# Patient Record
Sex: Female | Born: 1985 | Race: White | Hispanic: No | Marital: Married | Smoking: Never smoker
Health system: Southern US, Community
[De-identification: ages and names within clinical notes are randomized; demographics above are authoritative.]

## PROBLEM LIST (undated history)

## (undated) DIAGNOSIS — Z803 Family history of malignant neoplasm of breast: Secondary | ICD-10-CM

## (undated) DIAGNOSIS — D649 Anemia, unspecified: Secondary | ICD-10-CM

## (undated) DIAGNOSIS — F329 Major depressive disorder, single episode, unspecified: Secondary | ICD-10-CM

## (undated) DIAGNOSIS — J189 Pneumonia, unspecified organism: Secondary | ICD-10-CM

## (undated) DIAGNOSIS — C801 Malignant (primary) neoplasm, unspecified: Secondary | ICD-10-CM

## (undated) DIAGNOSIS — R569 Unspecified convulsions: Secondary | ICD-10-CM

## (undated) DIAGNOSIS — F32A Depression, unspecified: Secondary | ICD-10-CM

## (undated) DIAGNOSIS — R519 Headache, unspecified: Secondary | ICD-10-CM

## (undated) DIAGNOSIS — R091 Pleurisy: Secondary | ICD-10-CM

## (undated) DIAGNOSIS — F419 Anxiety disorder, unspecified: Secondary | ICD-10-CM

## (undated) HISTORY — PX: NO PAST SURGERIES: SHX2092

## (undated) HISTORY — DX: Pleurisy: R09.1

## (undated) HISTORY — PX: BREAST SURGERY: SHX581

## (undated) HISTORY — DX: Family history of malignant neoplasm of breast: Z80.3

---
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
## 2004-12-19 ENCOUNTER — Emergency Department (HOSPITAL_COMMUNITY): Admission: EM | Admit: 2004-12-19 | Discharge: 2004-12-19 | Payer: Self-pay | Admitting: Emergency Medicine

## 2004-12-26 ENCOUNTER — Emergency Department (HOSPITAL_COMMUNITY): Admission: EM | Admit: 2004-12-26 | Discharge: 2004-12-26 | Payer: Self-pay | Admitting: Emergency Medicine

## 2005-03-12 ENCOUNTER — Emergency Department: Payer: Self-pay | Admitting: Emergency Medicine

## 2005-06-03 ENCOUNTER — Emergency Department: Payer: Self-pay | Admitting: Emergency Medicine

## 2005-07-10 ENCOUNTER — Emergency Department: Payer: Self-pay | Admitting: Emergency Medicine

## 2005-07-26 ENCOUNTER — Emergency Department: Payer: Self-pay | Admitting: Emergency Medicine

## 2005-07-26 ENCOUNTER — Other Ambulatory Visit: Payer: Self-pay

## 2005-08-13 ENCOUNTER — Emergency Department: Payer: Self-pay | Admitting: Emergency Medicine

## 2005-08-19 ENCOUNTER — Emergency Department: Payer: Self-pay | Admitting: Emergency Medicine

## 2005-10-09 ENCOUNTER — Emergency Department (HOSPITAL_COMMUNITY): Admission: EM | Admit: 2005-10-09 | Discharge: 2005-10-09 | Payer: Self-pay | Admitting: Emergency Medicine

## 2005-12-02 ENCOUNTER — Other Ambulatory Visit: Payer: Self-pay

## 2005-12-02 ENCOUNTER — Emergency Department: Payer: Self-pay | Admitting: Emergency Medicine

## 2005-12-05 ENCOUNTER — Ambulatory Visit: Payer: Self-pay | Admitting: Emergency Medicine

## 2006-02-05 ENCOUNTER — Emergency Department: Payer: Self-pay | Admitting: Emergency Medicine

## 2006-02-10 ENCOUNTER — Emergency Department: Payer: Self-pay | Admitting: General Practice

## 2006-02-10 ENCOUNTER — Other Ambulatory Visit: Payer: Self-pay

## 2006-04-14 ENCOUNTER — Other Ambulatory Visit: Payer: Self-pay

## 2006-04-14 ENCOUNTER — Emergency Department: Payer: Self-pay | Admitting: Emergency Medicine

## 2006-05-05 ENCOUNTER — Emergency Department: Payer: Self-pay | Admitting: Emergency Medicine

## 2006-05-05 ENCOUNTER — Other Ambulatory Visit: Payer: Self-pay

## 2006-07-10 ENCOUNTER — Other Ambulatory Visit: Payer: Self-pay

## 2006-07-10 ENCOUNTER — Emergency Department: Payer: Self-pay | Admitting: Emergency Medicine

## 2006-08-01 ENCOUNTER — Emergency Department (HOSPITAL_COMMUNITY): Admission: EM | Admit: 2006-08-01 | Discharge: 2006-08-01 | Payer: Self-pay | Admitting: Emergency Medicine

## 2006-08-15 ENCOUNTER — Emergency Department (HOSPITAL_COMMUNITY): Admission: EM | Admit: 2006-08-15 | Discharge: 2006-08-15 | Payer: Self-pay | Admitting: Emergency Medicine

## 2006-08-15 ENCOUNTER — Ambulatory Visit: Payer: Self-pay | Admitting: *Deleted

## 2006-08-15 ENCOUNTER — Ambulatory Visit: Payer: Self-pay | Admitting: Family Medicine

## 2006-08-30 ENCOUNTER — Emergency Department: Payer: Self-pay | Admitting: Emergency Medicine

## 2006-09-01 ENCOUNTER — Ambulatory Visit: Payer: Self-pay | Admitting: Family Medicine

## 2006-09-12 ENCOUNTER — Ambulatory Visit: Payer: Self-pay | Admitting: Family Medicine

## 2006-09-15 ENCOUNTER — Ambulatory Visit (HOSPITAL_COMMUNITY): Admission: RE | Admit: 2006-09-15 | Discharge: 2006-09-15 | Payer: Self-pay | Admitting: Family Medicine

## 2006-09-26 ENCOUNTER — Ambulatory Visit: Payer: Self-pay | Admitting: Family Medicine

## 2006-11-16 ENCOUNTER — Ambulatory Visit: Payer: Self-pay | Admitting: Family Medicine

## 2006-12-11 ENCOUNTER — Ambulatory Visit: Payer: Self-pay | Admitting: Family Medicine

## 2006-12-20 ENCOUNTER — Emergency Department: Payer: Self-pay | Admitting: Emergency Medicine

## 2007-01-01 ENCOUNTER — Ambulatory Visit: Payer: Self-pay | Admitting: Family Medicine

## 2007-01-02 ENCOUNTER — Ambulatory Visit (HOSPITAL_COMMUNITY): Admission: RE | Admit: 2007-01-02 | Discharge: 2007-01-02 | Payer: Self-pay | Admitting: Family Medicine

## 2007-01-16 ENCOUNTER — Ambulatory Visit: Payer: Self-pay | Admitting: Family Medicine

## 2007-02-02 ENCOUNTER — Emergency Department: Payer: Self-pay

## 2007-02-12 ENCOUNTER — Ambulatory Visit: Payer: Self-pay | Admitting: Family Medicine

## 2007-03-13 ENCOUNTER — Ambulatory Visit: Payer: Self-pay | Admitting: Family Medicine

## 2007-06-04 ENCOUNTER — Ambulatory Visit: Payer: Self-pay | Admitting: Internal Medicine

## 2007-07-14 ENCOUNTER — Emergency Department: Payer: Self-pay | Admitting: Emergency Medicine

## 2007-07-17 ENCOUNTER — Ambulatory Visit: Payer: Self-pay | Admitting: Family Medicine

## 2007-07-19 ENCOUNTER — Ambulatory Visit (HOSPITAL_COMMUNITY): Admission: RE | Admit: 2007-07-19 | Discharge: 2007-07-19 | Payer: Self-pay | Admitting: Family Medicine

## 2007-08-15 ENCOUNTER — Encounter (INDEPENDENT_AMBULATORY_CARE_PROVIDER_SITE_OTHER): Payer: Self-pay | Admitting: *Deleted

## 2007-08-27 ENCOUNTER — Emergency Department: Payer: Self-pay | Admitting: Emergency Medicine

## 2007-08-27 ENCOUNTER — Other Ambulatory Visit: Payer: Self-pay

## 2007-12-21 ENCOUNTER — Emergency Department: Payer: Self-pay | Admitting: Internal Medicine

## 2008-01-14 ENCOUNTER — Ambulatory Visit: Payer: Self-pay | Admitting: Internal Medicine

## 2008-02-15 ENCOUNTER — Emergency Department: Payer: Self-pay | Admitting: Emergency Medicine

## 2008-03-04 ENCOUNTER — Emergency Department: Payer: Self-pay | Admitting: Emergency Medicine

## 2008-03-04 ENCOUNTER — Other Ambulatory Visit: Payer: Self-pay

## 2008-03-18 ENCOUNTER — Ambulatory Visit: Payer: Self-pay | Admitting: Family Medicine

## 2008-04-08 ENCOUNTER — Ambulatory Visit: Payer: Self-pay | Admitting: Internal Medicine

## 2008-04-08 ENCOUNTER — Encounter (INDEPENDENT_AMBULATORY_CARE_PROVIDER_SITE_OTHER): Payer: Self-pay | Admitting: Family Medicine

## 2008-04-08 LAB — CONVERTED CEMR LAB
Chlamydia, DNA Probe: NEGATIVE
HCT: 39.2 % (ref 36.0–46.0)
Hemoglobin: 12.9 g/dL (ref 12.0–15.0)
Lymphocytes Relative: 49 % — ABNORMAL HIGH (ref 12–46)
Lymphs Abs: 1.4 10*3/uL (ref 0.7–4.0)
MCV: 90.3 fL (ref 78.0–100.0)
Monocytes Relative: 8 % (ref 3–12)
Neutro Abs: 1.1 10*3/uL — ABNORMAL LOW (ref 1.7–7.7)
Platelets: 205 10*3/uL (ref 150–400)
RBC: 4.34 M/uL (ref 3.87–5.11)
Vit D, 1,25-Dihydroxy: 34 (ref 30–89)

## 2008-04-24 ENCOUNTER — Ambulatory Visit: Payer: Self-pay | Admitting: Internal Medicine

## 2008-08-25 ENCOUNTER — Emergency Department: Payer: Self-pay | Admitting: Emergency Medicine

## 2008-10-26 ENCOUNTER — Emergency Department: Payer: Self-pay

## 2008-11-23 ENCOUNTER — Emergency Department: Payer: Self-pay | Admitting: Emergency Medicine

## 2009-01-01 ENCOUNTER — Emergency Department: Payer: Self-pay | Admitting: Emergency Medicine

## 2009-01-15 ENCOUNTER — Emergency Department: Payer: Self-pay | Admitting: Emergency Medicine

## 2009-06-21 ENCOUNTER — Emergency Department: Payer: Self-pay | Admitting: Emergency Medicine

## 2009-06-22 ENCOUNTER — Encounter: Payer: Self-pay | Admitting: Obstetrics and Gynecology

## 2009-07-31 ENCOUNTER — Emergency Department: Payer: Self-pay | Admitting: Emergency Medicine

## 2009-08-28 ENCOUNTER — Emergency Department: Payer: Self-pay | Admitting: Emergency Medicine

## 2009-09-24 ENCOUNTER — Observation Stay: Payer: Self-pay | Admitting: Obstetrics and Gynecology

## 2009-10-05 ENCOUNTER — Observation Stay: Payer: Self-pay

## 2009-10-20 ENCOUNTER — Encounter: Payer: Self-pay | Admitting: Pediatric Cardiology

## 2009-11-02 ENCOUNTER — Emergency Department: Payer: Self-pay | Admitting: Emergency Medicine

## 2009-11-23 ENCOUNTER — Emergency Department: Payer: Self-pay | Admitting: Emergency Medicine

## 2009-12-26 ENCOUNTER — Observation Stay: Payer: Self-pay

## 2010-01-20 ENCOUNTER — Observation Stay: Payer: Self-pay

## 2010-01-25 ENCOUNTER — Observation Stay: Payer: Self-pay

## 2010-01-30 ENCOUNTER — Inpatient Hospital Stay: Payer: Self-pay | Admitting: Obstetrics & Gynecology

## 2010-06-08 ENCOUNTER — Emergency Department: Payer: Self-pay | Admitting: Emergency Medicine

## 2010-10-13 ENCOUNTER — Emergency Department: Payer: Self-pay | Admitting: Emergency Medicine

## 2011-04-15 NOTE — Procedures (Signed)
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  Seizures were occurring more frequently.  Study is being  done to look for the presence of a seizure disorder.  780.39.   PROCEDURE:  The tracing is carried out of 32 channel digital Cadwell  recorder reformatted into 16 channel montages with one devoted to EKG.  The  patient was sleep deprived for the record, remained awake and drowsy during  the recording.  Medications include Dilantin.   DESCRIPTION/FINDINGS:  Dominant frequency is a 10 Hz 35-40 microvolt  activity that is well regulated and attenuates partially with eye opening.   BACKGROUND ACTIVITY:  Is predominately alpha and theta range activity with  frontally predominant beta range components.   The patient becomes to drowsy with mixed frequency rhythmic theta that at  times is generalized.   Activating procedures with photic stimulation induced driving response  between 11 and 13 Hz.  EKG showed regular sinus rhythm with ventricular  response of 78 beats per minute.   IMPRESSION:  Normal record with the patient awake and drowsy.      Latoya Luna. Sharene Skeans, M.D.  Electronically Signed     ZDG:LOVF  D:  09/15/2006 20:52:19  T:  09/17/2006 10:53:31  Job #:  [id number]   cc:   Maurice March, M.D.  Fax: [phone]
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
## 2011-08-25 ENCOUNTER — Emergency Department: Payer: Self-pay | Admitting: Internal Medicine

## 2011-09-25 ENCOUNTER — Emergency Department: Payer: Self-pay | Admitting: Emergency Medicine

## 2012-02-01 ENCOUNTER — Emergency Department: Payer: Self-pay | Admitting: Emergency Medicine

## 2012-02-13 ENCOUNTER — Emergency Department: Payer: Self-pay | Admitting: Emergency Medicine

## 2012-05-07 ENCOUNTER — Emergency Department: Payer: Self-pay | Admitting: Emergency Medicine

## 2012-05-07 LAB — COMPREHENSIVE METABOLIC PANEL
Albumin: 3.7 g/dL (ref 3.4–5.0)
BUN: 10 mg/dL (ref 7–18)
Bilirubin,Total: 0.2 mg/dL (ref 0.2–1.0)
Calcium, Total: 8.7 mg/dL (ref 8.5–10.1)
Chloride: 104 mmol/L (ref 98–107)
Osmolality: 277 (ref 275–301)
Potassium: 3.3 mmol/L — ABNORMAL LOW (ref 3.5–5.1)
SGOT(AST): 18 U/L (ref 15–37)
Sodium: 140 mmol/L (ref 136–145)
Total Protein: 7 g/dL (ref 6.4–8.2)

## 2012-05-07 LAB — URINALYSIS, COMPLETE
Bacteria: NONE SEEN
Bilirubin,UR: NEGATIVE
Glucose,UR: NEGATIVE mg/dL (ref 0–75)
Leukocyte Esterase: NEGATIVE
Protein: NEGATIVE
RBC,UR: <1 /HPF (ref 0–5)
Squamous Epithelial: 1

## 2012-05-07 LAB — DRUG SCREEN, URINE
Barbiturates, Ur Screen: NEGATIVE (ref ?–200)
Cannabinoid 50 Ng, Ur ~~LOC~~: NEGATIVE (ref ?–50)
Cocaine Metabolite,Ur ~~LOC~~: NEGATIVE (ref ?–300)
MDMA (Ecstasy)Ur Screen: NEGATIVE (ref ?–500)
Methadone, Ur Screen: NEGATIVE (ref ?–300)
Opiate, Ur Screen: NEGATIVE (ref ?–300)

## 2012-05-07 LAB — CBC
HGB: 12.9 g/dL (ref 12.0–16.0)
MCH: 31.2 pg (ref 26.0–34.0)
MCHC: 33.7 g/dL (ref 32.0–36.0)
MCV: 93 fL (ref 80–100)
Platelet: 199 10*3/uL (ref 150–440)
RBC: 4.14 10*6/uL (ref 3.80–5.20)

## 2012-05-07 LAB — WET PREP, GENITAL

## 2012-07-12 ENCOUNTER — Emergency Department: Payer: Self-pay | Admitting: Emergency Medicine

## 2012-07-12 LAB — COMPREHENSIVE METABOLIC PANEL
Albumin: 2.9 g/dL — ABNORMAL LOW (ref 3.4–5.0)
Anion Gap: 8 (ref 7–16)
Calcium, Total: 8.1 mg/dL — ABNORMAL LOW (ref 8.5–10.1)
EGFR (African American): >60
EGFR (Non-African Amer.): >60
Glucose: 73 mg/dL (ref 65–99)
Potassium: 3.6 mmol/L (ref 3.5–5.1)
SGOT(AST): 22 U/L (ref 15–37)
SGPT (ALT): 18 U/L (ref 12–78)

## 2012-07-12 LAB — URINALYSIS, COMPLETE
Ketone: NEGATIVE
Ph: 8 (ref 4.5–8.0)
Protein: NEGATIVE
Specific Gravity: 1.003 (ref 1.003–1.030)

## 2012-07-12 LAB — PROTIME-INR
INR: 1
Prothrombin Time: 13.2 secs (ref 11.5–14.7)

## 2012-07-12 LAB — CBC
HCT: 33.8 % — ABNORMAL LOW (ref 35.0–47.0)
HGB: 11.7 g/dL — ABNORMAL LOW (ref 12.0–16.0)
MCH: 31.9 pg (ref 26.0–34.0)
MCHC: 34.7 g/dL (ref 32.0–36.0)
RBC: 3.67 10*6/uL — ABNORMAL LOW (ref 3.80–5.20)
WBC: 6.3 10*3/uL (ref 3.6–11.0)

## 2012-08-27 ENCOUNTER — Observation Stay: Payer: Self-pay

## 2012-08-27 LAB — URINALYSIS, COMPLETE
Bilirubin,UR: NEGATIVE
Blood: NEGATIVE
Ketone: NEGATIVE
Ph: 8 (ref 4.5–8.0)
Specific Gravity: 1.011 (ref 1.003–1.030)
Squamous Epithelial: 2

## 2012-09-14 ENCOUNTER — Observation Stay: Payer: Self-pay | Admitting: Emergency Medicine

## 2012-09-14 LAB — CBC
HGB: 12.1 g/dL (ref 12.0–16.0)
MCH: 32.9 pg (ref 26.0–34.0)
MCV: 93 fL (ref 80–100)
Platelet: 187 10*3/uL (ref 150–440)
RBC: 3.69 10*6/uL — ABNORMAL LOW (ref 3.80–5.20)
WBC: 6.8 10*3/uL (ref 3.6–11.0)

## 2012-09-14 LAB — URINALYSIS, COMPLETE
Ketone: NEGATIVE
Ph: 8 (ref 4.5–8.0)
Protein: NEGATIVE
RBC,UR: <1 /HPF (ref 0–5)

## 2012-09-14 LAB — COMPREHENSIVE METABOLIC PANEL
Albumin: 2.8 g/dL — ABNORMAL LOW (ref 3.4–5.0)
Alkaline Phosphatase: 59 U/L (ref 50–136)
Bilirubin,Total: 0.2 mg/dL (ref 0.2–1.0)
Calcium, Total: 8.2 mg/dL — ABNORMAL LOW (ref 8.5–10.1)
Co2: 28 mmol/L (ref 21–32)
EGFR (African American): >60
EGFR (Non-African Amer.): >60
Glucose: 73 mg/dL (ref 65–99)
SGOT(AST): 12 U/L — ABNORMAL LOW (ref 15–37)
SGPT (ALT): 19 U/L (ref 12–78)
Sodium: 141 mmol/L (ref 136–145)

## 2012-10-16 ENCOUNTER — Observation Stay: Payer: Self-pay

## 2012-11-22 ENCOUNTER — Observation Stay: Payer: Self-pay | Admitting: Emergency Medicine

## 2012-11-26 ENCOUNTER — Observation Stay: Payer: Self-pay | Admitting: Obstetrics and Gynecology

## 2012-11-29 ENCOUNTER — Observation Stay: Payer: Self-pay | Admitting: Advanced Practice Midwife

## 2012-12-04 ENCOUNTER — Inpatient Hospital Stay: Payer: Self-pay | Admitting: Obstetrics and Gynecology

## 2012-12-04 LAB — CBC WITH DIFFERENTIAL/PLATELET
Eosinophil #: 0.1 10*3/uL (ref 0.0–0.7)
Eosinophil %: 0.9 %
Lymphocyte %: 17.4 %
MCV: 90 fL (ref 80–100)
Monocyte %: 7.7 %
Neutrophil %: 73.6 %
Platelet: 175 10*3/uL (ref 150–440)

## 2012-12-05 LAB — HEMATOCRIT: HCT: 30.8 % — ABNORMAL LOW (ref 35.0–47.0)

## 2013-09-26 ENCOUNTER — Emergency Department: Payer: Self-pay | Admitting: Emergency Medicine

## 2013-09-26 LAB — CBC
HCT: 38.5 % (ref 35.0–47.0)
HGB: 13.3 g/dL (ref 12.0–16.0)
Platelet: 198 10*3/uL (ref 150–440)
RBC: 4.27 10*6/uL (ref 3.80–5.20)

## 2013-09-26 LAB — BASIC METABOLIC PANEL
BUN: 14 mg/dL (ref 7–18)
Osmolality: 277 (ref 275–301)

## 2013-10-09 ENCOUNTER — Emergency Department: Payer: Self-pay

## 2013-10-09 LAB — BASIC METABOLIC PANEL
BUN: 16 mg/dL (ref 7–18)
Calcium, Total: 8.8 mg/dL (ref 8.5–10.1)
Chloride: 104 mmol/L (ref 98–107)
Creatinine: 0.59 mg/dL — ABNORMAL LOW (ref 0.60–1.30)
Glucose: 120 mg/dL — ABNORMAL HIGH (ref 65–99)
Sodium: 136 mmol/L (ref 136–145)

## 2013-10-09 LAB — CBC WITH DIFFERENTIAL/PLATELET
Eosinophil %: 1.6 %
HCT: 38.3 % (ref 35.0–47.0)
HGB: 13 g/dL (ref 12.0–16.0)
Lymphocyte %: 9.1 %
MCV: 91 fL (ref 80–100)
Monocyte #: 0.7 x10 3/mm (ref 0.2–0.9)
Monocyte %: 6 %
Neutrophil #: 9.9 10*3/uL — ABNORMAL HIGH (ref 1.4–6.5)
Neutrophil %: 83 %
WBC: 11.9 10*3/uL — ABNORMAL HIGH (ref 3.6–11.0)

## 2013-11-02 ENCOUNTER — Emergency Department: Payer: Self-pay | Admitting: Emergency Medicine

## 2013-11-02 LAB — COMPREHENSIVE METABOLIC PANEL
Albumin: 3.9 g/dL (ref 3.4–5.0)
Anion Gap: 6 — ABNORMAL LOW (ref 7–16)
BUN: 14 mg/dL (ref 7–18)
Bilirubin,Total: 0.3 mg/dL (ref 0.2–1.0)
Calcium, Total: 8.9 mg/dL (ref 8.5–10.1)
Glucose: 85 mg/dL (ref 65–99)
Potassium: 3.8 mmol/L (ref 3.5–5.1)
SGOT(AST): 12 U/L — ABNORMAL LOW (ref 15–37)
SGPT (ALT): 23 U/L (ref 12–78)

## 2013-11-02 LAB — URINALYSIS, COMPLETE
Bilirubin,UR: NEGATIVE
Glucose,UR: NEGATIVE mg/dL (ref 0–75)
Ketone: NEGATIVE
Nitrite: NEGATIVE
Ph: 7 (ref 4.5–8.0)
Specific Gravity: 1.018 (ref 1.003–1.030)
WBC UR: NONE SEEN /HPF (ref 0–5)

## 2013-11-02 LAB — CBC
HGB: 12.9 g/dL (ref 12.0–16.0)
MCH: 30.7 pg (ref 26.0–34.0)
MCV: 90 fL (ref 80–100)
RBC: 4.22 10*6/uL (ref 3.80–5.20)
RDW: 12.6 % (ref 11.5–14.5)
WBC: 5.3 10*3/uL (ref 3.6–11.0)

## 2014-02-27 ENCOUNTER — Emergency Department: Payer: Self-pay | Admitting: Emergency Medicine

## 2014-02-27 LAB — URINALYSIS, COMPLETE
BACTERIA: NONE SEEN
BILIRUBIN, UR: NEGATIVE
BLOOD: NEGATIVE
Glucose,UR: NEGATIVE mg/dL (ref 0–75)
Ketone: NEGATIVE
Leukocyte Esterase: NEGATIVE
Nitrite: NEGATIVE
Ph: 8 (ref 4.5–8.0)
Protein: NEGATIVE
Specific Gravity: 1.01 (ref 1.003–1.030)
WBC UR: 1 /HPF (ref 0–5)

## 2014-02-27 LAB — BASIC METABOLIC PANEL
Anion Gap: 7 (ref 7–16)
BUN: 9 mg/dL (ref 7–18)
CALCIUM: 8.7 mg/dL (ref 8.5–10.1)
CREATININE: 0.55 mg/dL — AB (ref 0.60–1.30)
Chloride: 105 mmol/L (ref 98–107)
Co2: 28 mmol/L (ref 21–32)
GLUCOSE: 100 mg/dL — AB (ref 65–99)
OSMOLALITY: 278 (ref 275–301)
POTASSIUM: 3.8 mmol/L (ref 3.5–5.1)
SODIUM: 140 mmol/L (ref 136–145)

## 2014-02-27 LAB — CBC WITH DIFFERENTIAL/PLATELET
Basophil #: 0 10*3/uL (ref 0.0–0.1)
Basophil %: 0.6 %
EOS ABS: 0.1 10*3/uL (ref 0.0–0.7)
Eosinophil %: 0.9 %
HCT: 39.7 % (ref 35.0–47.0)
HGB: 13.2 g/dL (ref 12.0–16.0)
Lymphocyte #: 1.3 10*3/uL (ref 1.0–3.6)
Lymphocyte %: 23.1 %
MCH: 30.5 pg (ref 26.0–34.0)
MCHC: 33.3 g/dL (ref 32.0–36.0)
MCV: 92 fL (ref 80–100)
MONO ABS: 0.4 x10 3/mm (ref 0.2–0.9)
MONOS PCT: 6.3 %
Neutrophil #: 3.9 10*3/uL (ref 1.4–6.5)
Neutrophil %: 69.1 %
Platelet: 184 10*3/uL (ref 150–440)
RBC: 4.34 10*6/uL (ref 3.80–5.20)
RDW: 13.3 % (ref 11.5–14.5)
WBC: 5.7 10*3/uL (ref 3.6–11.0)

## 2014-03-29 LAB — OB RESULTS CONSOLE GBS: GBS: POSITIVE

## 2014-08-20 ENCOUNTER — Emergency Department: Payer: Self-pay | Admitting: Emergency Medicine

## 2014-08-20 LAB — COMPREHENSIVE METABOLIC PANEL
ALT: 26 U/L
ANION GAP: 7 (ref 7–16)
Albumin: 3.9 g/dL (ref 3.4–5.0)
Alkaline Phosphatase: 41 U/L — ABNORMAL LOW
BILIRUBIN TOTAL: 0.4 mg/dL (ref 0.2–1.0)
BUN: 13 mg/dL (ref 7–18)
Calcium, Total: 8.4 mg/dL — ABNORMAL LOW (ref 8.5–10.1)
Chloride: 104 mmol/L (ref 98–107)
Co2: 27 mmol/L (ref 21–32)
Creatinine: 0.58 mg/dL — ABNORMAL LOW (ref 0.60–1.30)
EGFR (African American): >60
EGFR (Non-African Amer.): >60
GLUCOSE: 77 mg/dL (ref 65–99)
OSMOLALITY: 275 (ref 275–301)
POTASSIUM: 3.6 mmol/L (ref 3.5–5.1)
SGOT(AST): 20 U/L (ref 15–37)
Sodium: 138 mmol/L (ref 136–145)
Total Protein: 7.2 g/dL (ref 6.4–8.2)

## 2014-08-20 LAB — URINALYSIS, COMPLETE
BACTERIA: NONE SEEN
BILIRUBIN, UR: NEGATIVE
Blood: NEGATIVE
GLUCOSE, UR: NEGATIVE mg/dL (ref 0–75)
KETONE: NEGATIVE
LEUKOCYTE ESTERASE: NEGATIVE
Nitrite: NEGATIVE
PROTEIN: NEGATIVE
Ph: 7 (ref 4.5–8.0)
Specific Gravity: 1.004 (ref 1.003–1.030)
Squamous Epithelial: <1
WBC UR: <1 /HPF (ref 0–5)

## 2014-08-20 LAB — CBC
HCT: 38.8 % (ref 35.0–47.0)
HGB: 13 g/dL (ref 12.0–16.0)
MCH: 31 pg (ref 26.0–34.0)
MCHC: 33.6 g/dL (ref 32.0–36.0)
MCV: 92 fL (ref 80–100)
PLATELETS: 200 10*3/uL (ref 150–440)
RBC: 4.21 10*6/uL (ref 3.80–5.20)
RDW: 12.7 % (ref 11.5–14.5)
WBC: 6.8 10*3/uL (ref 3.6–11.0)

## 2014-08-20 LAB — WET PREP, GENITAL

## 2014-08-20 LAB — LIPASE, BLOOD: Lipase: 115 U/L (ref 73–393)

## 2014-08-20 LAB — HCG, QUANTITATIVE, PREGNANCY: Beta Hcg, Quant.: 3330 m[IU]/mL — ABNORMAL HIGH

## 2014-08-20 LAB — PREGNANCY, URINE: Pregnancy Test, Urine: POSITIVE m[IU]/mL

## 2014-08-29 LAB — OB RESULTS CONSOLE GBS: GBS: NEGATIVE

## 2014-09-04 DIAGNOSIS — Z87898 Personal history of other specified conditions: Secondary | ICD-10-CM | POA: Insufficient documentation

## 2014-09-04 DIAGNOSIS — Z8669 Personal history of other diseases of the nervous system and sense organs: Secondary | ICD-10-CM | POA: Insufficient documentation

## 2014-09-26 LAB — OB RESULTS CONSOLE RPR: RPR: NONREACTIVE

## 2014-09-26 LAB — OB RESULTS CONSOLE ABO/RH: RH Type: POSITIVE

## 2014-09-26 LAB — OB RESULTS CONSOLE HIV ANTIBODY (ROUTINE TESTING): HIV: NONREACTIVE

## 2014-09-26 LAB — OB RESULTS CONSOLE RUBELLA ANTIBODY, IGM: Rubella: IMMUNE

## 2014-09-26 LAB — OB RESULTS CONSOLE GC/CHLAMYDIA
Chlamydia: NEGATIVE
Gonorrhea: NEGATIVE

## 2014-09-26 LAB — OB RESULTS CONSOLE ANTIBODY SCREEN: Antibody Screen: NEGATIVE

## 2014-09-26 LAB — OB RESULTS CONSOLE VARICELLA ZOSTER ANTIBODY, IGG: VARICELLA IGG: IMMUNE

## 2014-09-26 LAB — OB RESULTS CONSOLE HEPATITIS B SURFACE ANTIGEN: Hepatitis B Surface Ag: NEGATIVE

## 2014-09-27 ENCOUNTER — Observation Stay: Payer: Self-pay | Admitting: Obstetrics and Gynecology

## 2014-09-27 LAB — BASIC METABOLIC PANEL
Anion Gap: 7 (ref 7–16)
BUN: 10 mg/dL (ref 7–18)
CALCIUM: 8.1 mg/dL — AB (ref 8.5–10.1)
Chloride: 108 mmol/L — ABNORMAL HIGH (ref 98–107)
Co2: 26 mmol/L (ref 21–32)
Creatinine: 0.4 mg/dL — ABNORMAL LOW (ref 0.60–1.30)
EGFR (African American): >60
EGFR (Non-African Amer.): >60
GLUCOSE: 75 mg/dL (ref 65–99)
Osmolality: 279 (ref 275–301)
Potassium: 3.8 mmol/L (ref 3.5–5.1)
Sodium: 141 mmol/L (ref 136–145)

## 2014-09-27 LAB — CBC WITH DIFFERENTIAL/PLATELET
BASOS PCT: 0.2 %
Basophil #: 0 10*3/uL (ref 0.0–0.1)
EOS ABS: 0 10*3/uL (ref 0.0–0.7)
Eosinophil %: 0.5 %
HCT: 36.9 % (ref 35.0–47.0)
HGB: 12.4 g/dL (ref 12.0–16.0)
Lymphocyte #: 1 10*3/uL (ref 1.0–3.6)
Lymphocyte %: 14.6 %
MCH: 31.8 pg (ref 26.0–34.0)
MCHC: 33.7 g/dL (ref 32.0–36.0)
MCV: 94 fL (ref 80–100)
MONO ABS: 0.5 x10 3/mm (ref 0.2–0.9)
Monocyte %: 6.6 %
Neutrophil #: 5.4 10*3/uL (ref 1.4–6.5)
Neutrophil %: 78.1 %
Platelet: 208 10*3/uL (ref 150–440)
RBC: 3.91 10*6/uL (ref 3.80–5.20)
RDW: 13.5 % (ref 11.5–14.5)
WBC: 6.9 10*3/uL (ref 3.6–11.0)

## 2014-09-27 LAB — URINALYSIS, COMPLETE
BILIRUBIN, UR: NEGATIVE
Bacteria: NONE SEEN
Blood: NEGATIVE
GLUCOSE, UR: NEGATIVE mg/dL (ref 0–75)
KETONE: NEGATIVE
Leukocyte Esterase: NEGATIVE
Nitrite: NEGATIVE
Ph: 7 (ref 4.5–8.0)
Protein: NEGATIVE
RBC,UR: NONE SEEN /HPF (ref 0–5)
Specific Gravity: 1.005 (ref 1.003–1.030)
WBC UR: NONE SEEN /HPF (ref 0–5)

## 2014-09-27 LAB — TROPONIN I: Troponin-I: <0.02 ng/mL

## 2014-09-27 LAB — HCG, QUANTITATIVE, PREGNANCY: Beta Hcg, Quant.: 186313 m[IU]/mL — ABNORMAL HIGH

## 2014-10-12 ENCOUNTER — Emergency Department: Payer: Self-pay | Admitting: Emergency Medicine

## 2014-10-12 LAB — CBC
HCT: 35.6 % (ref 35.0–47.0)
HGB: 12.2 g/dL (ref 12.0–16.0)
MCH: 32.1 pg (ref 26.0–34.0)
MCHC: 34.4 g/dL (ref 32.0–36.0)
MCV: 93 fL (ref 80–100)
Platelet: 218 10*3/uL (ref 150–440)
RBC: 3.81 10*6/uL (ref 3.80–5.20)
RDW: 13.3 % (ref 11.5–14.5)
WBC: 7.4 10*3/uL (ref 3.6–11.0)

## 2014-10-12 LAB — HCG, QUANTITATIVE, PREGNANCY: Beta Hcg, Quant.: 140597 m[IU]/mL — ABNORMAL HIGH

## 2014-10-13 ENCOUNTER — Encounter: Payer: Self-pay | Admitting: Obstetrics and Gynecology

## 2014-10-18 ENCOUNTER — Emergency Department: Payer: Self-pay | Admitting: Emergency Medicine

## 2014-10-18 LAB — URINALYSIS, COMPLETE
BILIRUBIN, UR: NEGATIVE
Bacteria: NONE SEEN
Blood: NEGATIVE
GLUCOSE, UR: NEGATIVE mg/dL (ref 0–75)
Ketone: NEGATIVE
Leukocyte Esterase: NEGATIVE
NITRITE: NEGATIVE
PROTEIN: NEGATIVE
Ph: 7 (ref 4.5–8.0)
RBC,UR: NONE SEEN /HPF (ref 0–5)
Specific Gravity: 1.009 (ref 1.003–1.030)
Squamous Epithelial: 6
WBC UR: 1 /HPF (ref 0–5)

## 2014-10-18 LAB — COMPREHENSIVE METABOLIC PANEL
AST: 17 U/L (ref 15–37)
Albumin: 3 g/dL — ABNORMAL LOW (ref 3.4–5.0)
Alkaline Phosphatase: 38 U/L — ABNORMAL LOW
Anion Gap: 8 (ref 7–16)
BUN: 11 mg/dL (ref 7–18)
Bilirubin,Total: 0.2 mg/dL (ref 0.2–1.0)
CHLORIDE: 107 mmol/L (ref 98–107)
CREATININE: 0.55 mg/dL — AB (ref 0.60–1.30)
Calcium, Total: 8.2 mg/dL — ABNORMAL LOW (ref 8.5–10.1)
Co2: 26 mmol/L (ref 21–32)
EGFR (African American): >60
EGFR (Non-African Amer.): >60
GLUCOSE: 85 mg/dL (ref 65–99)
OSMOLALITY: 280 (ref 275–301)
POTASSIUM: 3.6 mmol/L (ref 3.5–5.1)
SGPT (ALT): 19 U/L
Sodium: 141 mmol/L (ref 136–145)
Total Protein: 6.5 g/dL (ref 6.4–8.2)

## 2014-10-18 LAB — CBC WITH DIFFERENTIAL/PLATELET
BASOS ABS: 0 10*3/uL (ref 0.0–0.1)
BASOS PCT: 0.4 %
Eosinophil #: 0.1 10*3/uL (ref 0.0–0.7)
Eosinophil %: 0.9 %
HCT: 33.8 % — AB (ref 35.0–47.0)
HGB: 11.5 g/dL — ABNORMAL LOW (ref 12.0–16.0)
LYMPHS PCT: 22.7 %
Lymphocyte #: 1.4 10*3/uL (ref 1.0–3.6)
MCH: 31.9 pg (ref 26.0–34.0)
MCHC: 34.1 g/dL (ref 32.0–36.0)
MCV: 94 fL (ref 80–100)
MONO ABS: 0.5 x10 3/mm (ref 0.2–0.9)
Monocyte %: 7.9 %
NEUTROS ABS: 4.3 10*3/uL (ref 1.4–6.5)
Neutrophil %: 68.1 %
PLATELETS: 208 10*3/uL (ref 150–440)
RBC: 3.61 10*6/uL — ABNORMAL LOW (ref 3.80–5.20)
RDW: 13.4 % (ref 11.5–14.5)
WBC: 6.2 10*3/uL (ref 3.6–11.0)

## 2014-10-18 LAB — PROTIME-INR
INR: 1
PROTHROMBIN TIME: 12.7 s (ref 11.5–14.7)

## 2014-10-18 LAB — HCG, QUANTITATIVE, PREGNANCY: Beta Hcg, Quant.: 113109 m[IU]/mL — ABNORMAL HIGH

## 2014-10-18 LAB — CK TOTAL AND CKMB (NOT AT ARMC)
CK, Total: 71 U/L (ref 26–192)
CK-MB: 1.1 ng/mL (ref 0.5–3.6)

## 2014-10-18 LAB — TROPONIN I

## 2014-10-24 ENCOUNTER — Emergency Department: Payer: Self-pay | Admitting: Emergency Medicine

## 2014-10-24 LAB — HCG, QUANTITATIVE, PREGNANCY: Beta Hcg, Quant.: 93361 m[IU]/mL — ABNORMAL HIGH

## 2014-10-24 LAB — BASIC METABOLIC PANEL
Anion Gap: 7 (ref 7–16)
BUN: 4 mg/dL — AB (ref 7–18)
CHLORIDE: 106 mmol/L (ref 98–107)
Calcium, Total: 8 mg/dL — ABNORMAL LOW (ref 8.5–10.1)
Co2: 24 mmol/L (ref 21–32)
Creatinine: 0.36 mg/dL — ABNORMAL LOW (ref 0.60–1.30)
EGFR (African American): >60
Glucose: 82 mg/dL (ref 65–99)
Osmolality: 270 (ref 275–301)
POTASSIUM: 3.4 mmol/L — AB (ref 3.5–5.1)
SODIUM: 137 mmol/L (ref 136–145)

## 2014-10-24 LAB — URINALYSIS, COMPLETE
BACTERIA: NONE SEEN
BILIRUBIN, UR: NEGATIVE
Blood: NEGATIVE
Glucose,UR: NEGATIVE mg/dL (ref 0–75)
Ketone: NEGATIVE
NITRITE: NEGATIVE
PROTEIN: NEGATIVE
Ph: 7 (ref 4.5–8.0)
RBC,UR: 3 /HPF (ref 0–5)
Specific Gravity: 1.004 (ref 1.003–1.030)
WBC UR: 2 /HPF (ref 0–5)

## 2014-10-24 LAB — CBC WITH DIFFERENTIAL/PLATELET
Basophil #: 0 10*3/uL (ref 0.0–0.1)
Basophil %: 0.5 %
EOS ABS: 0 10*3/uL (ref 0.0–0.7)
Eosinophil %: 0.4 %
HCT: 35.9 % (ref 35.0–47.0)
HGB: 12.1 g/dL (ref 12.0–16.0)
Lymphocyte #: 1.2 10*3/uL (ref 1.0–3.6)
Lymphocyte %: 17.4 %
MCH: 31.9 pg (ref 26.0–34.0)
MCHC: 33.8 g/dL (ref 32.0–36.0)
MCV: 95 fL (ref 80–100)
MONO ABS: 0.4 x10 3/mm (ref 0.2–0.9)
MONOS PCT: 6.3 %
NEUTROS ABS: 5 10*3/uL (ref 1.4–6.5)
NEUTROS PCT: 75.4 %
PLATELETS: 210 10*3/uL (ref 150–440)
RBC: 3.8 10*6/uL (ref 3.80–5.20)
RDW: 13.2 % (ref 11.5–14.5)
WBC: 6.7 10*3/uL (ref 3.6–11.0)

## 2014-10-24 LAB — TROPONIN I: Troponin-I: <0.02 ng/mL

## 2014-11-03 ENCOUNTER — Emergency Department: Payer: Self-pay | Admitting: Emergency Medicine

## 2014-11-03 LAB — URINALYSIS, COMPLETE
Bacteria: NONE SEEN
Bilirubin,UR: NEGATIVE
Blood: NEGATIVE
Glucose,UR: NEGATIVE mg/dL (ref 0–75)
KETONE: NEGATIVE
Nitrite: NEGATIVE
Ph: 9 (ref 4.5–8.0)
Protein: NEGATIVE
RBC,UR: 1 /HPF (ref 0–5)
Specific Gravity: 1.004 (ref 1.003–1.030)

## 2014-11-03 LAB — CBC
HCT: 35 % (ref 35.0–47.0)
HGB: 12.2 g/dL (ref 12.0–16.0)
MCH: 32.5 pg (ref 26.0–34.0)
MCHC: 34.8 g/dL (ref 32.0–36.0)
MCV: 93 fL (ref 80–100)
PLATELETS: 210 10*3/uL (ref 150–440)
RBC: 3.75 10*6/uL — ABNORMAL LOW (ref 3.80–5.20)
RDW: 13.5 % (ref 11.5–14.5)
WBC: 5.2 10*3/uL (ref 3.6–11.0)

## 2014-11-03 LAB — COMPREHENSIVE METABOLIC PANEL
Albumin: 2.9 g/dL — ABNORMAL LOW (ref 3.4–5.0)
Alkaline Phosphatase: 37 U/L — ABNORMAL LOW
Anion Gap: 10 (ref 7–16)
BUN: 5 mg/dL — ABNORMAL LOW (ref 7–18)
Bilirubin,Total: 0.4 mg/dL (ref 0.2–1.0)
CHLORIDE: 106 mmol/L (ref 98–107)
Calcium, Total: 7.7 mg/dL — ABNORMAL LOW (ref 8.5–10.1)
Co2: 22 mmol/L (ref 21–32)
Creatinine: 0.38 mg/dL — ABNORMAL LOW (ref 0.60–1.30)
EGFR (African American): >60
EGFR (Non-African Amer.): >60
Glucose: 75 mg/dL (ref 65–99)
Osmolality: 272 (ref 275–301)
Potassium: 3.6 mmol/L (ref 3.5–5.1)
SGOT(AST): 20 U/L (ref 15–37)
SGPT (ALT): 18 U/L
SODIUM: 138 mmol/L (ref 136–145)
Total Protein: 6.4 g/dL (ref 6.4–8.2)

## 2014-11-04 DIAGNOSIS — R55 Syncope and collapse: Secondary | ICD-10-CM | POA: Insufficient documentation

## 2014-11-05 LAB — OB RESULTS CONSOLE GBS: GBS: POSITIVE

## 2014-11-13 ENCOUNTER — Emergency Department: Payer: Self-pay | Admitting: Student

## 2014-11-13 LAB — COMPREHENSIVE METABOLIC PANEL
ALBUMIN: 2.8 g/dL — AB (ref 3.4–5.0)
ALT: 17 U/L
Alkaline Phosphatase: 39 U/L — ABNORMAL LOW
Anion Gap: 9 (ref 7–16)
BUN: 8 mg/dL (ref 7–18)
Bilirubin,Total: 0.3 mg/dL (ref 0.2–1.0)
Calcium, Total: 7.9 mg/dL — ABNORMAL LOW (ref 8.5–10.1)
Chloride: 105 mmol/L (ref 98–107)
Co2: 23 mmol/L (ref 21–32)
Creatinine: 0.34 mg/dL — ABNORMAL LOW (ref 0.60–1.30)
EGFR (African American): >60
Glucose: 75 mg/dL (ref 65–99)
OSMOLALITY: 271 (ref 275–301)
Potassium: 3.5 mmol/L (ref 3.5–5.1)
SGOT(AST): 21 U/L (ref 15–37)
Sodium: 137 mmol/L (ref 136–145)
Total Protein: 6.4 g/dL (ref 6.4–8.2)

## 2014-11-13 LAB — CBC
HCT: 35.1 % (ref 35.0–47.0)
HGB: 11.9 g/dL — ABNORMAL LOW (ref 12.0–16.0)
MCH: 31.9 pg (ref 26.0–34.0)
MCHC: 34 g/dL (ref 32.0–36.0)
MCV: 94 fL (ref 80–100)
Platelet: 206 10*3/uL (ref 150–440)
RBC: 3.73 10*6/uL — AB (ref 3.80–5.20)
RDW: 13.5 % (ref 11.5–14.5)
WBC: 7.9 10*3/uL (ref 3.6–11.0)

## 2014-11-13 LAB — URINALYSIS, COMPLETE
BLOOD: NEGATIVE
Bacteria: NONE SEEN
Bilirubin,UR: NEGATIVE
GLUCOSE, UR: NEGATIVE mg/dL (ref 0–75)
Leukocyte Esterase: NEGATIVE
Nitrite: NEGATIVE
PH: 8 (ref 4.5–8.0)
Protein: NEGATIVE
Specific Gravity: 1.004 (ref 1.003–1.030)
Squamous Epithelial: 7

## 2014-11-13 LAB — TROPONIN I: Troponin-I: <0.02 ng/mL

## 2014-11-14 ENCOUNTER — Emergency Department: Payer: Self-pay | Admitting: Emergency Medicine

## 2014-11-26 DIAGNOSIS — B009 Herpesviral infection, unspecified: Secondary | ICD-10-CM | POA: Insufficient documentation

## 2014-11-28 NOTE — L&D Delivery Note (Signed)
Delivery Note At 8:05 PM a viable female was delivered via Vaginal, Spontaneous Delivery (Presentation: Middle Occiput Anterior).  APGAR: 7, 9; weight 7 lb 8.5 oz (3416 g).  Baby to mom's abd for skin to skin. After a few minutes, baby was crying but, purple in color. To the warmer for neonatal NP assessment. Back to mon within 5 mins. Bonding with mom.  Placenta status: Intact, Spontaneous.  Cord: 3 vessels with the following complications: None.   Anesthesia: Local 1% Episiotomy: None Lacerations: 1st degree Suture Repair: 3.0 chromic Est. Blood Loss (mL): 200  Mom to recovering here for the first hour.Randel Books to Couplet care / Skin to Skin.  [name] 04/09/2015, 8:47 PM

## 2014-12-01 ENCOUNTER — Encounter
Admit: 2014-12-01 | Disposition: A | Discharge status: Home / Self Care | Payer: Self-pay | Admitting: Obstetrics and Gynecology

## 2014-12-08 ENCOUNTER — Encounter
Admit: 2014-12-08 | Disposition: A | Discharge status: Home / Self Care | Payer: Self-pay | Admitting: Maternal & Fetal Medicine

## 2014-12-09 ENCOUNTER — Ambulatory Visit: Payer: Self-pay | Admitting: Cardiology

## 2014-12-17 ENCOUNTER — Observation Stay: Payer: Self-pay | Admitting: Student

## 2014-12-17 LAB — CBC WITH DIFFERENTIAL/PLATELET
BASOS PCT: 0.4 %
Basophil #: 0 10*3/uL (ref 0.0–0.1)
Eosinophil #: 0 10*3/uL (ref 0.0–0.7)
Eosinophil %: 0.6 %
HCT: 34.8 % — ABNORMAL LOW (ref 35.0–47.0)
HGB: 11.9 g/dL — ABNORMAL LOW (ref 12.0–16.0)
LYMPHS ABS: 1.2 10*3/uL (ref 1.0–3.6)
Lymphocyte %: 17.2 %
MCH: 32.6 pg (ref 26.0–34.0)
MCHC: 34.2 g/dL (ref 32.0–36.0)
MCV: 95 fL (ref 80–100)
MONO ABS: 0.4 x10 3/mm (ref 0.2–0.9)
Monocyte %: 6.4 %
Neutrophil #: 5.2 10*3/uL (ref 1.4–6.5)
Neutrophil %: 75.4 %
PLATELETS: 211 10*3/uL (ref 150–440)
RBC: 3.65 10*6/uL — ABNORMAL LOW (ref 3.80–5.20)
RDW: 13 % (ref 11.5–14.5)
WBC: 7 10*3/uL (ref 3.6–11.0)

## 2014-12-17 LAB — URINALYSIS, COMPLETE
BLOOD: NEGATIVE
Bilirubin,UR: NEGATIVE
Glucose,UR: NEGATIVE mg/dL (ref 0–75)
Ketone: NEGATIVE
NITRITE: NEGATIVE
PROTEIN: NEGATIVE
Ph: 8 (ref 4.5–8.0)
RBC,UR: NONE SEEN /HPF (ref 0–5)
SPECIFIC GRAVITY: 1.006 (ref 1.003–1.030)
Squamous Epithelial: 20
WBC UR: 4 /HPF (ref 0–5)

## 2014-12-17 LAB — BASIC METABOLIC PANEL
Anion Gap: 6 — ABNORMAL LOW (ref 7–16)
BUN: 7 mg/dL (ref 7–18)
CREATININE: 0.33 mg/dL — AB (ref 0.60–1.30)
Calcium, Total: 7.8 mg/dL — ABNORMAL LOW (ref 8.5–10.1)
Chloride: 108 mmol/L — ABNORMAL HIGH (ref 98–107)
Co2: 25 mmol/L (ref 21–32)
GLUCOSE: 68 mg/dL (ref 65–99)
Osmolality: 274 (ref 275–301)
Potassium: 3.8 mmol/L (ref 3.5–5.1)
Sodium: 139 mmol/L (ref 136–145)

## 2014-12-19 LAB — URINE CULTURE

## 2015-01-01 ENCOUNTER — Emergency Department: Payer: Self-pay | Admitting: Emergency Medicine

## 2015-01-01 LAB — COMPREHENSIVE METABOLIC PANEL
ALBUMIN: 2.5 g/dL — AB (ref 3.4–5.0)
AST: 23 U/L (ref 15–37)
Alkaline Phosphatase: 53 U/L (ref 46–116)
Anion Gap: 7 (ref 7–16)
BUN: 8 mg/dL (ref 7–18)
Bilirubin,Total: 0.2 mg/dL (ref 0.2–1.0)
CHLORIDE: 107 mmol/L (ref 98–107)
CO2: 25 mmol/L (ref 21–32)
Calcium, Total: 7.8 mg/dL — ABNORMAL LOW (ref 8.5–10.1)
Creatinine: 0.43 mg/dL — ABNORMAL LOW (ref 0.60–1.30)
GLUCOSE: 68 mg/dL (ref 65–99)
Osmolality: 274 (ref 275–301)
Potassium: 3.6 mmol/L (ref 3.5–5.1)
SGPT (ALT): 20 U/L (ref 14–63)
SODIUM: 139 mmol/L (ref 136–145)
Total Protein: 6.4 g/dL (ref 6.4–8.2)

## 2015-01-01 LAB — CBC
HCT: 34 % — ABNORMAL LOW (ref 35.0–47.0)
HGB: 11.4 g/dL — AB (ref 12.0–16.0)
MCH: 31.6 pg (ref 26.0–34.0)
MCHC: 33.5 g/dL (ref 32.0–36.0)
MCV: 94 fL (ref 80–100)
Platelet: 224 10*3/uL (ref 150–440)
RBC: 3.6 10*6/uL — ABNORMAL LOW (ref 3.80–5.20)
RDW: 12.9 % (ref 11.5–14.5)
WBC: 6.3 10*3/uL (ref 3.6–11.0)

## 2015-01-01 LAB — HCG, QUANTITATIVE, PREGNANCY: Beta Hcg, Quant.: 22742 m[IU]/mL — ABNORMAL HIGH

## 2015-01-01 LAB — URINALYSIS, COMPLETE
BLOOD: NEGATIVE
Bacteria: NONE SEEN
Bilirubin,UR: NEGATIVE
GLUCOSE, UR: NEGATIVE mg/dL (ref 0–75)
Ketone: NEGATIVE
NITRITE: NEGATIVE
PH: 8 (ref 4.5–8.0)
Protein: NEGATIVE
RBC,UR: 1 /HPF (ref 0–5)
SPECIFIC GRAVITY: 1.009 (ref 1.003–1.030)
Squamous Epithelial: 3
WBC UR: 2 /HPF (ref 0–5)

## 2015-01-01 LAB — TROPONIN I

## 2015-01-01 LAB — CK TOTAL AND CKMB (NOT AT ARMC)
CK, TOTAL: 75 U/L (ref 26–192)
CK-MB: 1.4 ng/mL (ref 0.5–3.6)

## 2015-01-05 ENCOUNTER — Encounter: Payer: Self-pay | Admitting: Obstetrics & Gynecology

## 2015-01-15 ENCOUNTER — Observation Stay: Payer: Self-pay | Admitting: Emergency Medicine

## 2015-02-03 ENCOUNTER — Observation Stay: Payer: Self-pay | Admitting: Obstetrics and Gynecology

## 2015-02-08 ENCOUNTER — Observation Stay: Payer: Self-pay | Admitting: Obstetrics and Gynecology

## 2015-02-08 IMAGING — US US OB < 14 WEEKS
1 series · 14 of 28 positions shown · non-contrast
Comparison: August 20, 2014

CLINICAL DATA: Repeated syncope; unable to hear fetal heart tones

EXAM:
OBSTETRIC <14 WK ULTRASOUND
TECHNIQUE: Transabdominal ultrasound was performed for evaluation of the
gestation as well as the maternal uterus and adnexal regions.

[Series 1: us ob < 14 weeks · 0.18mm/px · 14 of 53 slices shown]
[im 2/53]
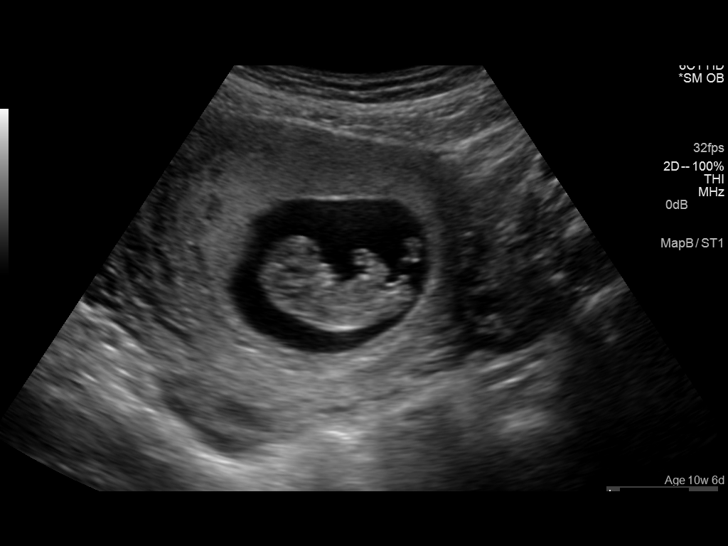
[im 6/53]
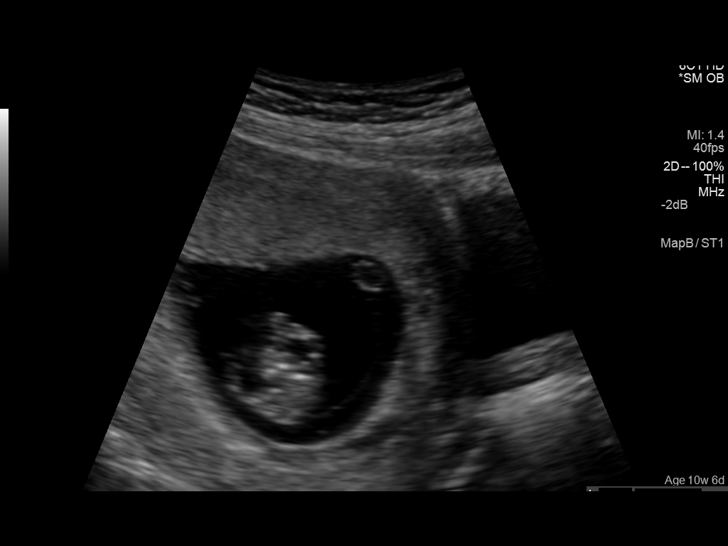
[im 10/53]
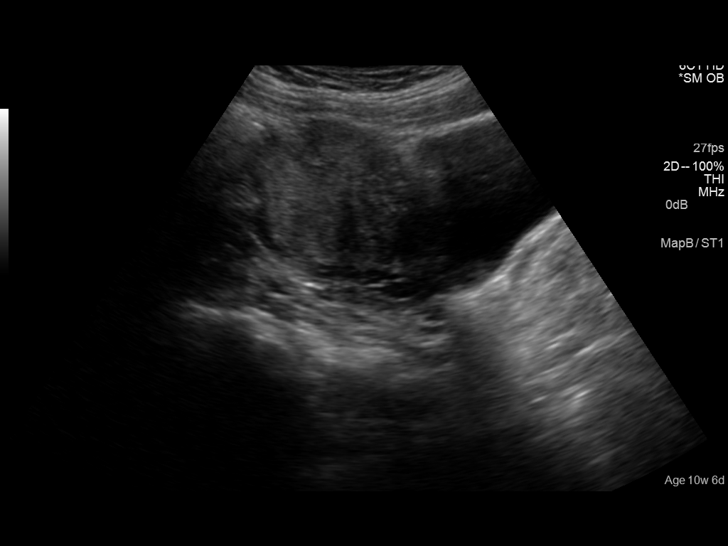
[im 14/53]
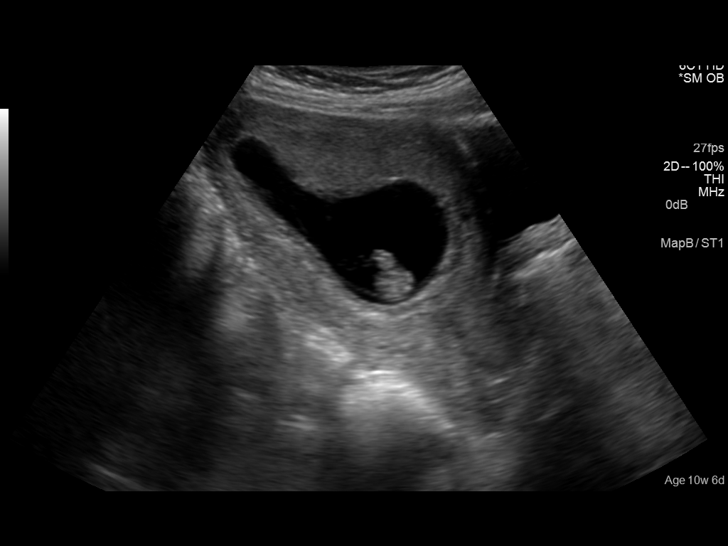
[im 18/53]
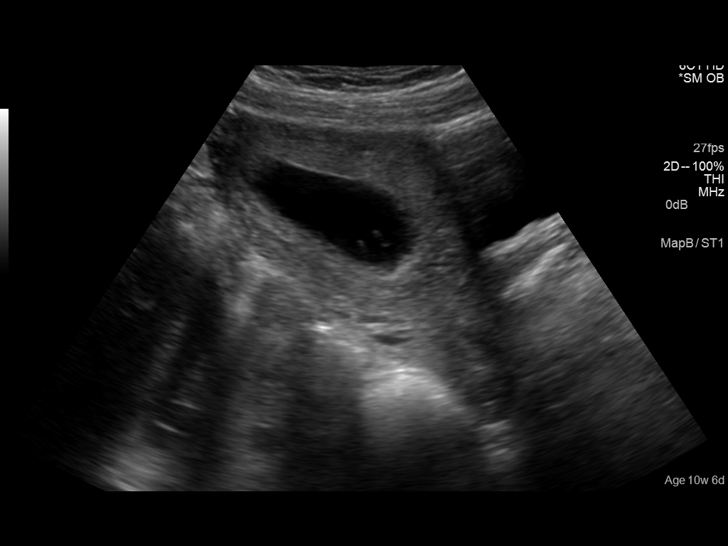
[im 22/53]
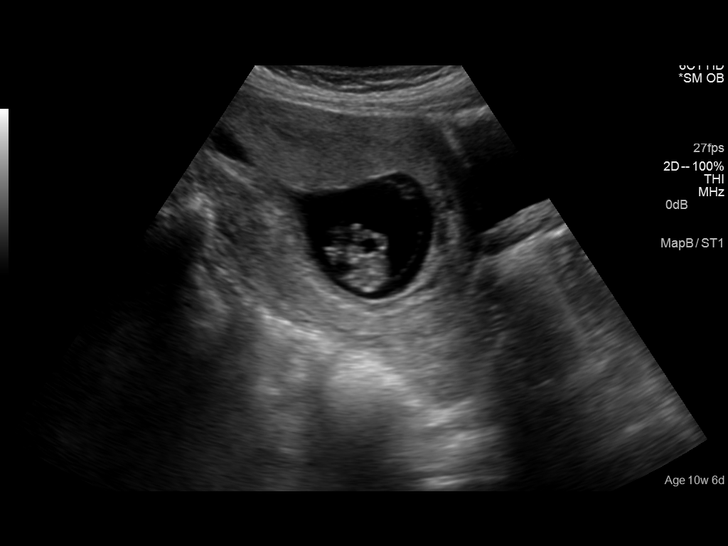
[im 26/53]
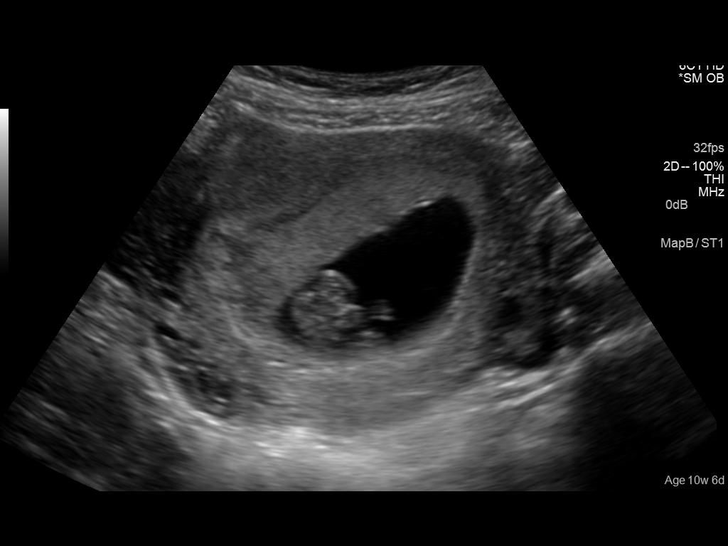
[im 29/53]
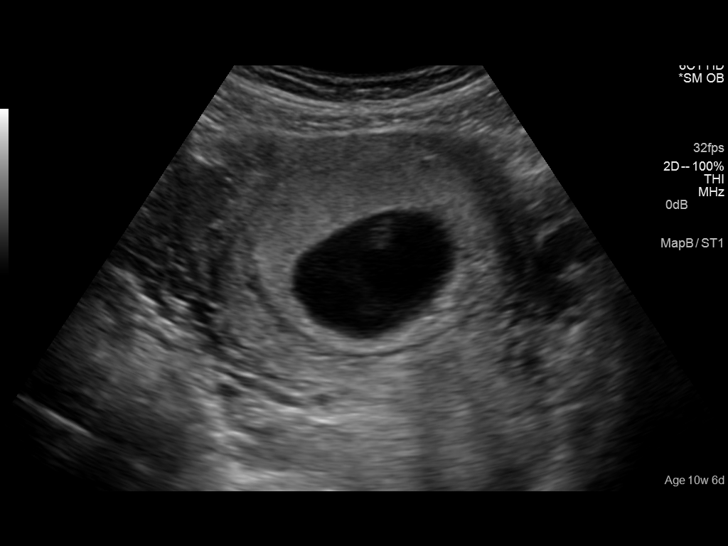
[im 33/53]
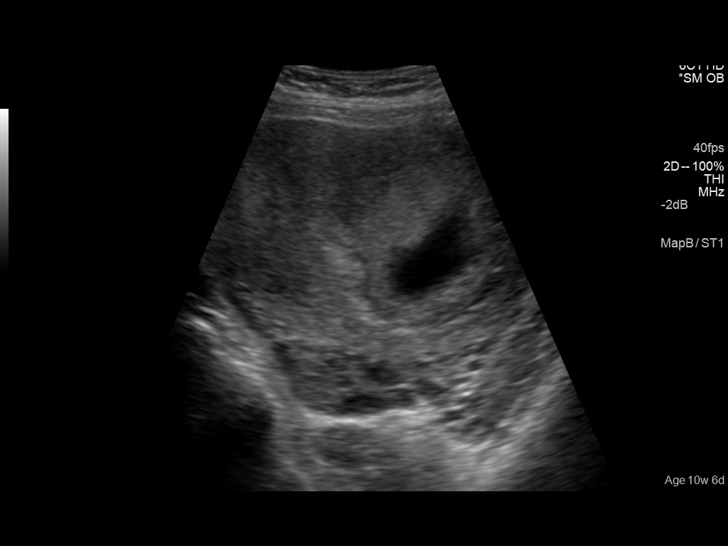
[im 37/53]
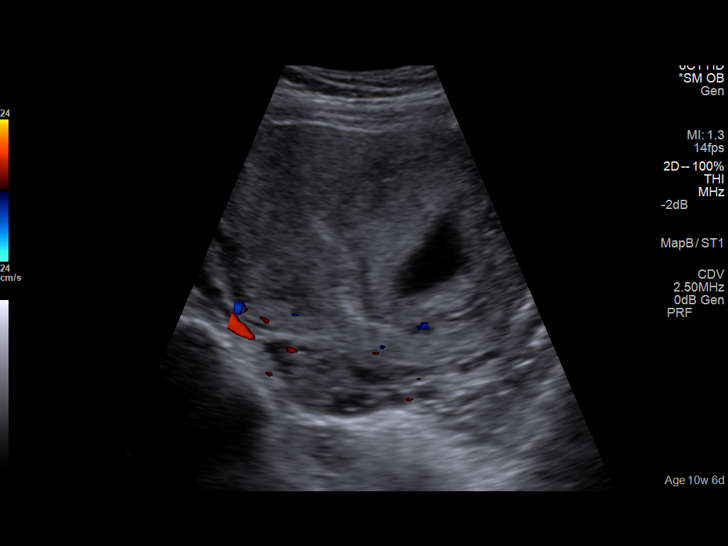
[im 41/53]
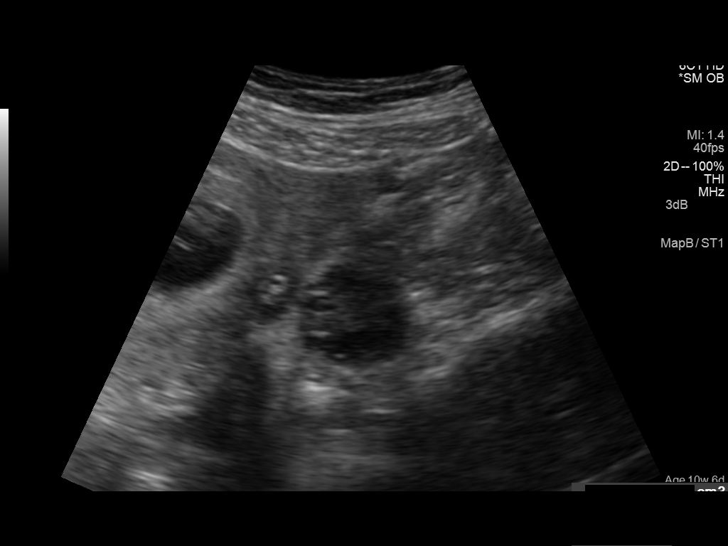
[im 45/53]
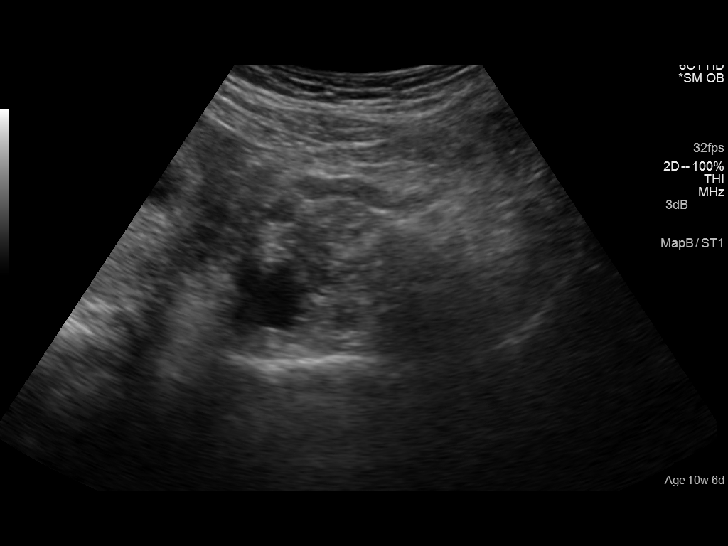
[im 49/53]
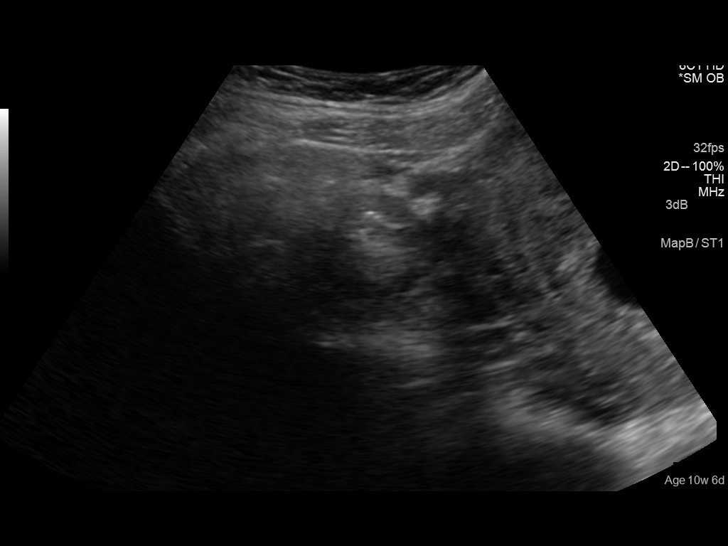
[im 53/53]
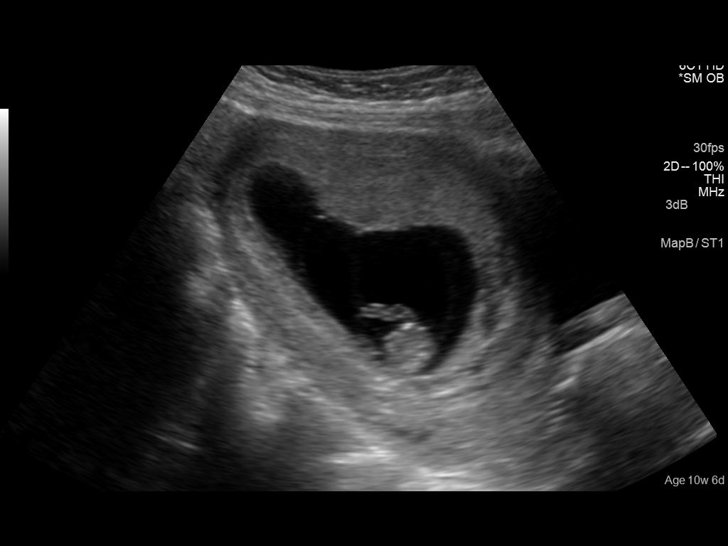

[14 of 28 positions shown; findings below may reference images not displayed]

FINDINGS: Intrauterine gestational sac: Visualized/normal in shape.

Yolk sac:  Visualized

Embryo:  Visualized

Cardiac Activity: Visualized

Heart Rate: 168 bpm

CRL:   38 mm   10 w 5 d                  US EDC: April 20, 2015

Maternal uterus/adnexae: There is no demonstrable subchorionic
hemorrhage. Cervical os is closed. There is no maternal extrauterine
pelvic or adnexal mass. No free pelvic fluid.
IMPRESSION: Single live intrauterine gestation with estimated gestational age of
11- weeks.

## 2015-02-25 ENCOUNTER — Ambulatory Visit
Admit: 2015-02-25 | Disposition: A | Discharge status: Home / Self Care | Payer: Self-pay | Admitting: Obstetrics and Gynecology

## 2015-02-26 ENCOUNTER — Ambulatory Visit
Admit: 2015-02-26 | Disposition: A | Discharge status: Home / Self Care | Payer: Self-pay | Attending: Obstetrics and Gynecology | Admitting: Obstetrics and Gynecology
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
## 2015-03-30 LAB — OB RESULTS CONSOLE GC/CHLAMYDIA
Chlamydia: NEGATIVE
GC PROBE AMP, GENITAL: NEGATIVE

## 2015-04-02 LAB — OB RESULTS CONSOLE GBS: GBS: NEGATIVE

## 2015-04-04 ENCOUNTER — Inpatient Hospital Stay
Admission: EM | Admit: 2015-04-04 | Discharge: 2015-04-04 | Disposition: A | Discharge status: Home / Self Care | Payer: Medicaid Other | Attending: Obstetrics and Gynecology | Admitting: Obstetrics and Gynecology

## 2015-04-04 ENCOUNTER — Encounter: Payer: Self-pay | Admitting: Advanced Practice Midwife

## 2015-04-04 DIAGNOSIS — R55 Syncope and collapse: Secondary | ICD-10-CM | POA: Insufficient documentation

## 2015-04-04 DIAGNOSIS — Z3A39 39 weeks gestation of pregnancy: Secondary | ICD-10-CM | POA: Diagnosis not present

## 2015-04-04 DIAGNOSIS — O26893 Other specified pregnancy related conditions, third trimester: Secondary | ICD-10-CM | POA: Insufficient documentation

## 2015-04-04 NOTE — OB Triage Note (Signed)
Per Leafy Ro MD order pt discharged ambulatory to home.

## 2015-04-04 NOTE — OB Triage Note (Signed)
Pt arrived via wheelchair from ED

## 2015-04-07 NOTE — H&P (Signed)
L&D Evaluation:  History:   HPI 29 year old G3P1 presents to L&D with c/o SROM and ctx's. EDD 12/23/12 which makes her 37 weeks 2 days today. PNC at [facility] notable for early entry to care, and LGSIL pap. Labs: A Pos, RI, VI, GBS Neg    Presents with contractions, leaking fluid    Patient's Medical History hx convulsions- no meds    Patient's Surgical History none    Medications Pre Natal Vitamins    Allergies NKDA    Family History Non-Contributory    Exam:   Vital Signs stable     Urine Protein not completed    General no apparent distress, breathing through ctx's    Mental Status clear     Chest nl effort     Abdomen gravid, tender with contractions    Estimated Fetal Weight Average for gestational age    Edema no edema     Pelvic 6 on arrival per RN exam    Mebranes Ruptured    Description clear    FHT normal rate with no decels    Ucx regular   Impression:   Impression active labor   Plan:   Plan EFM/NST, monitor contractions and for cervical change, anticipate SVD   Electronic Signatures: Shann Medal (CNM)  (Signed 07-Jan-14 03:47)  Authored: L&D Evaluation   Last Updated: 07-Jan-14 03:47 by Shann Medal (CNM)

## 2015-04-07 NOTE — H&P (Signed)
L&D Evaluation:  History:  HPI 29yo [id number] with LMP of 05/13/14 Edd EDD pf 04/23/15 and [place] at 7 weeks with EDD of 04/23/15 with PNC at [facility] OB significant for syncope, multiple visits with ER visits, Cardiology wu neg for any pathology, HSV Type I hx, HA disorder, and GBS bacturia. Neurology eval pt an dno etiology except syncope with pregnancy identified. Pt came here today for "Vag spotting, birght red on underwear this am approx 1 stip of blood noted on underwear. No further VB or UC's noted. No ROM or decreased FM. No current outbreak of HSV. Cx 0.5 cm/no effacement/no sign of labor.   Presents with vaginal bleeding   Patient's Medical History Syncope, GBS bacturia, Seizures age 64, lst one was 5 yrs ago.   Patient's Surgical History none   Medications Pre [place] Vitamins   Allergies NKDA   Social History none   Family History SUO:RVIFBPP ancer, PGM: [place] cancer, Pat great GM: Breast Ca   ROS:  ROS All systems were reviewed.  HEENT, CNS, GI, GU, Respiratory, CV, Renal and Musculoskeletal systems were found to be normal.   Exam:  Vital Signs stable  Afebrile, BP 94/58   Urine Protein not completed   General no apparent distress, other   Mental Status clear   Chest clear   Heart normal sinus rhythm, no murmur/gallop/rubs   Abdomen gravid, non-tender   Estimated Fetal Weight Average for gestational age   Fetal Position vtx   Back no CVAT   Edema no edema  1+   Reflexes 1+   Clonus negative   Pelvic 0.5 cm/0%/vtx high   Mebranes Intact   FHT normal rate with no decels, NST reactive with 2 accels 15 x 15 BPM   Ucx absent   Skin dry   Lymph no lymphadenopathy   Impression:  Impression IUP at 30 weeks with vag spotting   Plan:  Plan discharge   Comments After monitoring, no labor or ROM noted. No VB seen. Scant amt on glove after exam. No further spotting. US done to evaluate placenta. Plac is anterior and no abruption noted on [place]. +FM, +3VC, BPD 31  weeks sized. Amniotic fluid plentiful around the baby. No funneling in front of the cx.   Follow Up Appointment Wed at [place]: [name] (CNM)  (Signed 13-Mar-16 15:55)  Authored: L&D Evaluation   Last Updated: 13-Mar-16 15:55 by [name] (CNM)
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
## 2015-04-09 ENCOUNTER — Inpatient Hospital Stay
Admission: RE | Admit: 2015-04-09 | Discharge: 2015-04-11 | DRG: 774 | Disposition: A | Discharge status: Home / Self Care | Payer: Medicaid Other | Source: Ambulatory Visit | Attending: Obstetrics and Gynecology | Admitting: Obstetrics and Gynecology

## 2015-04-09 DIAGNOSIS — F329 Major depressive disorder, single episode, unspecified: Secondary | ICD-10-CM | POA: Diagnosis present

## 2015-04-09 DIAGNOSIS — O99834 Other infection carrier state complicating childbirth: Secondary | ICD-10-CM | POA: Diagnosis present

## 2015-04-09 DIAGNOSIS — Z3A38 38 weeks gestation of pregnancy: Secondary | ICD-10-CM | POA: Diagnosis present

## 2015-04-09 DIAGNOSIS — O99344 Other mental disorders complicating childbirth: Secondary | ICD-10-CM | POA: Diagnosis present

## 2015-04-09 HISTORY — DX: Anxiety disorder, unspecified: F41.9

## 2015-04-09 HISTORY — DX: Unspecified convulsions: R56.9

## 2015-04-09 HISTORY — DX: Major depressive disorder, single episode, unspecified: F32.9

## 2015-04-09 HISTORY — DX: Depression, unspecified: F32.A

## 2015-04-09 MED ORDER — LIDOCAINE HCL (PF) 1 % IJ SOLN
INTRAMUSCULAR | Status: AC
  Administered 2015-04-09: 30 mL via SUBCUTANEOUS
  Filled 2015-04-09: qty 30

## 2015-04-09 MED ORDER — FERROUS SULFATE 325 (65 FE) MG PO TABS
325.0000 mg | ORAL_TABLET | Freq: Two times a day (BID) | ORAL | Status: DC
  Administered 2015-04-10 – 2015-04-11 (×3): 325 mg via ORAL
  Filled 2015-04-09 (×3): qty 1

## 2015-04-09 MED ORDER — FLEET ENEMA 7-19 GM/118ML RE ENEM: 1.0000 | ENEMA | Freq: Every day | RECTAL | Status: DC | PRN

## 2015-04-09 MED ORDER — PRENATAL MULTIVITAMIN CH
1.0000 | ORAL_TABLET | Freq: Every day | ORAL | Status: DC
  Filled 2015-04-09: qty 1

## 2015-04-09 MED ORDER — ONDANSETRON HCL 4 MG PO TABS
4.0000 mg | ORAL_TABLET | ORAL | Status: DC | PRN
  Filled 2015-04-09: qty 1

## 2015-04-09 MED ORDER — OXYTOCIN 40 UNITS IN LACTATED RINGERS INFUSION - SIMPLE MED: 62.5000 mL/h | INTRAVENOUS | Status: DC | PRN

## 2015-04-09 MED ORDER — DIPHENHYDRAMINE HCL 25 MG PO CAPS: 25.0000 mg | ORAL_CAPSULE | Freq: Four times a day (QID) | ORAL | Status: DC | PRN

## 2015-04-09 MED ORDER — OXYCODONE-ACETAMINOPHEN 5-325 MG PO TABS
1.0000 | ORAL_TABLET | ORAL | Status: DC | PRN
  Administered 2015-04-10: 1 via ORAL
  Filled 2015-04-09: qty 1

## 2015-04-09 MED ORDER — SENNOSIDES-DOCUSATE SODIUM 8.6-50 MG PO TABS
2.0000 | ORAL_TABLET | ORAL | Status: DC
  Filled 2015-04-09 (×2): qty 2

## 2015-04-09 MED ORDER — LANOLIN HYDROUS EX OINT: TOPICAL_OINTMENT | CUTANEOUS | Status: DC | PRN

## 2015-04-09 MED ORDER — AMMONIA AROMATIC IN INHA
RESPIRATORY_TRACT | Status: AC
  Filled 2015-04-09: qty 10

## 2015-04-09 MED ORDER — PRENATAL MULTIVITAMIN CH
1.0000 | ORAL_TABLET | Freq: Every day | ORAL | Status: DC
  Administered 2015-04-10 – 2015-04-11 (×2): 1 via ORAL
  Filled 2015-04-09 (×3): qty 1

## 2015-04-09 MED ORDER — WITCH HAZEL-GLYCERIN EX PADS: 1.0000 "application " | MEDICATED_PAD | CUTANEOUS | Status: DC | PRN

## 2015-04-09 MED ORDER — ONDANSETRON HCL 4 MG/2ML IJ SOLN: 4.0000 mg | Freq: Four times a day (QID) | INTRAMUSCULAR | Status: DC | PRN

## 2015-04-09 MED ORDER — SODIUM CHLORIDE 0.9 % IJ SOLN: 3.0000 mL | INTRAMUSCULAR | Status: DC | PRN

## 2015-04-09 MED ORDER — LACTATED RINGERS IV SOLN
INTRAVENOUS | Status: DC
  Administered 2015-04-09: 18:00:00 via INTRAVENOUS

## 2015-04-09 MED ORDER — OXYTOCIN 40 UNITS IN LACTATED RINGERS INFUSION - SIMPLE MED
INTRAVENOUS | Status: AC
  Filled 2015-04-09: qty 1000

## 2015-04-09 MED ORDER — SODIUM CHLORIDE 0.9 % IV SOLN: 250.0000 mL | INTRAVENOUS | Status: DC | PRN

## 2015-04-09 MED ORDER — BUTORPHANOL TARTRATE 1 MG/ML IJ SOLN
INTRAMUSCULAR | Status: AC
  Administered 2015-04-09: 2 mg via INTRAVENOUS
  Filled 2015-04-09: qty 2

## 2015-04-09 MED ORDER — ONDANSETRON HCL 4 MG/2ML IJ SOLN: 4.0000 mg | INTRAMUSCULAR | Status: DC | PRN

## 2015-04-09 MED ORDER — CITRIC ACID-SODIUM CITRATE 334-500 MG/5ML PO SOLN: 30.0000 mL | ORAL | Status: DC | PRN

## 2015-04-09 MED ORDER — DIBUCAINE 1 % RE OINT: 1.0000 "application " | TOPICAL_OINTMENT | RECTAL | Status: DC | PRN

## 2015-04-09 MED ORDER — ACETAMINOPHEN 325 MG PO TABS: 650.0000 mg | ORAL_TABLET | ORAL | Status: DC | PRN

## 2015-04-09 MED ORDER — LIDOCAINE HCL (PF) 1 % IJ SOLN
30.0000 mL | INTRAMUSCULAR | Status: DC | PRN
  Administered 2015-04-09: 30 mL via SUBCUTANEOUS
  Filled 2015-04-09: qty 30

## 2015-04-09 MED ORDER — OXYTOCIN 10 UNIT/ML IJ SOLN
INTRAMUSCULAR | Status: AC
  Filled 2015-04-09: qty 2

## 2015-04-09 MED ORDER — BUTORPHANOL TARTRATE 1 MG/ML IJ SOLN
1.0000 mg | INTRAMUSCULAR | Status: DC | PRN
  Administered 2015-04-09: 2 mg via INTRAVENOUS

## 2015-04-09 MED ORDER — OXYTOCIN BOLUS FROM INFUSION
500.0000 mL | INTRAVENOUS | Status: DC
  Administered 2015-04-09: 500 mL via INTRAVENOUS

## 2015-04-09 MED ORDER — SODIUM CHLORIDE 0.9 % IJ SOLN: 3.0000 mL | Freq: Two times a day (BID) | INTRAMUSCULAR | Status: DC

## 2015-04-09 MED ORDER — MISOPROSTOL 200 MCG PO TABS
ORAL_TABLET | ORAL | Status: AC
  Filled 2015-04-09: qty 4

## 2015-04-09 MED ORDER — IBUPROFEN 600 MG PO TABS
600.0000 mg | ORAL_TABLET | Freq: Four times a day (QID) | ORAL | Status: DC
  Administered 2015-04-09 – 2015-04-11 (×6): 600 mg via ORAL
  Filled 2015-04-09 (×6): qty 1

## 2015-04-09 MED ORDER — OXYCODONE-ACETAMINOPHEN 5-325 MG PO TABS: 2.0000 | ORAL_TABLET | ORAL | Status: DC | PRN

## 2015-04-09 MED ORDER — SIMETHICONE 80 MG PO CHEW
80.0000 mg | CHEWABLE_TABLET | ORAL | Status: DC | PRN
  Filled 2015-04-09: qty 1

## 2015-04-09 MED ORDER — SODIUM CHLORIDE 0.9 % IJ SOLN
INTRAMUSCULAR | Status: AC
  Filled 2015-04-09: qty 6

## 2015-04-09 MED ORDER — OXYCODONE-ACETAMINOPHEN 5-325 MG PO TABS: 1.0000 | ORAL_TABLET | ORAL | Status: DC | PRN

## 2015-04-09 MED ORDER — LACTATED RINGERS IV SOLN: 500.0000 mL | INTRAVENOUS | Status: DC | PRN

## 2015-04-09 MED ORDER — BENZOCAINE-MENTHOL 20-0.5 % EX AERO
1.0000 "application " | INHALATION_SPRAY | CUTANEOUS | Status: DC | PRN
  Administered 2015-04-09: 1 via TOPICAL
  Filled 2015-04-09: qty 56

## 2015-04-09 MED ORDER — BISACODYL 10 MG RE SUPP: 10.0000 mg | Freq: Every day | RECTAL | Status: DC | PRN
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
## 2015-04-09 NOTE — H&P (Signed)
Latoya Luna is a 29 y.o. female presenting for labor S/S. PNC at [facility] significant for syncopal episodes during pregnancy. History OB History    Gravida Para Term Preterm AB TAB SAB Ectopic Multiple Living   3 2 2  0 0 0 0 0 0 0     Past Medical History  Diagnosis Date  . Seizures     last seizure 5 years ago  . Anxiety   . Depression    Past Surgical History  Procedure Laterality Date  . No past surgeries     Family History: family history is not on file. Social History:  reports that she has never smoked. She does not have any smokeless tobacco history on file. She reports that she does not drink alcohol. Her drug history is not on file.   Prenatal Transfer Tool  Maternal Diabetes: No Genetic Screening: Normal Maternal Ultrasounds/Referrals: Normal Fetal Ultrasounds or other Referrals:  None Maternal Substance Abuse:  No Significant Maternal Medications:  None Significant Maternal Lab Results:  GBS neg Other Comments:  None  ROS  Dilation: 8 Effacement (%): 80 Station: -1 Exam by:: CRonnald Ramp, CNM Temperature 98.5 F (36.9 C), temperature source Oral, resp. rate 14, height 5\' 7"  (1.702 m), weight 82.555 kg (182 lb), last menstrual period 07/17/2014. Exam Physical Exam  Prenatal labs: ABO, Rh: A/Positive/-- (10/30 0000) Antibody: Negative (10/30 0000) Rubella: Immune (10/30 0000) RPR: Nonreactive (10/30 0000)  HBsAg: Negative (10/30 0000)  HIV: Non-reactive (10/30 0000)  GBS: Positive (12/09 0000)   Assessment/Plan: 1. Term pregnancy in labor P: Antic SVD  Latoya [place] 04/09/2015, 7:19 PM

## 2015-04-10 LAB — CBC
HEMATOCRIT: 31.1 % — AB (ref 35.0–47.0)
Hemoglobin: 10.8 g/dL — ABNORMAL LOW (ref 12.0–16.0)
MCH: 31.1 pg (ref 26.0–34.0)
MCHC: 34.6 g/dL (ref 32.0–36.0)
MCV: 89.9 fL (ref 80.0–100.0)
Platelets: 181 10*3/uL (ref 150–440)
RBC: 3.46 MIL/uL — ABNORMAL LOW (ref 3.80–5.20)
RDW: 14.9 % — ABNORMAL HIGH (ref 11.5–14.5)
WBC: 8.4 10*3/uL (ref 3.6–11.0)

## 2015-04-10 MED ORDER — DOCUSATE SODIUM 100 MG PO CAPS
100.0000 mg | ORAL_CAPSULE | Freq: Two times a day (BID) | ORAL | Status: DC
  Administered 2015-04-10 – 2015-04-11 (×3): 100 mg via ORAL
  Filled 2015-04-10 (×3): qty 1

## 2015-04-10 NOTE — Progress Notes (Signed)
Post Partum Day 1 Subjective: no complaints, up ad lib, voiding, tolerating PO and breastfeeding  Objective: Blood pressure 101/66, pulse 73, temperature 97.6 F (36.4 C), temperature source Oral, resp. rate 18, height 5\' 7"  (1.702 m), weight 82.555 kg (182 lb), last menstrual period 07/17/2014, SpO2 100 %.  Physical Exam:  General: alert, cooperative and no distress Lochia: appropriate Uterine Fundus: firm, non-tender Incision: n/a DVT Evaluation: No evidence of DVT seen on physical exam.   Recent Labs  04/10/15 0634  HGB 10.8*  HCT 31.1*    Assessment/Plan: Plan for discharge tomorrow, Breastfeeding, Lactation consult and Contraception Nexplanon   LOS: 1 day   [place], [place] 04/10/2015, 11:04 AM

## 2015-04-11 MED ORDER — IBUPROFEN 600 MG PO TABS: 600.0000 mg | ORAL_TABLET | Freq: Four times a day (QID) | ORAL | Status: DC

## 2015-04-11 MED ORDER — SERTRALINE HCL 25 MG PO TABS: 25.0000 mg | ORAL_TABLET | Freq: Every day | ORAL | Status: DC

## 2015-04-11 MED ORDER — OXYCODONE-ACETAMINOPHEN 5-325 MG PO TABS: 1.0000 | ORAL_TABLET | ORAL | Status: DC | PRN

## 2015-04-11 NOTE — Progress Notes (Signed)
Post Partum Day 2 Subjective: no complaints, up ad lib, voiding, tolerating PO and breastfeeding  Objective: Blood pressure 100/59, pulse 74, temperature 98.7 F (37.1 C), temperature source Oral, resp. rate 20, height 5\' 7"  (1.702 m), weight 82.555 kg (182 lb), last menstrual period 07/17/2014, SpO2 100 %.  Physical Exam:  General: alert, cooperative and no distress Lochia: appropriate Uterine Fundus: firm, non-tender Incision: n/a DVT Evaluation: No evidence of DVT seen on physical exam.   Recent Labs  04/10/15 0634  HGB 10.8*  HCT 31.1*    Assessment/Plan: Plan for discharge today, Breastfeeding, Lactation consult and Contraception Nexplanon. History of depression, will restart sertraline.    LOS: 2 days   [place], [place] 04/11/2015, 7:56 AM

## 2015-04-11 NOTE — Progress Notes (Signed)
Discharge order in by Dr. Archie Balboa. Instructions reviewed with patient. Patient v/u of instructions. Prescription given to pt. ID bands of mom and infant matched. Escorted by nursing via w/c in stable condition, infant in arms.

## 2015-04-11 NOTE — Discharge Instructions (Addendum)
No intercourse for 6 weeks, no tampons, douching or enemas. No heavy lifting, nothing heavier than the baby. No driving for 2 weeks. Call your doctor for fever >100.4, increase in pain, heavy bleeding (changing your pad every hour or passing clots the size of your palm); for chest pain, blurred vision, seeing spots, bad headache that won't go away come to the ER.    Follow up in 6 weeks with your provider.  Vaginal Delivery, Care After Refer to this sheet in the next few weeks. These discharge instructions provide you with information on caring for yourself after delivery. Your caregiver may also give you specific instructions. Your treatment has been planned according to the most current medical practices available, but problems sometimes occur. Call your caregiver if you have any problems or questions after you go home. HOME CARE INSTRUCTIONS  Take over-the-counter or prescription medicines only as directed by your caregiver or pharmacist.  Do not drink alcohol, especially if you are breastfeeding or taking medicine to relieve pain.  Do not chew or smoke tobacco.  Do not use illegal drugs.  Continue to use good perineal care. Good perineal care includes:  Wiping your perineum from front to back.  Keeping your perineum clean.  Do not use tampons or douche until your caregiver says it is okay.  Shower, wash your hair, and take tub baths as directed by your caregiver.  Wear a well-fitting bra that provides breast support.  Eat healthy foods.  Drink enough fluids to keep your urine clear or pale yellow.  Eat high-fiber foods such as whole grain cereals and breads, brown rice, beans, and fresh fruits and vegetables every day. These foods may help prevent or relieve constipation.  Follow your caregiver's recommendations regarding resumption of activities such as climbing stairs, driving, lifting, exercising, or traveling.  Talk to your caregiver about resuming sexual activities.  Resumption of sexual activities is dependent upon your risk of infection, your rate of healing, and your comfort and desire to resume sexual activity.  Try to have someone help you with your household activities and your newborn for at least a few days after you leave the hospital.  Rest as much as possible. Try to rest or take a nap when your newborn is sleeping.  Increase your activities gradually.  Keep all of your scheduled postpartum appointments. It is very important to keep your scheduled follow-up appointments. At these appointments, your caregiver will be checking to make sure that you are healing physically and emotionally. SEEK MEDICAL CARE IF:   You are passing large clots from your vagina. Save any clots to show your caregiver.  You have a foul smelling discharge from your vagina.  You have trouble urinating.  You are urinating frequently.  You have pain when you urinate.  You have a change in your bowel movements.  You have increasing redness, pain, or swelling near your vaginal incision (episiotomy) or vaginal tear.  You have pus draining from your episiotomy or vaginal tear.  Your episiotomy or vaginal tear is separating.  You have painful, hard, or reddened breasts.  You have a severe headache.  You have blurred vision or see spots.  You feel sad or depressed.  You have thoughts of hurting yourself or your newborn.  You have questions about your care, the care of your newborn, or medicines.  You are dizzy or light-headed.  You have a rash.  You have nausea or vomiting.  You were breastfeeding and have not had a menstrual  period within 12 weeks after you stopped breastfeeding.  You are not breastfeeding and have not had a menstrual period by the 12th week after delivery.  You have a fever. SEEK IMMEDIATE MEDICAL CARE IF:   You have persistent pain.  You have chest pain.  You have shortness of breath.  You faint.  You have leg pain.  You  have stomach pain.  Your vaginal bleeding saturates two or more sanitary pads in 1 hour. MAKE SURE YOU:   Understand these instructions.  Will watch your condition.  Will get help right away if you are not doing well or get worse. Document Released: 11/11/2000 Document Revised: 03/31/2014 Document Reviewed: 07/11/2012 [facility] Patient Information 2015 [place], [place]. This information is not intended to replace advice given to you by your health care provider. Make sure you discuss any questions you have with your health care provider.

## 2015-04-11 NOTE — Discharge Summary (Signed)
Obstetric Discharge Summary Reason for Admission: onset of labor Prenatal Procedures: none Intrapartum Procedures: spontaneous vaginal delivery Postpartum Procedures: none Complications-Operative and Postpartum: none HEMOGLOBIN  Date Value Ref Range Status  04/10/2015 10.8* 12.0 - 16.0 g/dL Final   HGB  Date Value Ref Range Status  01/01/2015 11.4* 12.0-16.0 g/dL Final   HCT  Date Value Ref Range Status  04/10/2015 31.1* 35.0 - 47.0 % Final  01/01/2015 34.0* 35.0-47.0 % Final    Physical Exam:  General: alert, cooperative and no distress Lochia: appropriate Uterine Fundus: firm, non-tender Incision: n/a DVT Evaluation: No evidence of DVT seen on physical exam.  Discharge Diagnoses: Term Pregnancy-delivered  Discharge Information: Date: 04/11/2015 Activity: unrestricted and pelvic rest Diet: routine Medications: Percocet, Motrin, Zoloft Condition: stable Instructions: refer to practice specific booklet Discharge to: home   Newborn Data: Live born female  Birth Weight: 7 lb 8.5 oz (3416 g) APGAR: 7, 9  Home with mother.  Latoya Luna, Latoya Luna 04/11/2015, 8:01 AM

## 2015-04-13 LAB — SURGICAL PATHOLOGY

## 2015-06-03 ENCOUNTER — Emergency Department
Admission: EM | Admit: 2015-06-03 | Discharge: 2015-06-03 | Discharge status: Against Medical Advice | Payer: Medicaid Other | Attending: Emergency Medicine | Admitting: Emergency Medicine

## 2015-06-03 ENCOUNTER — Other Ambulatory Visit: Payer: Self-pay

## 2015-06-03 DIAGNOSIS — R55 Syncope and collapse: Secondary | ICD-10-CM | POA: Insufficient documentation

## 2015-06-03 DIAGNOSIS — W228XXA Striking against or struck by other objects, initial encounter: Secondary | ICD-10-CM | POA: Diagnosis not present

## 2015-06-03 DIAGNOSIS — Y9289 Other specified places as the place of occurrence of the external cause: Secondary | ICD-10-CM | POA: Insufficient documentation

## 2015-06-03 DIAGNOSIS — Y998 Other external cause status: Secondary | ICD-10-CM | POA: Diagnosis not present

## 2015-06-03 DIAGNOSIS — Y9389 Activity, other specified: Secondary | ICD-10-CM | POA: Insufficient documentation

## 2015-06-03 DIAGNOSIS — R112 Nausea with vomiting, unspecified: Secondary | ICD-10-CM | POA: Insufficient documentation

## 2015-06-03 DIAGNOSIS — S0990XA Unspecified injury of head, initial encounter: Secondary | ICD-10-CM | POA: Diagnosis not present

## 2015-06-03 DIAGNOSIS — R569 Unspecified convulsions: Secondary | ICD-10-CM | POA: Insufficient documentation

## 2015-06-03 LAB — BASIC METABOLIC PANEL
ANION GAP: 7 (ref 5–15)
BUN: 14 mg/dL (ref 6–20)
CALCIUM: 8.5 mg/dL — AB (ref 8.9–10.3)
CO2: 25 mmol/L (ref 22–32)
Chloride: 105 mmol/L (ref 101–111)
Creatinine, Ser: 0.46 mg/dL (ref 0.44–1.00)
GFR calc Af Amer: >60 mL/min (ref >60)
GFR calc non Af Amer: >60 mL/min (ref >60)
Glucose, Bld: 85 mg/dL (ref 65–99)
Potassium: 3.9 mmol/L (ref 3.5–5.1)
SODIUM: 137 mmol/L (ref 135–145)

## 2015-06-03 LAB — CBC
HEMATOCRIT: 40.1 % (ref 35.0–47.0)
Hemoglobin: 13.3 g/dL (ref 12.0–16.0)
MCH: 29.9 pg (ref 26.0–34.0)
MCHC: 33.1 g/dL (ref 32.0–36.0)
MCV: 90.4 fL (ref 80.0–100.0)
PLATELETS: 195 10*3/uL (ref 150–440)
RBC: 4.44 MIL/uL (ref 3.80–5.20)
RDW: 14 % (ref 11.5–14.5)
WBC: 4.8 10*3/uL (ref 3.6–11.0)

## 2015-06-03 NOTE — ED Notes (Signed)
Pt comes into the ED via EMS from pt daughter pediatrician..states she took her daughter to the doctor for N/v and while there the pt had a syncople episode from standing..states she has a hx of syncope and seizure..states she did not eat much this morning and is nursing the baby..states she has a slight HA from hitting her head on the floor

## 2015-06-29 ENCOUNTER — Other Ambulatory Visit: Payer: Self-pay

## 2015-06-29 ENCOUNTER — Emergency Department
Admission: EM | Admit: 2015-06-29 | Discharge: 2015-06-29 | Disposition: A | Discharge status: Home / Self Care | Payer: Medicaid Other | Attending: Emergency Medicine | Admitting: Emergency Medicine

## 2015-06-29 DIAGNOSIS — R42 Dizziness and giddiness: Secondary | ICD-10-CM | POA: Insufficient documentation

## 2015-06-29 DIAGNOSIS — S0083XA Contusion of other part of head, initial encounter: Secondary | ICD-10-CM | POA: Diagnosis not present

## 2015-06-29 DIAGNOSIS — R55 Syncope and collapse: Secondary | ICD-10-CM | POA: Insufficient documentation

## 2015-06-29 DIAGNOSIS — Y998 Other external cause status: Secondary | ICD-10-CM | POA: Diagnosis not present

## 2015-06-29 DIAGNOSIS — Y9389 Activity, other specified: Secondary | ICD-10-CM | POA: Insufficient documentation

## 2015-06-29 DIAGNOSIS — Y9289 Other specified places as the place of occurrence of the external cause: Secondary | ICD-10-CM | POA: Diagnosis not present

## 2015-06-29 DIAGNOSIS — W01198A Fall on same level from slipping, tripping and stumbling with subsequent striking against other object, initial encounter: Secondary | ICD-10-CM | POA: Diagnosis not present

## 2015-06-29 LAB — CBC WITH DIFFERENTIAL/PLATELET
Basophils Absolute: 0 10*3/uL (ref 0–0.1)
Basophils Relative: 1 %
EOS ABS: 0.1 10*3/uL (ref 0–0.7)
Eosinophils Relative: 2 %
HCT: 36.5 % (ref 35.0–47.0)
Hemoglobin: 12.5 g/dL (ref 12.0–16.0)
LYMPHS PCT: 38 %
Lymphs Abs: 1.6 10*3/uL (ref 1.0–3.6)
MCH: 30.6 pg (ref 26.0–34.0)
MCHC: 34.1 g/dL (ref 32.0–36.0)
MCV: 89.8 fL (ref 80.0–100.0)
MONOS PCT: 8 %
Monocytes Absolute: 0.4 10*3/uL (ref 0.2–0.9)
NEUTROS ABS: 2.2 10*3/uL (ref 1.4–6.5)
Neutrophils Relative %: 51 %
PLATELETS: 216 10*3/uL (ref 150–440)
RBC: 4.07 MIL/uL (ref 3.80–5.20)
RDW: 14.2 % (ref 11.5–14.5)
WBC: 4.3 10*3/uL (ref 3.6–11.0)

## 2015-06-29 LAB — BASIC METABOLIC PANEL
ANION GAP: 7 (ref 5–15)
BUN: 19 mg/dL (ref 6–20)
CALCIUM: 9.1 mg/dL (ref 8.9–10.3)
CO2: 27 mmol/L (ref 22–32)
Chloride: 105 mmol/L (ref 101–111)
Creatinine, Ser: 0.46 mg/dL (ref 0.44–1.00)
GFR calc Af Amer: >60 mL/min (ref >60)
Glucose, Bld: 90 mg/dL (ref 65–99)
POTASSIUM: 3.6 mmol/L (ref 3.5–5.1)
Sodium: 139 mmol/L (ref 135–145)

## 2015-06-29 LAB — URINALYSIS COMPLETE WITH MICROSCOPIC (ARMC ONLY)
BACTERIA UA: NONE SEEN
Bilirubin Urine: NEGATIVE
Glucose, UA: NEGATIVE mg/dL
HGB URINE DIPSTICK: NEGATIVE
KETONES UR: NEGATIVE mg/dL
NITRITE: NEGATIVE
PH: 8 (ref 5.0–8.0)
Protein, ur: NEGATIVE mg/dL
SPECIFIC GRAVITY, URINE: 1.018 (ref 1.005–1.030)

## 2015-06-29 NOTE — ED Notes (Addendum)
Pt to ED via EMS after syncopal episode with loss of loc and hit head. Pt 11 weeks post partum, having recent vaginal bleeding. Pt states HA at this time along with dizziness, pain 5/10. Denies chest pain, SOB, n/v/d. Pt AAOx3 on arrival, vitals wnl.

## 2015-06-29 NOTE — Discharge Instructions (Signed)
Your exam and evaluation are reassuring. Make sure you drink plenty fluids, especially breast-feeding.. You feel lightheaded or dizzy make sure you sit down.  Return to the emergency department for any worsening condition including chest pain, trouble breathing, fever, abdominal pain, or any new or worsening vaginal bleeding or cramping.   Syncope Syncope means a person passes out (faints). The person usually wakes up in less than 5 minutes. It is important to seek medical care for syncope. HOME CARE  Have someone stay with you until you feel normal.  Do not drive, use machines, or play sports until your doctor says it is okay.  Keep all doctor visits as told.  Lie down when you feel like you might pass out. Take deep breaths. Wait until you feel normal before standing up.  Drink enough fluids to keep your pee (urine) clear or pale yellow.  If you take blood pressure or heart medicine, get up slowly. Take several minutes to sit and then stand. GET HELP RIGHT AWAY IF:   You have a severe headache.  You have pain in the chest, belly (abdomen), or back.  You are bleeding from the mouth or butt (rectum).  You have black or tarry poop (stool).  You have an irregular or very fast heartbeat.  You have pain with breathing.  You keep passing out, or you have shaking (seizures) when you pass out.  You pass out when sitting or lying down.  You feel confused.  You have trouble walking.  You have severe weakness.  You have vision problems. If you fainted, call your local emergency services (911 in [place] Do not drive yourself to the hospital. MAKE SURE YOU:   Understand these instructions.  Will watch your condition.  Will get help right away if you are not doing well or get worse. Document Released: 05/02/2008 Document Revised: 05/15/2012 Document Reviewed: 01/13/2012 [facility] Patient Information 2015 [place], [place]. This information is not intended to replace advice given  to you by your health care provider. Make sure you discuss any questions you have with your health care provider.

## 2015-06-29 NOTE — ED Provider Notes (Signed)
[facility] Emergency Department Provider Note   ____________________________________________  Time seen: 9:55 PM I have reviewed the triage vital signs and the triage nursing note.  HISTORY  Chief Complaint Loss of Consciousness   Historian Patient  HPI Latoya [place] is a 29 y.o. female who is 11 weeks postpartum and exclusively breast-feeding, with a history of seizures, and a history of prior syncope, who felt lightheaded and then passed out for short duration striking the front of her head on the dresser. No altered mental status now. She is not a she had a seizure. No biting of the tongue or urinary incontinence. No recent illnesses. A few days ago she did have 3 days of vaginal bleeding, but this is now stopped. No fevers. No abdominal pain.   Past Medical History  Diagnosis Date  . Seizures     last seizure 5 years ago  . Anxiety   . Depression     Patient Active Problem List   Diagnosis Date Noted  . Labor and delivery, indication for care 04/09/2015  . Indication for care or intervention related to labor and delivery 04/09/2015  . Vaginal delivery 04/09/2015  . 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 Episode of syncope 11/04/2014  . History of brain disorder 09/04/2014    Past Surgical History  Procedure Laterality Date  . No past surgeries      Current Outpatient Rx  Name  Route  Sig  Dispense  Refill  . ibuprofen (ADVIL,MOTRIN) 600 MG tablet   Oral   Take 1 tablet (600 mg total) by mouth every 6 (six) hours.   30 tablet   0   . LORazepam (ATIVAN) 0.5 MG tablet   Oral   Take 0.5 mg by mouth every 8 (eight) hours as needed for anxiety.         Marland Kitchen oxyCODONE-acetaminophen (PERCOCET/ROXICET) 5-325 MG per tablet   Oral   Take 1 tablet by mouth every 4 (four) hours as needed (for pain scale 4-7).   15 tablet   0   . Prenatal Vit-Fe Fumarate-FA (PRENATAL MULTIVITAMIN) TABS tablet   Oral   Take 1 tablet by mouth daily at 12 noon.        . sertraline (ZOLOFT) 25 MG tablet   Oral   Take 1 tablet (25 mg total) by mouth daily.   90 tablet   3     Allergies Review of patient's allergies indicates no known allergies.  No family history on file.  Social History History  Substance Use Topics  . Smoking status: Never Smoker   . Smokeless tobacco: Not on file  . Alcohol Use: No    Review of Systems  Constitutional: Negative for fever. Eyes: Negative for visual changes. ENT: Negative for sore throat. Cardiovascular: Negative for chest pain. Respiratory: Negative for shortness of breath. Gastrointestinal: Negative for abdominal pain, vomiting and diarrhea. Genitourinary: Negative for dysuria. Musculoskeletal: Negative for back pain. Skin: Negative for rash. Neurological: Negative for focal weakness or numbness. 10 point Review of Systems otherwise negative ____________________________________________   PHYSICAL EXAM:  VITAL SIGNS: ED Triage Vitals  Enc Vitals Group     BP 06/29/15 2131 107/70 mmHg     Pulse Rate 06/29/15 2131 75     Resp 06/29/15 2131 18     Temp 06/29/15 2131 98.2 F (36.8 C)     Temp Source 06/29/15 2131 Oral     SpO2 06/29/15 2131 100 %     Weight 06/29/15 2131 152  lb (68.947 kg)     Height 06/29/15 2131 5\' 7"  (1.702 m)     Head Cir --      Peak Flow --      Pain Score 06/29/15 2131 5     Pain Loc --      Pain Edu? --      Excl. in [place]? --      Constitutional: Alert and oriented. Well appearing and in no distress. Eyes: Conjunctivae are normal. PERRL. Normal extraocular movements. ENT   Head: Normocephalic. Small early hematoma to the left forehead.   Nose: No congestion/rhinnorhea.   Mouth/Throat: Mucous membranes are moist.   Neck: No stridor. Cardiovascular/Chest: Normal rate, regular rhythm.  No murmurs, rubs, or gallops. Respiratory: Normal respiratory effort without tachypnea nor retractions. Breath sounds are clear and equal bilaterally. No  wheezes/rales/rhonchi. Gastrointestinal: Soft. No distention, no guarding, no rebound. Nontender   Genitourinary/rectal:Deferred Musculoskeletal: Nontender with normal range of motion in all extremities. No joint effusions.  No lower extremity tenderness nor edema. Neurologic:  Normal speech and language. No gross or focal neurologic deficits are appreciated. Skin:  Skin is warm, dry and intact. No rash noted. Psychiatric: Mood and affect are normal. Speech and behavior are normal. Patient exhibits appropriate insight and judgment.  ____________________________________________   EKG I, Lisa Roca, MD, the attending physician have personally viewed and interpreted all ECGs.  75 bpm Normal sinus rhythm. Narrow QRS. Normal axis. Normal ST and T-wave. QTc 428. No evidence of Brugada or Wolff-Parkinson-White ____________________________________________  LABS (pertinent positives/negatives)  Metabolic panel within normal limits CBC within normal limits Urinalysis 6-30 white blood cells, no bacteria  ____________________________________________  RADIOLOGY All Xrays were viewed by me. Imaging interpreted by Radiologist.  None __________________________________________  PROCEDURES  Procedure(s) performed: None Critical Care performed: None  ____________________________________________   ED COURSE / ASSESSMENT AND PLAN  CONSULTATIONS: None  Pertinent labs & imaging results that were available during my care of the patient were reviewed by me and considered in my medical decision making (see chart for details).   Patient has had a history of syncope in the past, I do think this was a syncope rather than seizure. I suspect this was vasovagal or orthostatic given the fact that she is reporting not eating and drinking that much and she is exclusively breast-feeding. In terms of the head injury, there is a brief loss of consciousness, and now she is neurologically intact and I do  not suspect intracranial traumatic injury. I discussed return precautions with her with regard to this.  Orthostatics negative. Blood work reassuring. Urinalysis reassuring.  Patient / Family / Caregiver informed of clinical course, medical decision-making process, and agree with plan.   I discussed return precautions, follow-up instructions, and discharged instructions with patient and/or family.  ___________________________________________   FINAL CLINICAL IMPRESSION(S) / ED DIAGNOSES   Final diagnoses:  Syncope, unspecified syncope type    FOLLOW UP  Referred to: Primary care physician, one week   Lisa Roca, MD 06/29/15 2253

## 2015-09-04 ENCOUNTER — Emergency Department
Admission: EM | Admit: 2015-09-04 | Discharge: 2015-09-04 | Disposition: A | Discharge status: Home / Self Care | Payer: Medicaid Other | Attending: Emergency Medicine | Admitting: Emergency Medicine

## 2015-09-04 DIAGNOSIS — Z79899 Other long term (current) drug therapy: Secondary | ICD-10-CM | POA: Diagnosis not present

## 2015-09-04 DIAGNOSIS — Z3202 Encounter for pregnancy test, result negative: Secondary | ICD-10-CM | POA: Diagnosis not present

## 2015-09-04 DIAGNOSIS — R55 Syncope and collapse: Secondary | ICD-10-CM | POA: Insufficient documentation

## 2015-09-04 HISTORY — DX: Malignant (primary) neoplasm, unspecified: C80.1

## 2015-09-04 LAB — CBC WITH DIFFERENTIAL/PLATELET
Basophils Absolute: 0 10*3/uL (ref 0–0.1)
Basophils Relative: 1 %
EOS PCT: 1 %
Eosinophils Absolute: 0 10*3/uL (ref 0–0.7)
HCT: 38.8 % (ref 35.0–47.0)
Hemoglobin: 13.5 g/dL (ref 12.0–16.0)
LYMPHS ABS: 1.5 10*3/uL (ref 1.0–3.6)
LYMPHS PCT: 31 %
MCH: 31.6 pg (ref 26.0–34.0)
MCHC: 34.8 g/dL (ref 32.0–36.0)
MCV: 90.6 fL (ref 80.0–100.0)
MONO ABS: 0.4 10*3/uL (ref 0.2–0.9)
MONOS PCT: 8 %
Neutro Abs: 3 10*3/uL (ref 1.4–6.5)
Neutrophils Relative %: 59 %
PLATELETS: 195 10*3/uL (ref 150–440)
RBC: 4.29 MIL/uL (ref 3.80–5.20)
RDW: 12.8 % (ref 11.5–14.5)
WBC: 5 10*3/uL (ref 3.6–11.0)

## 2015-09-04 LAB — URINALYSIS COMPLETE WITH MICROSCOPIC (ARMC ONLY)
BACTERIA UA: NONE SEEN
Bilirubin Urine: NEGATIVE
GLUCOSE, UA: NEGATIVE mg/dL
HGB URINE DIPSTICK: NEGATIVE
Ketones, ur: NEGATIVE mg/dL
Leukocytes, UA: NEGATIVE
Nitrite: NEGATIVE
PROTEIN: NEGATIVE mg/dL
Specific Gravity, Urine: 1.017 (ref 1.005–1.030)
pH: 7 (ref 5.0–8.0)

## 2015-09-04 LAB — BASIC METABOLIC PANEL
Anion gap: 9 (ref 5–15)
BUN: 14 mg/dL (ref 6–20)
CALCIUM: 9.1 mg/dL (ref 8.9–10.3)
CO2: 24 mmol/L (ref 22–32)
Chloride: 107 mmol/L (ref 101–111)
Creatinine, Ser: 0.49 mg/dL (ref 0.44–1.00)
GFR calc Af Amer: >60 mL/min (ref >60)
GLUCOSE: 87 mg/dL (ref 65–99)
Potassium: 3.8 mmol/L (ref 3.5–5.1)
Sodium: 140 mmol/L (ref 135–145)

## 2015-09-04 LAB — GLUCOSE, CAPILLARY: Glucose-Capillary: 77 mg/dL (ref 65–99)

## 2015-09-04 LAB — POCT PREGNANCY, URINE: PREG TEST UR: NEGATIVE

## 2015-09-04 MED ORDER — SODIUM CHLORIDE 0.9 % IV BOLUS (SEPSIS): 1000.0000 mL | Freq: Once | INTRAVENOUS | Status: DC

## 2015-09-04 MED ORDER — SERTRALINE HCL 25 MG PO TABS: 50.0000 mg | ORAL_TABLET | Freq: Every day | ORAL | Status: DC
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
## 2015-09-04 NOTE — Discharge Instructions (Signed)

## 2015-09-04 NOTE — ED Provider Notes (Addendum)
[facility] Emergency Department Provider Note  ____________________________________________   I have reviewed the triage vital signs and the nursing notes.   HISTORY  Chief Complaint Loss of Consciousness    HPI Latoya Luna is a 29 y.o. female who states she passed out. Patient has passed out "more times and I can count". Patient has no history of seizures. She has seen cardiology for this, no family history of sudden cardiac death, she has followed with neurology for this, she has had extensive workup including Holter monitor, and she is told that this is possibly due to stress. Patient does admit to being under stress. She has multiple family stressors. She does not endorse SI or HI states that she will not harm herself however she does state that she has postpartum depression and one off of her medications. She states in the past she has had thoughts of herself but does not have any now. She has never tried to kill her self she states. She denies a chest pain shortness of breath or injury to her head. She has no other complaints she denies pregnancy. She is hungry has had nothing to eat or drink today.  Past Medical History  Diagnosis Date  . Seizures ([place])     last seizure 5 years ago  . Anxiety   . Depression   . Cancer [facility])     Patient Active Problem List   Diagnosis Date Noted  . Labor and delivery, indication for care 04/09/2015  . Indication for care or intervention related to labor and delivery 04/09/2015  . Vaginal delivery 04/09/2015  . 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 Episode of syncope 11/04/2014  . History of brain disorder 09/04/2014    Past Surgical History  Procedure Laterality Date  . No past surgeries      Current Outpatient Rx  Name  Route  Sig  Dispense  Refill  . ibuprofen (ADVIL,MOTRIN) 600 MG tablet   Oral   Take 1 tablet (600 mg total) by mouth every 6 (six) hours.   30 tablet   0   . LORazepam (ATIVAN) 0.5 MG  tablet   Oral   Take 0.5 mg by mouth every 8 (eight) hours as needed for anxiety.         Marland Kitchen oxyCODONE-acetaminophen (PERCOCET/ROXICET) 5-325 MG per tablet   Oral   Take 1 tablet by mouth every 4 (four) hours as needed (for pain scale 4-7).   15 tablet   0   . Prenatal Vit-Fe Fumarate-FA (PRENATAL MULTIVITAMIN) TABS tablet   Oral   Take 1 tablet by mouth daily at 12 noon.         . sertraline (ZOLOFT) 25 MG tablet   Oral   Take 1 tablet (25 mg total) by mouth daily.   90 tablet   3     Allergies Review of patient's allergies indicates no known allergies.  History reviewed. No pertinent family history.  Social History Social History  Substance Use Topics  . Smoking status: Never Smoker   . Smokeless tobacco: None  . Alcohol Use: No    Review of Systems Constitutional: No fever/chills Eyes: No visual changes. ENT: No sore throat. No stiff neck no neck pain Cardiovascular: Denies chest pain. Respiratory: Denies shortness of breath. Gastrointestinal:   no vomiting.  No diarrhea.  No constipation. Genitourinary: Negative for dysuria. Musculoskeletal: Negative lower extremity swelling Skin: Negative for rash. Neurological: Negative for headaches, focal weakness or numbness. 10-point ROS otherwise negative.  ____________________________________________   PHYSICAL EXAM:  VITAL SIGNS: ED Triage Vitals  Enc Vitals Group     BP 09/04/15 1609 106/67 mmHg     Pulse Rate 09/04/15 1609 80     Resp 09/04/15 1609 18     Temp 09/04/15 1609 97.4 F (36.3 C)     Temp Source 09/04/15 1609 Oral     SpO2 09/04/15 1609 100 %     Weight 09/04/15 1609 140 lb (63.504 kg)     Height 09/04/15 1609 5\' 7"  (1.702 m)     Head Cir --      Peak Flow --      Pain Score --      Pain Loc --      Pain Edu? --      Excl. in [place]? --     Constitutional: Alert and oriented. Well appearing and in no acute distress. Eyes: Conjunctivae are normal. PERRL. EOMI. Head: Atraumatic. Nose:  No congestion/rhinnorhea. Mouth/Throat: Mucous membranes are moist.  Oropharynx non-erythematous. Neck: No stridor.   Nontender with no meningismus Cardiovascular: Normal rate, regular rhythm. Grossly normal heart sounds.  Good peripheral circulation. Respiratory: Normal respiratory effort.  No retractions. Lungs CTAB. Gastrointestinal: Soft and nontender. No distention. No guarding no rebound Back:  There is no focal tenderness or step off there is no midline tenderness there are no lesions noted. there is no CVA tenderness Musculoskeletal: No lower extremity tenderness. No joint effusions, no DVT signs strong distal pulses no edema Neurologic:  Normal speech and language. No gross focal neurologic deficits are appreciated.  Skin:  Skin is warm, dry and intact. No rash noted. Psychiatric: Mood and affect are somewhat anxious. Speech and behavior are normal.  ____________________________________________   LABS (all labs ordered are listed, but only abnormal results are displayed)  Labs Reviewed  CBC WITH DIFFERENTIAL/PLATELET  BASIC METABOLIC PANEL  GLUCOSE, CAPILLARY  URINALYSIS COMPLETEWITH MICROSCOPIC ([place])  POCT CBG (FASTING - GLUCOSE)-MANUAL ENTRY  POC URINE PREG, ED   ____________________________________________  EKG  Normal sinus rhythm rate 81 bpm no acute ST elevation or acute ST depression a specific T wave changes have personally reviewed this ____________________________________________  RADIOLOGY  N/A ____________________________________________   PROCEDURES  Procedure(s) performed: None  Critical Care performed: None  ____________________________________________   INITIAL IMPRESSION / ASSESSMENT AND PLAN / ED COURSE  Pertinent labs & imaging results that were available during my care of the patient were reviewed by me and considered in my medical decision making (see chart for details).  Patient with multiple histories of syncope going back  many years with extensive outpatient workup who had another syncopal event. I've instructed her not to drive and she knows she must not drive. Patient is in no acute distress eating food at this time. Vital signs are reassuring. She states her blood pressure does run low. We are giving her IV fluid as she has not had much to eat or drink today. I do not think I consult the sink to be prominent today patient will follow up again with the experts at her already taking care of this. Patient does have multiple family stressors and stop taking her antidepressants. She has no frank SI or HI, and she adamantly denies being physically abused and also any thoughts of hurting her children, I am always concerned about noncompliance in people who seemed as if they're suffering from depression and I have discussed with psychiatry and they will come and evaluate the patient prior to discharge. I  do not think at this time she requires IVC. ____________________________________________ ----------------------------------------- 6:35 PM on 09/04/2015 -----------------------------------------  Very considerately evaluated by psychiatry for postpartum depression. They do not feel that there is any indication for inpatient treatment and I agree. Patient will follow up as an outpatient and she will return for new or worrisome symptoms. She is a symptomatically and again this is a chronic issue. We will ensure that she can avidly with no difficulty and I can have counseled her not to drive.  FINAL CLINICAL IMPRESSION(S) / ED DIAGNOSES  Final diagnoses:  None     Schuyler Amor, MD 09/04/15 [place], MD 09/04/15 [phone]

## 2015-09-08 MED ORDER — MORPHINE SULFATE (PF) 4 MG/ML IV SOLN
INTRAVENOUS | Status: AC
  Filled 2015-09-08: qty 1

## 2015-09-08 MED ORDER — PROMETHAZINE HCL 25 MG/ML IJ SOLN
INTRAMUSCULAR | Status: AC
Start: 2015-09-08 — End: 2015-09-09
  Filled 2015-09-08: qty 1

## 2015-09-20 ENCOUNTER — Emergency Department
Admission: EM | Admit: 2015-09-20 | Discharge: 2015-09-21 | Disposition: A | Discharge status: Home / Self Care | Payer: Medicaid Other | Attending: Emergency Medicine | Admitting: Emergency Medicine

## 2015-09-20 ENCOUNTER — Encounter: Payer: Self-pay | Admitting: *Deleted

## 2015-09-20 ENCOUNTER — Emergency Department: Payer: Medicaid Other

## 2015-09-20 DIAGNOSIS — Y998 Other external cause status: Secondary | ICD-10-CM | POA: Insufficient documentation

## 2015-09-20 DIAGNOSIS — Z79899 Other long term (current) drug therapy: Secondary | ICD-10-CM | POA: Insufficient documentation

## 2015-09-20 DIAGNOSIS — W01198A Fall on same level from slipping, tripping and stumbling with subsequent striking against other object, initial encounter: Secondary | ICD-10-CM | POA: Diagnosis not present

## 2015-09-20 DIAGNOSIS — R55 Syncope and collapse: Secondary | ICD-10-CM | POA: Diagnosis not present

## 2015-09-20 DIAGNOSIS — S4991XA Unspecified injury of right shoulder and upper arm, initial encounter: Secondary | ICD-10-CM | POA: Insufficient documentation

## 2015-09-20 DIAGNOSIS — Y9289 Other specified places as the place of occurrence of the external cause: Secondary | ICD-10-CM | POA: Diagnosis not present

## 2015-09-20 DIAGNOSIS — S199XXA Unspecified injury of neck, initial encounter: Secondary | ICD-10-CM | POA: Diagnosis not present

## 2015-09-20 DIAGNOSIS — Y9389 Activity, other specified: Secondary | ICD-10-CM | POA: Insufficient documentation

## 2015-09-20 DIAGNOSIS — S0990XA Unspecified injury of head, initial encounter: Secondary | ICD-10-CM

## 2015-09-20 DIAGNOSIS — Z3202 Encounter for pregnancy test, result negative: Secondary | ICD-10-CM | POA: Diagnosis not present

## 2015-09-20 DIAGNOSIS — M7918 Myalgia, other site: Secondary | ICD-10-CM

## 2015-09-20 LAB — CBC
HCT: 41.5 % (ref 35.0–47.0)
HEMOGLOBIN: 14.4 g/dL (ref 12.0–16.0)
MCH: 31.5 pg (ref 26.0–34.0)
MCHC: 34.8 g/dL (ref 32.0–36.0)
MCV: 90.5 fL (ref 80.0–100.0)
PLATELETS: 237 10*3/uL (ref 150–440)
RBC: 4.59 MIL/uL (ref 3.80–5.20)
RDW: 12.5 % (ref 11.5–14.5)
WBC: 6 10*3/uL (ref 3.6–11.0)

## 2015-09-20 LAB — BASIC METABOLIC PANEL
ANION GAP: 9 (ref 5–15)
BUN: 16 mg/dL (ref 6–20)
CO2: 26 mmol/L (ref 22–32)
CREATININE: 0.56 mg/dL (ref 0.44–1.00)
Calcium: 9 mg/dL (ref 8.9–10.3)
Chloride: 104 mmol/L (ref 101–111)
GLUCOSE: 105 mg/dL — AB (ref 65–99)
Potassium: 3.5 mmol/L (ref 3.5–5.1)
Sodium: 139 mmol/L (ref 135–145)

## 2015-09-20 LAB — TROPONIN I

## 2015-09-20 MED ORDER — MAGNESIUM SULFATE 2 GM/50ML IV SOLN
2.0000 g | Freq: Once | INTRAVENOUS | Status: AC
  Administered 2015-09-21: 2 g via INTRAVENOUS
  Filled 2015-09-20: qty 50

## 2015-09-20 MED ORDER — IBUPROFEN 800 MG PO TABS
800.0000 mg | ORAL_TABLET | Freq: Once | ORAL | Status: AC
  Administered 2015-09-20: 800 mg via ORAL
  Filled 2015-09-20: qty 1

## 2015-09-20 NOTE — ED Notes (Signed)
Pt arrives via [facility]. Pt has been having near syncopal episodes "almost weekly" since December, has been evaluated for the same by her PCP without clear diagnosis. Pt was at a restaurant , felt weak, dizzy, and felt like she was going to pass out, she leaned forward and hit the right side of her head and neck on a table. She was placed in a ccollar prior to arrival.

## 2015-09-20 NOTE — ED Provider Notes (Signed)
[facility] Emergency Department Provider Note  ____________________________________________  Time seen: Approximately 11:19 PM  I have reviewed the triage vital signs and the nursing notes.   HISTORY  Chief Complaint Near Syncope    HPI Latoya Luna is a 29 y.o. female who comes in today with head and neck pain after passing out. The patient reports that she was feeling dizzy and started feeling weird before she fell and hit her head and neck. The patient reports that this occurred around 6 PM. The patient reports that she came here for evaluation. She is unsure how long she was unconscious for. The patient has had multiple syncopal events over the past year and reports that she has been worked up for them in the past. The patient last saw her primary care physician 1-2 weeks ago and reports she has been seen by specialist regarding her syncope. The patient reports that the pain in her head is 8 out of 10 in intensity. She has some right shoulder pain and soreness that she reports is less painful. She reports that she fell flat on the pavement on her right side. The patient denies any chest pain, shortness of breath or abdominal pain but reports that right now she feels nauseous. The patient also reports that from hitting her head she does have a headache. She also reports she always has blurred vision with her syncope as well as some dizziness. The patient came in because she was hurting in her neck and her head.   Past Medical History  Diagnosis Date  . Seizures ([place])     last seizure 5 years ago  . Anxiety   . Depression   . Cancer [facility])     Patient Active Problem List   Diagnosis Date Noted  . Labor and delivery, indication for care 04/09/2015  . Indication for care or intervention related to labor and delivery 04/09/2015  . Vaginal delivery 04/09/2015  . 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 Episode of syncope 11/04/2014  . History of brain disorder  09/04/2014    Past Surgical History  Procedure Laterality Date  . No past surgeries      Current Outpatient Rx  Name  Route  Sig  Dispense  Refill  . LORazepam (ATIVAN) 0.5 MG tablet   Oral   Take 0.5 mg by mouth every 8 (eight) hours as needed for anxiety.         . Prenatal Vit-Fe Fumarate-FA (PRENATAL MULTIVITAMIN) TABS tablet   Oral   Take 1 tablet by mouth daily at 12 noon.         . sertraline (ZOLOFT) 25 MG tablet   Oral   Take 2 tablets (50 mg total) by mouth daily.   30 tablet   0   . ibuprofen (ADVIL,MOTRIN) 600 MG tablet   Oral   Take 1 tablet (600 mg total) by mouth every 6 (six) hours.   30 tablet   0     Allergies Review of patient's allergies indicates no known allergies.  No family history on file.  Social History Social History  Substance Use Topics  . Smoking status: Never Smoker   . Smokeless tobacco: None  . Alcohol Use: No    Review of Systems Constitutional: No fever/chills Eyes: No visual changes. ENT: No sore throat. Cardiovascular: Denies chest pain. Respiratory: Denies shortness of breath. Gastrointestinal: Nausea with No abdominal pain. no vomiting.  No diarrhea.  No constipation. Genitourinary: Negative for dysuria. Musculoskeletal: Neck pain  and right shoulder pain Skin: Negative for rash. Neurological: Headache, syncope, dizziness  10-point ROS otherwise negative.  ____________________________________________   PHYSICAL EXAM:  VITAL SIGNS: ED Triage Vitals  Enc Vitals Group     BP 09/20/15 2012 105/69 mmHg     Pulse Rate 09/20/15 2012 99     Resp 09/20/15 2012 20     Temp 09/20/15 2012 98.7 F (37.1 C)     Temp Source 09/20/15 2012 Oral     SpO2 09/20/15 2012 98 %     Weight 09/20/15 2012 148 lb (67.132 kg)     Height 09/20/15 2012 5\' 7"  (1.702 m)     Head Cir --      Peak Flow --      Pain Score 09/20/15 2012 8     Pain Loc --      Pain Edu? --      Excl. in [place]? --     Constitutional: Alert and  oriented. Well appearing and in no acute distress. Eyes: Conjunctivae are normal. PERRL. EOMI. Head: Atraumatic. Nose: No congestion/rhinnorhea. Neck: Midline mid to lower C-spine tenderness to palpation. Mouth/Throat: Mucous membranes are moist.  Oropharynx non-erythematous. Cardiovascular: Normal rate, regular rhythm. Grossly normal heart sounds.  Good peripheral circulation. Respiratory: Normal respiratory effort.  No retractions. Lungs CTAB. Gastrointestinal: Soft and nontender. No distention. Positive bowel sounds Musculoskeletal: Mild tenderness to palpation along anterior shoulder from mid back to shoulder. No significant bruising noted. Neurologic:  Normal speech and language. No gross focal neurologic deficits are appreciated.  Skin:  Skin is warm, dry and intact. Psychiatric: Mood and affect are normal.   ____________________________________________   LABS (all labs ordered are listed, but only abnormal results are displayed)  Labs Reviewed  BASIC METABOLIC PANEL - Abnormal; Notable for the following:    Glucose, Bld 105 (*)    All other components within normal limits  URINALYSIS COMPLETEWITH MICROSCOPIC ([facility] ONLY) - Abnormal; Notable for the following:    Color, Urine YELLOW (*)    APPearance CLOUDY (*)    Leukocytes, UA 1+ (*)    Bacteria, UA RARE (*)    Squamous Epithelial / LPF 0-5 (*)    All other components within normal limits  CULTURE, GROUP A STREP ([facility] ONLY)  CBC  TROPONIN I  MAGNESIUM  POC URINE PREG, ED  POCT PREGNANCY, URINE  POCT RAPID STREP A   ____________________________________________  EKG  ED ECG REPORT I, Loney Hering, the attending physician, personally viewed and interpreted this ECG.   Date: 09/20/2015  EKG Time: 2017  Rate: 94  Rhythm: normal sinus rhythm  Axis: normal  Intervals:prolonged qtc  ST&T Change: none  ED ECG REPORT I, Loney Hering, the attending physician, personally viewed and interpreted this  ECG.   Date: 09/21/2015  EKG Time: 202  Rate: 77  Rhythm: normal sinus rhythm  Axis: normal  Intervals:none  ST&T Change: none   ____________________________________________  RADIOLOGY  CT head and cervical spine: Unremarkable head CT, no acute osseous abnormality identified in the cervical spine Right shoulder x-ray: No acute radiographic abnormality of the right shoulder ____________________________________________   PROCEDURES  Procedure(s) performed: None  Critical Care performed: No  ____________________________________________   INITIAL IMPRESSION / ASSESSMENT AND PLAN / ED COURSE  Pertinent labs & imaging results that were available during my care of the patient were reviewed by me and considered in my medical decision making (see chart for details).  This is a 29 year old female with  a history of multiple syncopal episodes he comes in tonight with a subsequent syncopal episode and head, neck and shoulder pain. The patient did have some scans of her head and neck as well as an x-ray of her shoulder which were all negative. The patient's EKG showed a prolonged QTC I will check the patient's mag level and give her a dose of mag 2 g as well as repeat her EKG. I will also give the patient some ibuprofen for her headache and reassess the patient.  The patient's repeat EKG does not have prolonged QTC. I will discharge the patient to home and have her follow back up with her primary care physician.  The patient's repeat EKG was unremarkable. The patient no longer had prolonged QTC. The patient will be discharged home to follow-up with her primary care physician. ____________________________________________   FINAL CLINICAL IMPRESSION(S) / ED DIAGNOSES  Final diagnoses:  Syncope, unspecified syncope type  Head injury, initial encounter  Musculoskeletal pain      Loney Hering, MD 09/21/15 [phone]

## 2015-09-20 NOTE — ED Notes (Signed)
"  I passed out."  Patient reports landed on cement patio when she fell.  Pt in c-collar by EMS.  Patient complains of head, neck and right shoulder pain.

## 2015-09-20 NOTE — ED Notes (Signed)
Pt uprite on stretcher in exam room with no distress noted; orthostatic vs performed as ordered; pt tolerated well with some dizziness reported upon standing; pt c/o generalized HA 8/10; med admin as ordered; pt up to room commode for cc urine; c-collar remains in place per MD

## 2015-09-21 ENCOUNTER — Other Ambulatory Visit: Payer: Self-pay

## 2015-09-21 LAB — URINALYSIS COMPLETE WITH MICROSCOPIC (ARMC ONLY)
Bilirubin Urine: NEGATIVE
Glucose, UA: NEGATIVE mg/dL
HGB URINE DIPSTICK: NEGATIVE
KETONES UR: NEGATIVE mg/dL
NITRITE: NEGATIVE
PH: 8 (ref 5.0–8.0)
PROTEIN: NEGATIVE mg/dL
SPECIFIC GRAVITY, URINE: 1.017 (ref 1.005–1.030)

## 2015-09-21 LAB — POCT RAPID STREP A: Streptococcus, Group A Screen (Direct): NEGATIVE

## 2015-09-21 LAB — MAGNESIUM: MAGNESIUM: 2.2 mg/dL (ref 1.7–2.4)

## 2015-09-21 LAB — POCT PREGNANCY, URINE: PREG TEST UR: NEGATIVE

## 2015-09-21 NOTE — ED Notes (Signed)
Pt reports recent exposure to strep and now with throat irritation; MD notified and strep test ordered; pt given sandwich tray and sprite at request and per MD; ice bags given to pt to store breast milk pumped while in room; c-collar removed per MD

## 2015-09-21 NOTE — Discharge Instructions (Signed)
Head Injury, Adult You have a head injury. Headaches and throwing up (vomiting) are common after a head injury. It should be easy to wake up from sleeping. Sometimes you must stay in the hospital. Most problems happen within the first 24 hours. Side effects may occur up to 7-10 days after the injury.  WHAT ARE THE TYPES OF HEAD INJURIES? Head injuries can be as minor as a bump. Some head injuries can be more severe. More severe head injuries include:  A jarring injury to the brain (concussion).  A bruise of the brain (contusion). This mean there is bleeding in the brain that can cause swelling.  A cracked skull (skull fracture).  Bleeding in the brain that collects, clots, and forms a bump (hematoma). WHEN SHOULD I GET HELP RIGHT AWAY?   You are confused or sleepy.  You cannot be woken up.  You feel sick to your stomach (nauseous) or keep throwing up (vomiting).  Your dizziness or unsteadiness is getting worse.  You have very bad, lasting headaches that are not helped by medicine. Take medicines only as told by your doctor.  You cannot use your arms or legs like normal.  You cannot walk.  You notice changes in the black spots in the center of the colored part of your eye (pupil).  You have clear or bloody fluid coming from your nose or ears.  You have trouble seeing. During the next 24 hours after the injury, you must stay with someone who can watch you. This person should get help right away (call 911 in the [place] if you start to shake and are not able to control it (have seizures), you pass out, or you are unable to wake up. HOW CAN I PREVENT A HEAD INJURY IN THE FUTURE?  Wear seat belts.  Wear a helmet while bike riding and playing sports like football.  Stay away from dangerous activities around the house. WHEN CAN I RETURN TO NORMAL ACTIVITIES AND ATHLETICS? See your doctor before doing these activities. You should not do normal activities or play contact sports until 1  week after the following symptoms have stopped:  Headache that does not go away.  Dizziness.  Poor attention.  Confusion.  Memory problems.  Sickness to your stomach or throwing up.  Tiredness.  Fussiness.  Bothered by bright lights or loud noises.  Anxiousness or depression.  Restless sleep. MAKE SURE YOU:   Understand these instructions.  Will watch your condition.  Will get help right away if you are not doing well or get worse.   This information is not intended to replace advice given to you by your health care provider. Make sure you discuss any questions you have with your health care provider.   Document Released: 10/27/2008 Document Revised: 12/05/2014 Document Reviewed: 07/22/2013 Elsevier Interactive Patient Education 2016 [employer]  Musculoskeletal Pain Musculoskeletal pain is muscle and boney aches and pains. These pains can occur in any part of the body. Your caregiver may treat you without knowing the cause of the pain. They may treat you if blood or urine tests, X-rays, and other tests were normal.  CAUSES There is often not a definite cause or reason for these pains. These pains may be caused by a type of germ (virus). The discomfort may also come from overuse. Overuse includes working out too hard when your body is not fit. Boney aches also come from weather changes. Bone is sensitive to atmospheric pressure changes. HOME CARE INSTRUCTIONS   Ask  when your test results will be ready. Make sure you get your test results.  Only take over-the-counter or prescription medicines for pain, discomfort, or fever as directed by your caregiver. If you were given medications for your condition, do not drive, operate machinery or power tools, or sign legal documents for 24 hours. Do not drink alcohol. Do not take sleeping pills or other medications that may interfere with treatment.  Continue all activities unless the activities cause more pain. When the pain  lessens, slowly resume normal activities. Gradually increase the intensity and duration of the activities or exercise.  During periods of severe pain, bed rest may be helpful. Lay or sit in any position that is comfortable.  Putting ice on the injured area.  Put ice in a bag.  Place a towel between your skin and the bag.  Leave the ice on for 15 to 20 minutes, 3 to 4 times a day.  Follow up with your caregiver for continued problems and no reason can be found for the pain. If the pain becomes worse or does not go away, it may be necessary to repeat tests or do additional testing. Your caregiver may need to look further for a possible cause. SEEK IMMEDIATE MEDICAL CARE IF:  You have pain that is getting worse and is not relieved by medications.  You develop chest pain that is associated with shortness or breath, sweating, feeling sick to your stomach (nauseous), or throw up (vomit).  Your pain becomes localized to the abdomen.  You develop any new symptoms that seem different or that concern you. MAKE SURE YOU:   Understand these instructions.  Will watch your condition.  Will get help right away if you are not doing well or get worse.   This information is not intended to replace advice given to you by your health care provider. Make sure you discuss any questions you have with your health care provider.   Document Released: 11/14/2005 Document Revised: 02/06/2012 Document Reviewed: 07/19/2013 Elsevier Interactive Patient Education 2016 [employer].  Syncope Syncope is a medical term for fainting or passing out. This means you lose consciousness and drop to the ground. People are generally unconscious for less than 5 minutes. You may have some muscle twitches for up to 15 seconds before waking up and returning to normal. Syncope occurs more often in older adults, but it can happen to anyone. While most causes of syncope are not dangerous, syncope can be a sign of a serious  medical problem. It is important to seek medical care.  CAUSES  Syncope is caused by a sudden drop in blood flow to the brain. The specific cause is often not determined. Factors that can bring on syncope include:  Taking medicines that lower blood pressure.  Sudden changes in posture, such as standing up quickly.  Taking more medicine than prescribed.  Standing in one place for too long.  Seizure disorders.  Dehydration and excessive exposure to heat.  Low blood sugar (hypoglycemia).  Straining to have a bowel movement.  Heart disease, irregular heartbeat, or other circulatory problems.  Fear, emotional distress, seeing blood, or severe pain. SYMPTOMS  Right before fainting, you may:  Feel dizzy or light-headed.  Feel nauseous.  See all white or all black in your field of vision.  Have cold, clammy skin. DIAGNOSIS  Your health care provider will ask about your symptoms, perform a physical exam, and perform an electrocardiogram (ECG) to record the electrical activity of your heart. Your  health care provider may also perform other heart or blood tests to determine the cause of your syncope which may include:  Transthoracic echocardiogram (TTE). During echocardiography, sound waves are used to evaluate how blood flows through your heart.  Transesophageal echocardiogram (TEE).  Cardiac monitoring. This allows your health care provider to monitor your heart rate and rhythm in real time.  Holter monitor. This is a portable device that records your heartbeat and can help diagnose heart arrhythmias. It allows your health care provider to track your heart activity for several days, if needed.  Stress tests by exercise or by giving medicine that makes the heart beat faster. TREATMENT  In most cases, no treatment is needed. Depending on the cause of your syncope, your health care provider may recommend changing or stopping some of your medicines. HOME CARE INSTRUCTIONS  Have  someone stay with you until you feel stable.  Do not drive, use machinery, or play sports until your health care provider says it is okay.  Keep all follow-up appointments as directed by your health care provider.  Lie down right away if you start feeling like you might faint. Breathe deeply and steadily. Wait until all the symptoms have passed.  Drink enough fluids to keep your urine clear or pale yellow.  If you are taking blood pressure or heart medicine, get up slowly and take several minutes to sit and then stand. This can reduce dizziness. SEEK IMMEDIATE MEDICAL CARE IF:   You have a severe headache.  You have unusual pain in the chest, abdomen, or back.  You are bleeding from your mouth or rectum, or you have black or tarry stool.  You have an irregular or very fast heartbeat.  You have pain with breathing.  You have repeated fainting or seizure-like jerking during an episode.  You faint when sitting or lying down.  You have confusion.  You have trouble walking.  You have severe weakness.  You have vision problems. If you fainted, call your local emergency services (911 in [place] Do not drive yourself to the hospital.    This information is not intended to replace advice given to you by your health care provider. Make sure you discuss any questions you have with your health care provider.   Document Released: 11/14/2005 Document Revised: 03/31/2015 Document Reviewed: 01/13/2012 Elsevier Interactive Patient Education [employer].

## 2015-09-23 LAB — CULTURE, GROUP A STREP (THRC)

## 2015-10-21 ENCOUNTER — Emergency Department
Admission: EM | Admit: 2015-10-21 | Discharge: 2015-10-21 | Disposition: A | Discharge status: Home / Self Care | Payer: Medicaid Other | Attending: Student | Admitting: Student

## 2015-10-21 ENCOUNTER — Encounter: Payer: Self-pay | Admitting: Emergency Medicine

## 2015-10-21 ENCOUNTER — Emergency Department: Payer: Medicaid Other

## 2015-10-21 DIAGNOSIS — R112 Nausea with vomiting, unspecified: Secondary | ICD-10-CM | POA: Diagnosis not present

## 2015-10-21 DIAGNOSIS — R55 Syncope and collapse: Secondary | ICD-10-CM

## 2015-10-21 DIAGNOSIS — R197 Diarrhea, unspecified: Secondary | ICD-10-CM | POA: Insufficient documentation

## 2015-10-21 DIAGNOSIS — R42 Dizziness and giddiness: Secondary | ICD-10-CM | POA: Diagnosis not present

## 2015-10-21 DIAGNOSIS — Z79899 Other long term (current) drug therapy: Secondary | ICD-10-CM | POA: Diagnosis not present

## 2015-10-21 DIAGNOSIS — R1110 Vomiting, unspecified: Secondary | ICD-10-CM

## 2015-10-21 DIAGNOSIS — Z3202 Encounter for pregnancy test, result negative: Secondary | ICD-10-CM | POA: Diagnosis not present

## 2015-10-21 LAB — CBC WITH DIFFERENTIAL/PLATELET
BASOS ABS: 0 10*3/uL (ref 0–0.1)
Basophils Relative: 0 %
EOS PCT: 0 %
Eosinophils Absolute: 0 10*3/uL (ref 0–0.7)
HEMATOCRIT: 39.5 % (ref 35.0–47.0)
HEMOGLOBIN: 13.1 g/dL (ref 12.0–16.0)
LYMPHS PCT: 9 %
Lymphs Abs: 0.5 10*3/uL — ABNORMAL LOW (ref 1.0–3.6)
MCH: 30.1 pg (ref 26.0–34.0)
MCHC: 33.2 g/dL (ref 32.0–36.0)
MCV: 90.7 fL (ref 80.0–100.0)
Monocytes Absolute: 0.3 10*3/uL (ref 0.2–0.9)
Monocytes Relative: 5 %
Neutro Abs: 4.3 10*3/uL (ref 1.4–6.5)
Neutrophils Relative %: 86 %
Platelets: 184 10*3/uL (ref 150–440)
RBC: 4.35 MIL/uL (ref 3.80–5.20)
RDW: 12.8 % (ref 11.5–14.5)
WBC: 5 10*3/uL (ref 3.6–11.0)

## 2015-10-21 LAB — COMPREHENSIVE METABOLIC PANEL
ALK PHOS: 65 U/L (ref 38–126)
ALT: 19 U/L (ref 14–54)
AST: 20 U/L (ref 15–41)
Albumin: 3.8 g/dL (ref 3.5–5.0)
Anion gap: 9 (ref 5–15)
BUN: 17 mg/dL (ref 6–20)
CALCIUM: 7.9 mg/dL — AB (ref 8.9–10.3)
CHLORIDE: 108 mmol/L (ref 101–111)
CO2: 23 mmol/L (ref 22–32)
CREATININE: 0.63 mg/dL (ref 0.44–1.00)
Glucose, Bld: 91 mg/dL (ref 65–99)
Potassium: 3.4 mmol/L — ABNORMAL LOW (ref 3.5–5.1)
Sodium: 140 mmol/L (ref 135–145)
Total Bilirubin: 1 mg/dL (ref 0.3–1.2)
Total Protein: 7.1 g/dL (ref 6.5–8.1)

## 2015-10-21 LAB — URINALYSIS COMPLETE WITH MICROSCOPIC (ARMC ONLY)
BILIRUBIN URINE: NEGATIVE
Bacteria, UA: NONE SEEN
Glucose, UA: NEGATIVE mg/dL
Hgb urine dipstick: NEGATIVE
Leukocytes, UA: NEGATIVE
Nitrite: NEGATIVE
PH: 6 (ref 5.0–8.0)
PROTEIN: NEGATIVE mg/dL
Specific Gravity, Urine: 1.021 (ref 1.005–1.030)

## 2015-10-21 LAB — MAGNESIUM: Magnesium: 1.8 mg/dL (ref 1.7–2.4)

## 2015-10-21 LAB — LIPASE, BLOOD: LIPASE: 17 U/L (ref 11–51)

## 2015-10-21 LAB — POCT PREGNANCY, URINE: PREG TEST UR: NEGATIVE

## 2015-10-21 MED ORDER — SODIUM CHLORIDE 0.9 % IV BOLUS (SEPSIS)
1000.0000 mL | Freq: Once | INTRAVENOUS | Status: AC
  Administered 2015-10-21: 1000 mL via INTRAVENOUS

## 2015-10-21 MED ORDER — ONDANSETRON HCL 4 MG/2ML IJ SOLN
4.0000 mg | Freq: Once | INTRAMUSCULAR | Status: AC
  Administered 2015-10-21: 4 mg via INTRAVENOUS
  Filled 2015-10-21: qty 2

## 2015-10-21 NOTE — ED Notes (Signed)
Ems from home for syncope. Feeling sick x 2 days with vomiting and diarrhea. Under stress, feeling depressed s/p vaginal birth approx 6 months ago. Orthostatic for EMS.

## 2015-10-21 NOTE — ED Provider Notes (Addendum)
[facility] Emergency Department Provider Note  ____________________________________________  Time seen: Approximately 8:38 AM  I have reviewed the triage vital signs and the nursing notes.   HISTORY  Chief Complaint Loss of Consciousness    HPI Latoya Luna is a 29 y.o. female with history of anxiety, depression, recurrent syncopal episodes with extensive workup by primary care, cardiology and neurology, history of seizure disorder who presents for evaluation of syncopal episode which occurred suddenly just prior to arrival, now resolved. The patient reports that she has had nonbloody nonbilious emesis as well as nonbloody diarrhea since yesterday. She has been in contact with a person who has also had similar symptoms. This morning she stood up at home and felt very lightheaded and fainted, falling. She denies any head injury or pain complaints as a result of the fall. No fevers, no abdominal pain, no chest pain or difficulty breathing. She reports this has happened to her many times. On EMS arrival, blood pressure was systolic of [id number] lying down with decreased to 80 over palp with standing.Currently she has mild to moderate lightheadedness and nausea.   Past Medical History  Diagnosis Date  . Seizures ([place])     last seizure 5 years ago  . Anxiety   . Depression   . Cancer [facility])     Patient Active Problem List   Diagnosis Date Noted  . Labor and delivery, indication for care 04/09/2015  . Indication for care or intervention related to labor and delivery 04/09/2015  . Vaginal delivery 04/09/2015  . 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 Episode of syncope 11/04/2014  . History of brain disorder 09/04/2014    Past Surgical History  Procedure Laterality Date  . No past surgeries      Current Outpatient Rx  Name  Route  Sig  Dispense  Refill  . LORazepam (ATIVAN) 0.5 MG tablet   Oral   Take 0.5 mg by mouth 2 (two) times daily as needed for anxiety.           . Prenatal Vit-Fe Fumarate-FA (PRENATAL MULTIVITAMIN) TABS tablet   Oral   Take 1 tablet by mouth daily.          . sertraline (ZOLOFT) 100 MG tablet   Oral   Take 100 mg by mouth daily.         . sertraline (ZOLOFT) 25 MG tablet   Oral   Take 2 tablets (50 mg total) by mouth daily. Patient not taking: Reported on 10/21/2015   30 tablet   0     Allergies Review of patient's allergies indicates no known allergies.  History reviewed. No pertinent family history.  Social History Social History  Substance Use Topics  . Smoking status: Never Smoker   . Smokeless tobacco: None  . Alcohol Use: No    Review of Systems Constitutional: No fever/chills Eyes: No visual changes. ENT: No sore throat. Cardiovascular: Denies chest pain. Respiratory: Denies shortness of breath. Gastrointestinal: No abdominal pain.  + nausea, + vomiting.  +diarrhea.  No constipation. Genitourinary: Negative for dysuria. Musculoskeletal: Negative for back pain. Skin: Negative for rash. Neurological: Negative for headaches, focal weakness or numbness.  10-point ROS otherwise negative.  ____________________________________________   PHYSICAL EXAM:  Filed Vitals:   10/21/15 0834 10/21/15 0837  BP: 141/101 141/101  Pulse: 89 90  Temp:  98.4 F (36.9 C)  TempSrc:  Oral  Height:  5\' 7"  (1.702 m)  Weight:  148 lb (67.132 kg)  SpO2: 100%  100%      Constitutional: Alert and oriented. Well appearing and in no acute distress. Eyes: Conjunctivae are normal. PERRL. EOMI. Head: Atraumatic. Nose: No congestion/rhinnorhea. Mouth/Throat: Mucous membranes are moist.  Oropharynx non-erythematous. Neck: No stridor.  No cervical spine tenderness to palpation. Cardiovascular: Normal rate, regular rhythm. Grossly normal heart sounds.  Good peripheral circulation. Respiratory: Normal respiratory effort.  No retractions. Lungs CTAB. Gastrointestinal: Soft and nontender. No distention. No abdominal  bruits. No CVA tenderness. Genitourinary: deferred Musculoskeletal: No lower extremity tenderness nor edema.  No joint effusions. Neurologic:  Normal speech and language. No gross focal neurologic deficits are appreciated. 5 out of depression bilateral upper and lower extremities, sensation intact to light touch throughout.  Skin:  Skin is warm, dry and intact. No rash noted. Psychiatric: Mood and affect are normal. Speech and behavior are normal.  ____________________________________________   LABS (all labs ordered are listed, but only abnormal results are displayed)  Labs Reviewed  CBC WITH DIFFERENTIAL/PLATELET - Abnormal; Notable for the following:    Lymphs Abs 0.5 (*)    All other components within normal limits  COMPREHENSIVE METABOLIC PANEL - Abnormal; Notable for the following:    Potassium 3.4 (*)    Calcium 7.9 (*)    All other components within normal limits  URINALYSIS COMPLETEWITH MICROSCOPIC ([facility] ONLY) - Abnormal; Notable for the following:    Color, Urine YELLOW (*)    APPearance CLEAR (*)    Ketones, ur 1+ (*)    Squamous Epithelial / LPF 0-5 (*)    All other components within normal limits  LIPASE, BLOOD  MAGNESIUM  POC URINE PREG, ED  POCT PREGNANCY, URINE   ____________________________________________  EKG  ED ECG REPORT I, Joanne Gavel, the attending physician, personally viewed and interpreted this ECG.   Date: 10/21/2015  EKG Time: 08:36  Rate: 81  Rhythm: unchanged from previous tracings, sinus rhythm  Axis: normal  Intervals:none  ST&T Change: T-wave inversion in lead 2, 3, aVF, Q waves in aVL and V2. QTC has shortened when compared to EKG on 09/20/2015 but EKG is otherwise unchanged from prior. Still with mild QTC prolongation at 508 ms.  ____________________________________________  RADIOLOGY  CXR IMPRESSION: No active disease.  ____________________________________________   PROCEDURES  Procedure(s) performed: None  Critical  Care performed: No  ____________________________________________   INITIAL IMPRESSION / ASSESSMENT AND PLAN / ED COURSE  Pertinent labs & imaging results that were available during my care of the patient were reviewed by me and considered in my medical decision making (see chart for details).  Latoya Luna is a 29 y.o. female with history of anxiety, depression, recurrent syncopal episodes with extensive workup by primary care, cardiology and neurology, history of seizure disorder who presents for evaluation of syncopal episode which occurred suddenly just prior to arrival. On exam, she is generally nontoxic appearing and in no acute distress. Vital signs stable, she is afebrile. She has an intact neurological examination. This profound dehydration given orthostatic vital signs on EMS arrival and in the setting of likely viral illness given recurrent vomiting, diarrhea since yesterday and 6 contacts with similar symptoms. She has no abdominal tenderness and is afebrile. We'll give IV fluids, Zofran, check labs including magnesium given slightly prolonged QT C. Doubt neurogenic or cardiogenic cause of syncope given prodrome, history of similar syncopal episodes with extensive workup, intact neurological examination. Reassess for disposition.  ----------------------------------------- 11:34 AM on 10/21/2015 -----------------------------------------  Labs reviewed. CBC CMP unremarkable. Magnesium within normal limits at  1.8. Urinalysis 1+ ketones but otherwise unremarkable. Negative urine pregnancy. Chest x-ray clear. At this time the patient appears well and reports she feels much better, she is tolerating oral fluids. Blood pressure stable. She is having persistent diarrhea which I again discussed with her is likely secondary to viral illness. She has not vomited since being observed in the ER. We discussed oral hydration, meticulous return precautions and close PCP follow-up and she is comfortable  with the discharge plan. ____________________________________________   FINAL CLINICAL IMPRESSION(S) / ED DIAGNOSES  Final diagnoses:  Vasovagal syncope  Vomiting and diarrhea      Joanne Gavel, MD 10/21/15 [place] Ethin Drummond, MD 10/21/15 1136

## 2015-10-24 ENCOUNTER — Emergency Department
Admission: EM | Admit: 2015-10-24 | Discharge: 2015-10-24 | Discharge status: Against Medical Advice | Payer: Medicaid Other | Attending: Emergency Medicine | Admitting: Emergency Medicine

## 2015-10-24 DIAGNOSIS — R42 Dizziness and giddiness: Secondary | ICD-10-CM | POA: Insufficient documentation

## 2015-10-24 DIAGNOSIS — R55 Syncope and collapse: Secondary | ICD-10-CM | POA: Insufficient documentation

## 2015-10-24 NOTE — ED Provider Notes (Signed)
Patient noted to have eloped from the ED treatment room prior to my assessment.  Carrie Mew, MD 10/24/15 [phone]

## 2015-10-24 NOTE — ED Notes (Signed)
Pt arrived via [facility], reports she was at work when she felt dizzy and fainted. States she had the stomach bug last week and has not eaten/drink much. States she is breastfeeding at this time.

## 2015-10-24 NOTE — ED Notes (Signed)
This RN walked into room and pt stated she had to leave and go home to her kids,she had taken IV out herself. I informed her she would be leaving AMA and she said she had to go and left without signing or receiving discharge papers

## 2016-02-01 IMAGING — CT CT CERVICAL SPINE W/O CM
2 of 5 series · 4 of 14 positions shown, 5 images · non-contrast
Comparison: Head CT 02/27/2014 and cervical spine CT 08/27/2007

CLINICAL DATA: Syncopal episode today. Hit head on concrete.
History of seizures. Initial encounter.

EXAM:
CT HEAD WITHOUT CONTRAST
CT CERVICAL SPINE WITHOUT CONTRAST
TECHNIQUE: Multidetector CT imaging of the head and cervical spine was
performed following the standard protocol without intravenous
contrast. Multiplanar CT image reconstructions of the cervical spine
were also generated.

[Series 3: bone · axial · 0.47mm/px · z∈[-79,-19]mm · 2 of 92 slices shown, 3 images]
[im 31/92  soft-tissue]
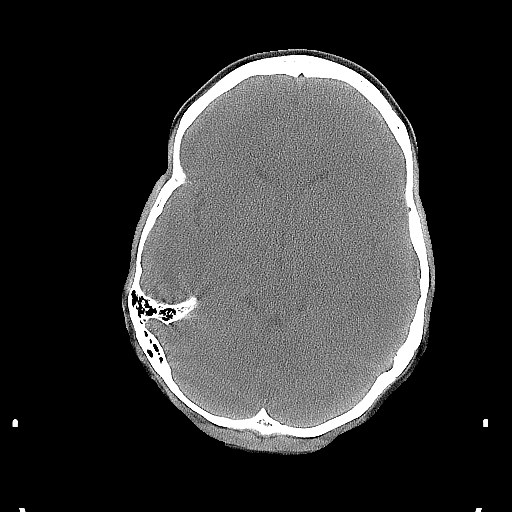
[im 31/92  bone]
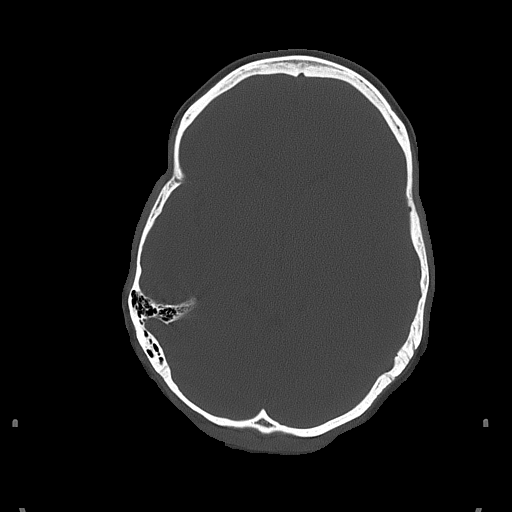
[im 61/92  bone]
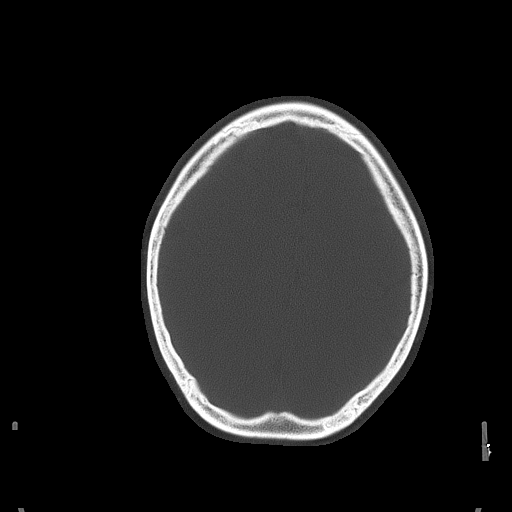

[Series 12: orthogonal axials · axial · 0.22mm/px · z∈[-239,-183]mm · 2 of 93 slices shown]
[im 31/93  bone]
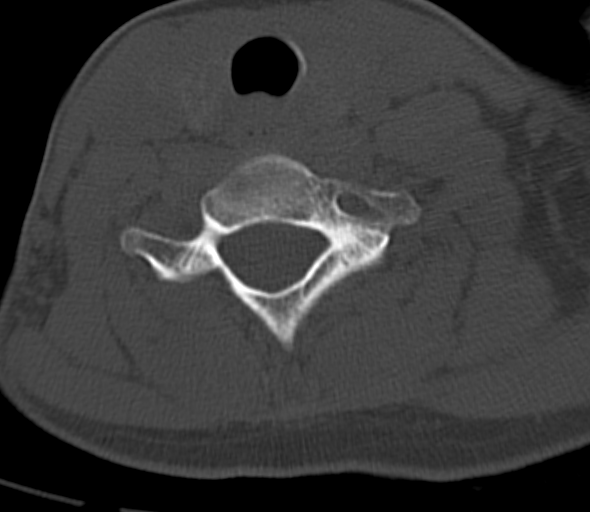
[im 62/93  bone]
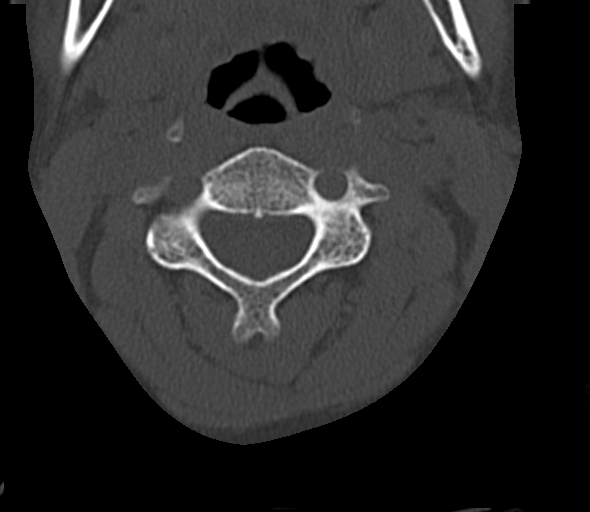

[4 of 14 positions shown; findings below may reference images not displayed]

FINDINGS: CT HEAD FINDINGS

The ventricles and sulci are within normal limits for age. There is
no evidence of acute infarct, intracranial hemorrhage, mass, midline
shift, or extra-axial collection.

The orbits are unremarkable. The visualized paranasal sinuses and
mastoid air cells are clear. No skull fracture is identified.

CT CERVICAL SPINE FINDINGS

Straightening of the cervical lordosis may be positional. There is
no listhesis. No cervical spine fracture is identified. Vertebral
body heights and intervertebral disc space heights are preserved.
Mild left facet arthrosis is noted at C7-T1, similar to the prior
study. Paraspinal soft tissues are unremarkable.
IMPRESSION: 1. Unremarkable head CT.
2. No acute osseous abnormality identified in the cervical spine.

## 2016-03-03 IMAGING — CR DG CHEST 1V PORT
1 series · 1 of 1 positions shown · non-contrast
Comparison: 11/02/2013

CLINICAL DATA: Syncope. Feeling sick for 2 days. Vomiting and
diarrhea.

EXAM:
PORTABLE CHEST 1 VIEW

[ap]
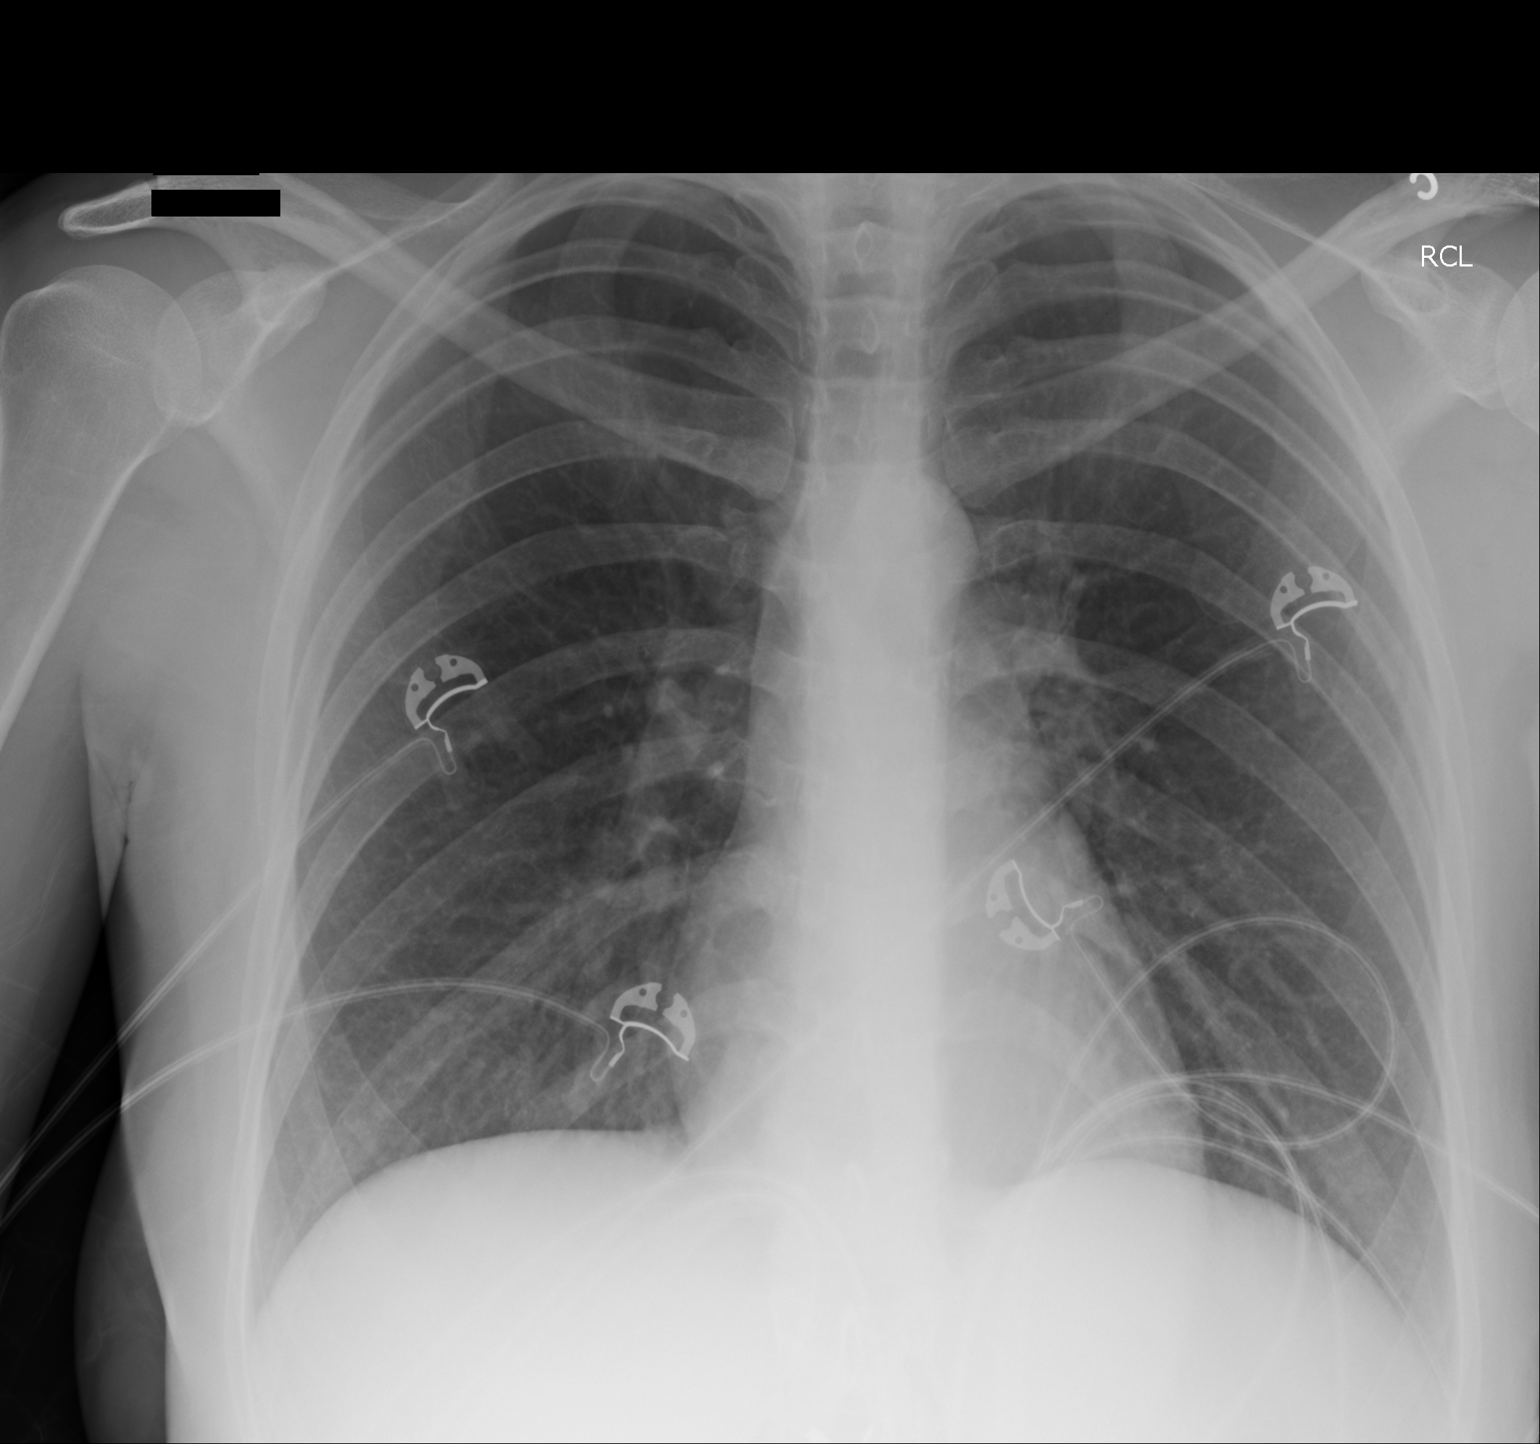

[1 of 1 positions shown; findings below may reference images not displayed]

FINDINGS: The heart size and mediastinal contours are within normal limits.
Both lungs are clear. The visualized skeletal structures are
unremarkable.
IMPRESSION: No active disease.

## 2016-03-07 ENCOUNTER — Emergency Department: Payer: Medicaid Other

## 2016-03-07 ENCOUNTER — Emergency Department
Admission: EM | Admit: 2016-03-07 | Discharge: 2016-03-08 | Disposition: A | Discharge status: Home / Self Care | Payer: Medicaid Other | Attending: Emergency Medicine | Admitting: Emergency Medicine

## 2016-03-07 DIAGNOSIS — S4992XA Unspecified injury of left shoulder and upper arm, initial encounter: Secondary | ICD-10-CM | POA: Diagnosis present

## 2016-03-07 DIAGNOSIS — M25512 Pain in left shoulder: Secondary | ICD-10-CM

## 2016-03-07 DIAGNOSIS — Y939 Activity, unspecified: Secondary | ICD-10-CM | POA: Diagnosis not present

## 2016-03-07 DIAGNOSIS — R569 Unspecified convulsions: Secondary | ICD-10-CM | POA: Insufficient documentation

## 2016-03-07 DIAGNOSIS — F418 Other specified anxiety disorders: Secondary | ICD-10-CM | POA: Insufficient documentation

## 2016-03-07 DIAGNOSIS — R4020 Unspecified coma: Secondary | ICD-10-CM | POA: Diagnosis not present

## 2016-03-07 DIAGNOSIS — Y929 Unspecified place or not applicable: Secondary | ICD-10-CM | POA: Insufficient documentation

## 2016-03-07 DIAGNOSIS — W108XXA Fall (on) (from) other stairs and steps, initial encounter: Secondary | ICD-10-CM | POA: Insufficient documentation

## 2016-03-07 DIAGNOSIS — Y999 Unspecified external cause status: Secondary | ICD-10-CM | POA: Diagnosis not present

## 2016-03-07 DIAGNOSIS — Z859 Personal history of malignant neoplasm, unspecified: Secondary | ICD-10-CM | POA: Diagnosis not present

## 2016-03-07 NOTE — ED Notes (Signed)
Pt transported to xray 

## 2016-03-07 NOTE — ED Notes (Addendum)
Pt arrives to ER via [facility] from home. Pt fell down approx 3-4 stairs at home after "missing" first stair. Pt c/o left shoulder pain at time of arrival. Pt arrives in hard c collar placed by EMS-which remains on. 20G to R AC placed by EMS and 30mcg of Fentanyl given en route. Pt unsure if she lost consciousness or not. Pt alert and oriented X4, active, cooperative, pt in NAD. RR even and unlabored, color WNL.  Pain with movement of left arm. CMS of left hand intact.

## 2016-03-08 ENCOUNTER — Emergency Department: Payer: Medicaid Other

## 2016-03-08 MED ORDER — ONDANSETRON HCL 4 MG/2ML IJ SOLN
4.0000 mg | Freq: Once | INTRAMUSCULAR | Status: AC
Start: 2016-03-08 — End: 2016-03-08
  Administered 2016-03-08: 4 mg via INTRAVENOUS
  Filled 2016-03-08: qty 2

## 2016-03-08 MED ORDER — MORPHINE SULFATE (PF) 4 MG/ML IV SOLN
4.0000 mg | Freq: Once | INTRAVENOUS | Status: AC
  Administered 2016-03-08: 4 mg via INTRAVENOUS
  Filled 2016-03-08: qty 1

## 2016-03-08 MED ORDER — OXYCODONE-ACETAMINOPHEN 5-325 MG PO TABS: 1.0000 | ORAL_TABLET | ORAL | Status: DC | PRN

## 2016-03-08 NOTE — ED Notes (Signed)
Vital signs stable. 

## 2016-03-08 NOTE — Discharge Instructions (Signed)

## 2016-03-08 NOTE — ED Provider Notes (Signed)
[facility] Emergency Department Provider Note  ____________________________________________  Time seen: 11:50 PM  I have reviewed the triage vital signs and the nursing notes.   HISTORY  Chief Complaint Fall and Shoulder Injury      HPI Latoya Luna is a 30 y.o. female presents with history of falling down approximately 8 steps before presentation to the emergency department. Patient admits to left shoulder pain that is worse with movement current pain score 7 out of 10. In addition patient admits to striking her head down the steps at resultant loss of consciousness that she states may have been for approximately 15 minutes.    Past Medical History  Diagnosis Date  . Seizures ([place])     last seizure 5 years ago  . Anxiety   . Depression   . Cancer [facility])     Patient Active Problem List   Diagnosis Date Noted  . Labor and delivery, indication for care 04/09/2015  . Indication for care or intervention related to labor and delivery 04/09/2015  . Vaginal delivery 04/09/2015  . 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 Episode of syncope 11/04/2014  . History of brain disorder 09/04/2014    Past Surgical History  Procedure Laterality Date  . No past surgeries      Current Outpatient Rx  Name  Route  Sig  Dispense  Refill  . LORazepam (ATIVAN) 0.5 MG tablet   Oral   Take 0.5 mg by mouth 2 (two) times daily as needed for anxiety.          . Prenatal Vit-Fe Fumarate-FA (PRENATAL MULTIVITAMIN) TABS tablet   Oral   Take 1 tablet by mouth daily.          . sertraline (ZOLOFT) 100 MG tablet   Oral   Take 100 mg by mouth daily.         . sertraline (ZOLOFT) 25 MG tablet   Oral   Take 2 tablets (50 mg total) by mouth daily. Patient not taking: Reported on 10/21/2015   30 tablet   0     Allergies No known drug allergies No family history on file.  Social History Social History  Substance Use Topics  . Smoking status: Never Smoker   .  Smokeless tobacco: None  . Alcohol Use: No    Review of Systems  Constitutional: Negative for fever. Eyes: Negative for visual changes. ENT: Negative for sore throat. Cardiovascular: Negative for chest pain. Respiratory: Negative for shortness of breath. Gastrointestinal: Negative for abdominal pain, vomiting and diarrhea. Genitourinary: Negative for dysuria. Musculoskeletal: Negative for back pain.Positive left shoulder pain Skin: Negative for rash. Neurological: Positive for headache,  loss of consciousness, head injury   10-point ROS otherwise negative.  ____________________________________________   PHYSICAL EXAM:  VITAL SIGNS: ED Triage Vitals  Enc Vitals Group     BP 03/07/16 2202 115/86 mmHg     Pulse Rate 03/07/16 2202 126     Resp 03/07/16 2205 20     Temp 03/07/16 2205 97.8 F (36.6 C)     Temp Source 03/07/16 2205 Oral     SpO2 03/07/16 2202 100 %     Weight 03/07/16 2205 144 lb (65.318 kg)     Height 03/07/16 2205 5\' 7"  (1.702 m)     Head Cir --      Peak Flow --      Pain Score 03/07/16 2205 7     Pain Loc --      Pain Edu? --  Excl. in [place]? --      Constitutional: Alert and oriented. Apparent discomfort Eyes: Conjunctivae are normal. PERRL. Normal extraocular movements. ENT   Head: Normocephalic and atraumatic.   Nose: No congestion/rhinnorhea.   Mouth/Throat: Mucous membranes are moist.   Neck: No stridor. Hematological/Lymphatic/Immunilogical: No cervical lymphadenopathy. Cardiovascular: Normal rate, regular rhythm. Normal and symmetric distal pulses are present in all extremities. No murmurs, rubs, or gallops. Respiratory: Normal respiratory effort without tachypnea nor retractions. Breath sounds are clear and equal bilaterally. No wheezes/rales/rhonchi. Gastrointestinal: Soft and nontender. No distention. There is no CVA tenderness. Genitourinary: deferred Musculoskeletal:Left shoulder pain with palpation active/passive  ROM Neurologic:  Normal speech and language. No gross focal neurologic deficits are appreciated. Speech is normal.  Skin:  Skin is warm, dry and intact. No rash noted. Psychiatric: Mood and affect are normal. Speech and behavior are normal. Patient exhibits appropriate insight and judgment.    RADIOLOGY  DG Cervical Spine Complete (Final result) Result time: 03/07/16 23:55:18   Final result by Rad Results In Interface (03/07/16 23:55:18)   Narrative:   CLINICAL DATA: Pain following fall down steps  EXAM: CERVICAL SPINE - COMPLETE 4+ VIEW  COMPARISON: Cervical spine CT September 20, 2015  FINDINGS: Frontal, lateral, open-mouth odontoid, and bilateral oblique views were obtained with the neck in collar. There is no demonstrable fracture or spondylolisthesis. Prevertebral soft tissues and predental space regions are normal. The disc spaces appear normal. There is no appreciable exit foraminal narrowing on the oblique views.  IMPRESSION: No demonstrable fracture or spondylolisthesis. No appreciable arthropathy. Note that no assessment for potential ligamentous injury can be made with in collar only images.   Electronically Signed By: Lowella Grip III M.D. On: 03/07/2016 23:55          DG Shoulder Left (Final result) Result time: 03/07/16 23:56:36   Final result by Rad Results In Interface (03/07/16 23:56:36)   Narrative:   CLINICAL DATA: Pain following fall down steps  EXAM: LEFT SHOULDER - 2+ VIEW  COMPARISON: None.  FINDINGS: Frontal, shallow oblique, and Y scapular images were obtained. There is no demonstrable fracture or dislocation. Joint spaces appear normal. No erosive change. Visualized left lung clear.  IMPRESSION: No demonstrable fracture or dislocation. No appreciable arthropathic change.   Electronically Signed By: Lowella Grip III M.D. On: 03/07/2016 23:56            INITIAL IMPRESSION / ASSESSMENT AND PLAN  / ED COURSE  Pertinent labs & imaging results that were available during my care of the patient were reviewed by me and considered in my medical decision making (see chart for details).    ____________________________________________   FINAL CLINICAL IMPRESSION(S) / ED DIAGNOSES  Final diagnoses:  Left shoulder pain      Gregor Hams, MD 03/08/16 [phone]

## 2016-03-08 NOTE — ED Notes (Signed)
Pt states her ride was supposed to have called about an hour ago to find out when she would need picked up following d/c, but has not called yet. Pt states she does not have her cell phone and does not know anyone's phone number from memory.

## 2016-05-07 ENCOUNTER — Encounter: Payer: Self-pay | Admitting: Emergency Medicine

## 2016-05-07 ENCOUNTER — Emergency Department
Admission: EM | Admit: 2016-05-07 | Discharge: 2016-05-07 | Disposition: A | Discharge status: Home / Self Care | Payer: Medicaid Other | Attending: Emergency Medicine | Admitting: Emergency Medicine

## 2016-05-07 DIAGNOSIS — R55 Syncope and collapse: Secondary | ICD-10-CM | POA: Insufficient documentation

## 2016-05-07 DIAGNOSIS — Z8669 Personal history of other diseases of the nervous system and sense organs: Secondary | ICD-10-CM | POA: Diagnosis not present

## 2016-05-07 DIAGNOSIS — Z79899 Other long term (current) drug therapy: Secondary | ICD-10-CM | POA: Diagnosis not present

## 2016-05-07 DIAGNOSIS — F329 Major depressive disorder, single episode, unspecified: Secondary | ICD-10-CM | POA: Insufficient documentation

## 2016-05-07 DIAGNOSIS — Z859 Personal history of malignant neoplasm, unspecified: Secondary | ICD-10-CM | POA: Diagnosis not present

## 2016-05-07 LAB — TROPONIN I

## 2016-05-07 LAB — COMPREHENSIVE METABOLIC PANEL
ALBUMIN: 3.7 g/dL (ref 3.5–5.0)
ALK PHOS: 52 U/L (ref 38–126)
ALT: 17 U/L (ref 14–54)
ANION GAP: 8 (ref 5–15)
AST: 18 U/L (ref 15–41)
BILIRUBIN TOTAL: 0.9 mg/dL (ref 0.3–1.2)
BUN: 19 mg/dL (ref 6–20)
CHLORIDE: 110 mmol/L (ref 101–111)
CO2: 23 mmol/L (ref 22–32)
CREATININE: 0.56 mg/dL (ref 0.44–1.00)
Calcium: 7.8 mg/dL — ABNORMAL LOW (ref 8.9–10.3)
GFR calc non Af Amer: >60 mL/min (ref >60)
Glucose, Bld: 86 mg/dL (ref 65–99)
POTASSIUM: 3.3 mmol/L — AB (ref 3.5–5.1)
Sodium: 141 mmol/L (ref 135–145)
Total Protein: 6.2 g/dL — ABNORMAL LOW (ref 6.5–8.1)

## 2016-05-07 LAB — CBC
HEMATOCRIT: 36.1 % (ref 35.0–47.0)
HEMOGLOBIN: 12.3 g/dL (ref 12.0–16.0)
MCH: 30.2 pg (ref 26.0–34.0)
MCHC: 34 g/dL (ref 32.0–36.0)
MCV: 88.7 fL (ref 80.0–100.0)
Platelets: 183 10*3/uL (ref 150–440)
RBC: 4.07 MIL/uL (ref 3.80–5.20)
RDW: 13.3 % (ref 11.5–14.5)
WBC: 3.9 10*3/uL (ref 3.6–11.0)

## 2016-05-07 LAB — POCT PREGNANCY, URINE: PREG TEST UR: NEGATIVE

## 2016-05-07 MED ORDER — SODIUM CHLORIDE 0.9 % IV SOLN
1000.0000 mL | Freq: Once | INTRAVENOUS | Status: AC
  Administered 2016-05-07: 1000 mL via INTRAVENOUS

## 2016-05-07 NOTE — ED Notes (Addendum)
Pt to ED via EMS c/o syncopal episode after being out on golf [place] all day long. Pt unsure how long she was unconscious. EMS reports BP lying - 90s/60s, HR 80s; BP sitting - 70s/50s, HR 110s. Pt reports lightheadedness, improving with IVF. Pt given 1382mL PTA by EMS. EMS CBG 91, EKG showed NSR. C/o headache. Family reports pt ws found lying on concrete, unsure if head involvement. Hx seizures.

## 2016-05-07 NOTE — ED Provider Notes (Signed)
[facility] Emergency Department Provider Note  ____________________________________________    I have reviewed the triage vital signs and the nursing notes.   HISTORY  Chief Complaint Loss of Consciousness    HPI Latoya Luna is a 30 y.o. female who presents after a syncopal episode. She reports she has been working on a golf course all day long in the heat. She felt very hot and then apparently syncopized. Patient has a long history of syncope in the past. She also has a history of seizures. She denies chest pain or palpitations. She reports feeling significantly better now after getting IV fluids. She denies significant headache injury. She has had extensive workups in the past for similar episodes     Past Medical History  Diagnosis Date  . Seizures ([place])     last seizure 5 years ago  . Anxiety   . Depression   . Cancer [facility])     Patient Active Problem List   Diagnosis Date Noted  . Labor and delivery, indication for care 04/09/2015  . Indication for care or intervention related to labor and delivery 04/09/2015  . Vaginal delivery 04/09/2015  . 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 Episode of syncope 11/04/2014  . History of brain disorder 09/04/2014    Past Surgical History  Procedure Laterality Date  . No past surgeries      Current Outpatient Rx  Name  Route  Sig  Dispense  Refill  . LORazepam (ATIVAN) 0.5 MG tablet   Oral   Take 0.5 mg by mouth 2 (two) times daily as needed for anxiety.          Marland Kitchen oxyCODONE-acetaminophen (ROXICET) 5-325 MG tablet   Oral   Take 1 tablet by mouth every 4 (four) hours as needed for severe pain.   20 tablet   0   . Prenatal Vit-Fe Fumarate-FA (PRENATAL MULTIVITAMIN) TABS tablet   Oral   Take 1 tablet by mouth daily.          . sertraline (ZOLOFT) 100 MG tablet   Oral   Take 100 mg by mouth daily.         . sertraline (ZOLOFT) 25 MG tablet   Oral   Take 2 tablets (50 mg total) by mouth  daily. Patient not taking: Reported on 10/21/2015   30 tablet   0     Allergies Review of patient's allergies indicates no known allergies.  History reviewed. No pertinent family history.  Social History Social History  Substance Use Topics  . Smoking status: Never Smoker   . Smokeless tobacco: None  . Alcohol Use: No    Review of Systems  Constitutional: Negative for fever. Eyes: Negative for redness ENT: Negative for sore throat Cardiovascular: Negative for chest pain Respiratory: Negative for shortness of breath. Gastrointestinal: Negative for abdominal pain Genitourinary: Negative for dysuria. Musculoskeletal: Negative for back pain. Skin: Negative for rash. Neurological: Negative for focal weakness Psychiatric: no anxiety    ____________________________________________   PHYSICAL EXAM:  VITAL SIGNS: ED Triage Vitals  Enc Vitals Group     BP --      Pulse --      Resp --      Temp --      Temp src --      SpO2 --      Weight --      Height --      Head Cir --      Peak Flow --  Pain Score 05/07/16 1702 7     Pain Loc --      Pain Edu? --      Excl. in [place]? --      Constitutional: Alert and oriented. Well appearing and in no distress.  Eyes: Conjunctivae are normal. No erythema or injection ENT   Head: Normocephalic and atraumatic.   Mouth/Throat: Mucous membranes are moist. Cardiovascular: Normal rate, regular rhythm. Normal and symmetric distal pulses are present in the upper extremities. No murmurs or rubs  Respiratory: Normal respiratory effort without tachypnea nor retractions. Breath sounds are clear and equal bilaterally.  Gastrointestinal: Soft and non-tender in all quadrants. No distention. There is no CVA tenderness. Genitourinary: deferred Musculoskeletal: Nontender with normal range of motion in all extremities. No lower extremity tenderness nor edema. Neurologic:  Normal speech and language. No gross focal neurologic  deficits are appreciated. Skin:  Skin is warm, dry and intact. No rash noted. Psychiatric: Mood and affect are normal. Patient exhibits appropriate insight and judgment.  ____________________________________________    LABS (pertinent positives/negatives)  Labs Reviewed - No data to display  ____________________________________________   EKG  ED ECG REPORT I, Lavonia Drafts, the attending physician, personally viewed and interpreted this ECG.  Date: 05/07/2016 EKG Time: 5:12 PM  Rhythm: normal sinus rhythm QRS Axis: normal Intervals: normal ST/T Wave abnormalities: normal Conduction Disturbances: none Narrative Interpretation: unremarkable   ____________________________________________    RADIOLOGY  None  ____________________________________________   PROCEDURES  Procedure(s) performed: none  Critical Care performed: none  ____________________________________________   INITIAL IMPRESSION / ASSESSMENT AND PLAN / ED COURSE  Pertinent labs & imaging results that were available during my care of the patient were reviewed by me and considered in my medical decision making (see chart for details).  Patient overall well-appearing and in no distress. Extensive workups for syncope in the past. No post ictal period to suggest seizure. Suspect heat related syncope. We will give IV fluids, check labs, orthostatics and reevaluate.  ----------------------------------------- 6:53 PM on 05/07/2016 -----------------------------------------  Patient feels well, asymptomatic. Orthostatics normal. EKG unremarkable. Feel she is appropriate for discharge at this time with follow-up with her PCP. Return precautions discussed  ____________________________________________   FINAL CLINICAL IMPRESSION(S) / ED DIAGNOSES  Final diagnoses:  Syncope and collapse          Lavonia Drafts, MD 05/07/16 [phone]

## 2016-05-07 NOTE — Discharge Instructions (Signed)

## 2016-07-31 ENCOUNTER — Emergency Department
Admission: EM | Admit: 2016-07-31 | Discharge: 2016-07-31 | Disposition: A | Discharge status: Home / Self Care | Payer: Medicaid Other | Attending: Emergency Medicine | Admitting: Emergency Medicine

## 2016-07-31 DIAGNOSIS — F419 Anxiety disorder, unspecified: Secondary | ICD-10-CM | POA: Diagnosis not present

## 2016-07-31 DIAGNOSIS — F329 Major depressive disorder, single episode, unspecified: Secondary | ICD-10-CM | POA: Diagnosis not present

## 2016-07-31 DIAGNOSIS — Z859 Personal history of malignant neoplasm, unspecified: Secondary | ICD-10-CM | POA: Diagnosis not present

## 2016-07-31 DIAGNOSIS — B9689 Other specified bacterial agents as the cause of diseases classified elsewhere: Secondary | ICD-10-CM

## 2016-07-31 DIAGNOSIS — N760 Acute vaginitis: Secondary | ICD-10-CM | POA: Insufficient documentation

## 2016-07-31 DIAGNOSIS — J029 Acute pharyngitis, unspecified: Secondary | ICD-10-CM | POA: Insufficient documentation

## 2016-07-31 LAB — URINALYSIS COMPLETE WITH MICROSCOPIC (ARMC ONLY)
BACTERIA UA: NONE SEEN
BILIRUBIN URINE: NEGATIVE
GLUCOSE, UA: NEGATIVE mg/dL
Hgb urine dipstick: NEGATIVE
Ketones, ur: NEGATIVE mg/dL
Leukocytes, UA: NEGATIVE
Nitrite: NEGATIVE
Protein, ur: NEGATIVE mg/dL
Specific Gravity, Urine: 1.009 (ref 1.005–1.030)
pH: 7 (ref 5.0–8.0)

## 2016-07-31 LAB — CHLAMYDIA/NGC RT PCR (ARMC ONLY)
CHLAMYDIA TR: NOT DETECTED
N GONORRHOEAE: NOT DETECTED

## 2016-07-31 LAB — WET PREP, GENITAL
CLUE CELLS WET PREP: NONE SEEN
Sperm: NONE SEEN
TRICH WET PREP: NONE SEEN
Yeast Wet Prep HPF POC: NONE SEEN

## 2016-07-31 LAB — POCT PREGNANCY, URINE: Preg Test, Ur: NEGATIVE

## 2016-07-31 MED ORDER — METRONIDAZOLE 500 MG PO TABS
500.0000 mg | ORAL_TABLET | Freq: Two times a day (BID) | ORAL | 0 refills | Status: AC
Start: 2016-07-31 — End: 2016-08-10

## 2016-07-31 MED ORDER — METRONIDAZOLE 500 MG PO TABS
500.0000 mg | ORAL_TABLET | Freq: Once | ORAL | Status: AC
  Administered 2016-07-31: 500 mg via ORAL
  Filled 2016-07-31: qty 1

## 2016-07-31 NOTE — ED Triage Notes (Signed)
Pt states that a child they had been exposed to had strep, she and her daughter both have sore throats and her daughter has been running a fever

## 2016-07-31 NOTE — ED Notes (Signed)
Pt states that her throat has been sore for a few days, friend's child was diagnosed with strep

## 2016-07-31 NOTE — ED Provider Notes (Signed)
[facility] Emergency Department Provider Note   ____________________________________________   None    (approximate)  I have reviewed the triage vital signs and the nursing notes.   HISTORY  Chief Complaint Sore Throat    HPI Latoya Luna is a 30 y.o. female patient complaining of sore throat status post being exposed to strep. Patient states daughter also has complained of sore throat.Family has another child is staying with them that was diagnosed positive for strep throat 2 days ago. Patient is being treated. Patient denies any fever or URI signs symptoms associated with a sore throat. Patient rates her pain discomfort as a 4/10. Patient described her pain as "achy".   Past Medical History:  Diagnosis Date  . Anxiety   . Cancer ([place])   . Depression   . Seizures ([place])    last seizure 5 years ago    Patient Active Problem List   Diagnosis Date Noted  . Labor and delivery, indication for care 04/09/2015  . Indication for care or intervention related to labor and delivery 04/09/2015  . Vaginal delivery 04/09/2015  . 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 Episode of syncope 11/04/2014  . 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 NO PAST SURGERIES      Prior to Admission medications   Medication Sig Start Date End Date Taking? Authorizing Provider  LORazepam (ATIVAN) 0.5 MG tablet Take 0.5 mg by mouth 2 (two) times daily as needed for anxiety.     Historical Provider, MD  oxyCODONE-acetaminophen (ROXICET) 5-325 MG tablet Take 1 tablet by mouth every 4 (four) hours as needed for severe pain. 03/08/16   Gregor Hams, MD  Prenatal Vit-Fe Fumarate-FA (PRENATAL MULTIVITAMIN) TABS tablet Take 1 tablet by mouth daily.     Historical Provider, MD  sertraline (ZOLOFT) 100 MG tablet Take 100 mg by mouth daily.    Historical Provider, MD  sertraline (ZOLOFT) 25 MG tablet Take 2 tablets (50 mg total) by  mouth daily. Patient not taking: Reported on 10/21/2015 09/04/15   Gonzella Lex, MD    Allergies Review of patient's allergies indicates no known allergies.  No family history on file.  Social History Social History  Substance Use Topics  . Smoking status: Never Smoker  . Smokeless tobacco: Not on file  . Alcohol use No    Review of Systems Constitutional: No fever/chills Eyes: No visual changes. [id number] throat Cardiovascular: Denies chest pain. Respiratory: Denies shortness of breath. Gastrointestinal: No abdominal pain.  No nausea, no vomiting.  No diarrhea.  No constipation. Genitourinary: Negative for dysuria. Musculoskeletal: Negative for back pain. Skin: Negative for rash. Neurological: Negative for headaches, focal weakness or numbness. Psychiatric:Anxiety and depression  ____________________________________________   PHYSICAL EXAM:  VITAL SIGNS: ED Triage Vitals  Enc Vitals Group     BP      Pulse      Resp      Temp      Temp src      SpO2      Weight      Height      Head Circumference      Peak Flow      Pain Score      Pain Loc      Pain Edu?      Excl. in [place]?     Constitutional: Alert and oriented. Well appearing and in no acute distress. Eyes: Conjunctivae are normal. PERRL.  EOMI. Head: Atraumatic. Nose: No congestion/rhinnorhea. Mouth/Throat: Mucous membranes are moist.  Oropharynx non-erythematous. Neck: No stridor.  No cervical spine tenderness to palpation. Hematological/Lymphatic/Immunilogical: No cervical lymphadenopathy. Cardiovascular: Normal rate, regular rhythm. Grossly normal heart sounds.  Good peripheral circulation. Respiratory: Normal respiratory effort.  No retractions. Lungs CTAB. Gastrointestinal: Soft and nontender. No distention. No abdominal bruits. No CVA tenderness. Musculoskeletal: No lower extremity tenderness nor edema.  No joint effusions. Neurologic:  Normal speech and language. No gross focal neurologic  deficits are appreciated. No gait instability. Skin:  Skin is warm, dry and intact. No rash noted. Psychiatric: Mood and affect are normal. Speech and behavior are normal.  ____________________________________________   LABS (all labs ordered are listed, but only abnormal results are displayed)  Labs Reviewed  POCT PREGNANCY, URINE   ____________________________________________  EKG   ____________________________________________  RADIOLOGY   ____________________________________________   PROCEDURES  Procedure(s) performed: None  Procedures  Critical Care performed: No  ____________________________________________   INITIAL IMPRESSION / ASSESSMENT AND PLAN / ED COURSE  Pertinent labs & imaging results that were available during my care of the patient were reviewed by me and considered in my medical decision making (see chart for details).  Pharyngitis. Discussed negative rapid strep test results with patient. Advise culture is pending. Patient given discharge Instructions. Follow-up with family doctor condition persists.  Clinical Course     ____________________________________________   FINAL CLINICAL IMPRESSION(S) / ED DIAGNOSES  Final diagnoses:  Viral pharyngitis      NEW MEDICATIONS STARTED DURING THIS VISIT:  New Prescriptions   No medications on file     Note:  This document was prepared using Dragon voice recognition software and may include unintentional dictation errors.    Sable Feil, PA-C 07/31/16 1946    Delman Kitten, MD 07/31/16 [phone]

## 2016-07-31 NOTE — Discharge Instructions (Addendum)
Follow supportive care and be advised to strep culture is pending.

## 2016-07-31 NOTE — ED Notes (Signed)
Pt requesting pelivc exam.  Sts that she also checked into ED to be seen for "woman's issues".  Informed Ron PA.

## 2016-08-01 ENCOUNTER — Encounter: Payer: Self-pay | Admitting: Emergency Medicine

## 2016-08-01 ENCOUNTER — Emergency Department
Admission: EM | Admit: 2016-08-01 | Discharge: 2016-08-01 | Disposition: A | Discharge status: Home / Self Care | Payer: Medicaid Other | Attending: Emergency Medicine | Admitting: Emergency Medicine

## 2016-08-01 DIAGNOSIS — Z791 Long term (current) use of non-steroidal anti-inflammatories (NSAID): Secondary | ICD-10-CM | POA: Diagnosis not present

## 2016-08-01 DIAGNOSIS — R42 Dizziness and giddiness: Secondary | ICD-10-CM | POA: Diagnosis present

## 2016-08-01 DIAGNOSIS — E860 Dehydration: Secondary | ICD-10-CM | POA: Insufficient documentation

## 2016-08-01 DIAGNOSIS — Z79899 Other long term (current) drug therapy: Secondary | ICD-10-CM | POA: Diagnosis not present

## 2016-08-01 DIAGNOSIS — B001 Herpesviral vesicular dermatitis: Secondary | ICD-10-CM | POA: Insufficient documentation

## 2016-08-01 DIAGNOSIS — Z859 Personal history of malignant neoplasm, unspecified: Secondary | ICD-10-CM | POA: Insufficient documentation

## 2016-08-01 DIAGNOSIS — R55 Syncope and collapse: Secondary | ICD-10-CM | POA: Insufficient documentation

## 2016-08-01 LAB — CBC WITH DIFFERENTIAL/PLATELET
Basophils Absolute: 0 10*3/uL (ref 0–0.1)
Basophils Relative: 1 %
EOS ABS: 0 10*3/uL (ref 0–0.7)
EOS PCT: 1 %
HCT: 41.4 % (ref 35.0–47.0)
HEMOGLOBIN: 14.5 g/dL (ref 12.0–16.0)
LYMPHS ABS: 1.1 10*3/uL (ref 1.0–3.6)
Lymphocytes Relative: 24 %
MCH: 31.5 pg (ref 26.0–34.0)
MCHC: 34.9 g/dL (ref 32.0–36.0)
MCV: 90.2 fL (ref 80.0–100.0)
MONOS PCT: 8 %
Monocytes Absolute: 0.3 10*3/uL (ref 0.2–0.9)
NEUTROS PCT: 66 %
Neutro Abs: 2.9 10*3/uL (ref 1.4–6.5)
Platelets: 199 10*3/uL (ref 150–440)
RBC: 4.59 MIL/uL (ref 3.80–5.20)
RDW: 13.6 % (ref 11.5–14.5)
WBC: 4.4 10*3/uL (ref 3.6–11.0)

## 2016-08-01 LAB — COMPREHENSIVE METABOLIC PANEL
ALK PHOS: 54 U/L (ref 38–126)
ALT: 17 U/L (ref 14–54)
ANION GAP: 6 (ref 5–15)
AST: 19 U/L (ref 15–41)
Albumin: 4.5 g/dL (ref 3.5–5.0)
BUN: 10 mg/dL (ref 6–20)
CALCIUM: 8.8 mg/dL — AB (ref 8.9–10.3)
CO2: 26 mmol/L (ref 22–32)
CREATININE: 0.58 mg/dL (ref 0.44–1.00)
Chloride: 107 mmol/L (ref 101–111)
Glucose, Bld: 87 mg/dL (ref 65–99)
Potassium: 3.9 mmol/L (ref 3.5–5.1)
SODIUM: 139 mmol/L (ref 135–145)
TOTAL PROTEIN: 7.4 g/dL (ref 6.5–8.1)
Total Bilirubin: 0.7 mg/dL (ref 0.3–1.2)

## 2016-08-01 LAB — MAGNESIUM: MAGNESIUM: 2.1 mg/dL (ref 1.7–2.4)

## 2016-08-01 LAB — TROPONIN I: Troponin I: <0.03 ng/mL (ref <0.03)

## 2016-08-01 MED ORDER — IBUPROFEN 800 MG PO TABS
800.0000 mg | ORAL_TABLET | ORAL | Status: AC
  Administered 2016-08-01: 800 mg via ORAL
  Filled 2016-08-01: qty 1

## 2016-08-01 MED ORDER — SODIUM CHLORIDE 0.9 % IV BOLUS (SEPSIS)
1000.0000 mL | Freq: Once | INTRAVENOUS | Status: AC
  Administered 2016-08-01: 1000 mL via INTRAVENOUS

## 2016-08-01 MED ORDER — VALACYCLOVIR HCL 1 G PO TABS: 1000.0000 mg | ORAL_TABLET | Freq: Two times a day (BID) | ORAL | 0 refills | Status: AC

## 2016-08-01 NOTE — Discharge Instructions (Signed)
No breastfeeding your child within 24 hours of taking Valtrex please.  You have been seen today in the Emergency Department (ED)  for syncope (passing out).  Your workup including labs and EKG show reassuring results.  Your symptoms may be due to dehydration, so it is important that you drink plenty of non-alcoholic fluids.  Please call your regular doctor as soon as possible to schedule the next available clinic appointment to follow up with him/her regarding your visit to the ED and your symptoms.  Return to the Emergency Department (ED)  if you have any further syncopal episodes (pass out again) or develop ANY chest pain, pressure, tightness, trouble breathing, sudden sweating, or other symptoms that concern you.

## 2016-08-01 NOTE — ED Provider Notes (Signed)
[facility] Emergency Department Provider Note   ____________________________________________   First MD Initiated Contact with Patient 08/01/16 1545     (approximate)  I have reviewed the triage vital signs and the nursing notes.   HISTORY  Chief Complaint Loss of Consciousness    HPI Latoya Luna is a 30 y.o. female reports a previous history of passing out several times, currently breast-feeding, and recently diagnosed with a sore throat. Patient reports the throat really hasn't been bothering her much, no fevers, no trouble swallowing,but she's been under stress lately and she thinks because that yesterday she had a special [place], may be a hot dog, and nothing else to eat. She felt dehydrated last night. This morning while in her home, she was standing began feeling lightheaded and then a witnessed to have a brief episode of passing out. Reports the symptoms have never multiple times in the past and she is been "dehydrated".  She reports she feels fine now, denies any injury. Denies pregnancy. She does report that she's had a slight burning feeling in the vagina last couple of days, was seen in the ER yesterday and diagnosed with vaginitis. She tells you that she would like something to take a second look, as she is concerned she needs to have it more fully diagnosed, and she can't see her gynecologist Wednesday.  Caries a questionable history of seizure, but reports she has not had one in todayactivity noted   Past Medical History:  Diagnosis Date  . Anxiety   . Cancer ([place])   . Depression   . Seizures ([place])    last seizure 5 years ago    Patient Active Problem List   Diagnosis Date Noted  . Labor and delivery, indication for care 04/09/2015  . Indication for care or intervention related to labor and delivery 04/09/2015  . Vaginal delivery 04/09/2015  . 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 Episode of syncope 11/04/2014  . History of brain disorder  09/04/2014    Past Surgical History:  Procedure Laterality Date  . NO PAST SURGERIES      Prior to Admission medications   Medication Sig Start Date End Date Taking? Authorizing Provider  LORazepam (ATIVAN) 0.5 MG tablet Take 0.5 mg by mouth 2 (two) times daily as needed for anxiety.     Historical Provider, MD  metroNIDAZOLE (FLAGYL) 500 MG tablet Take 1 tablet (500 mg total) by mouth 2 (two) times daily. 07/31/16 08/10/16  Sable Feil, PA-C  oxyCODONE-acetaminophen (ROXICET) 5-325 MG tablet Take 1 tablet by mouth every 4 (four) hours as needed for severe pain. 03/08/16   Gregor Hams, MD  Prenatal Vit-Fe Fumarate-FA (PRENATAL MULTIVITAMIN) TABS tablet Take 1 tablet by mouth daily.     Historical Provider, MD  sertraline (ZOLOFT) 100 MG tablet Take 100 mg by mouth daily.    Historical Provider, MD  sertraline (ZOLOFT) 25 MG tablet Take 2 tablets (50 mg total) by mouth daily. Patient not taking: Reported on 10/21/2015 09/04/15   Gonzella Lex, MD  valACYclovir (VALTREX) 1000 MG tablet Take 1 tablet (1,000 mg total) by mouth 2 (two) times daily. 08/01/16 08/11/16  Delman Kitten, MD    Allergies Review of patient's allergies indicates no known allergies.  No family history on file.  Social History Social History  Substance Use Topics  . Smoking status: Never Smoker  . Smokeless tobacco: Never Used  . Alcohol use No    Review of Systems Constitutional: No fever/chills  Eyes: No visual changes. ENT: See history of present illness Cardiovascular: Denies chest pain. Respiratory: Denies shortness of breath. Gastrointestinal: No abdominal pain.  No nausea, no vomiting.  No diarrhea.  No constipation. Genitourinary: Negative for dysuria. Musculoskeletal: Negative for back pain. Skin: Negative for rash. Neurological: Negative for headaches, focal weakness or numbness.  10-point ROS otherwise negative.  ____________________________________________   PHYSICAL EXAM:  VITAL  SIGNS: ED Triage Vitals [08/01/16 1440]  Enc Vitals Group     BP 101/71     Pulse Rate 93     Resp 16     Temp 98.1 F (36.7 C)     Temp Source Oral     SpO2 98 %     Weight 145 lb (65.8 kg)     Height 5\' 7"  (1.702 m)     Head Circumference      Peak Flow      Pain Score 6     Pain Loc      Pain Edu?      Excl. in [place]?     Constitutional: Alert and oriented. Well appearing and in no acute distress. Eyes: Conjunctivae are normal. PERRL. EOMI. Head: Atraumatic. Nose: No congestion/rhinnorhea. Mouth/Throat: Mucous membranes are moist.  Oropharynx non-erythematous. Neck: No stridor.  No cervical spine tenderness Cardiovascular: Normal rate, regular rhythm. Grossly normal heart sounds.  Good peripheral circulation. Respiratory: Normal respiratory effort.  No retractions. Lungs CTAB. Gastrointestinal: Soft and nontender. No distention.  Musculoskeletal: No lower extremity tenderness nor edema.  No joint effusions. GYN Performed with nurse Terrence Dupont. Normal external examination of the labia, aside from a small slight papular appearing lesion with minimal ulceration and erythema at the base noted at the inferior portion of the left labia. Patient describes a "burning" feeling there. Appears most consistent with possible herpes simplex type lesion. Neurologic:  Normal speech and language. No gross focal neurologic deficits are appreciated. Skin:  Skin is warm, dry and intact. No rash noted. Psychiatric: Mood and affect are normal. Speech and behavior are normal.  ____________________________________________   LABS (all labs ordered are listed, but only abnormal results are displayed)  Labs Reviewed  COMPREHENSIVE METABOLIC PANEL - Abnormal; Notable for the following:       Result Value   Calcium 8.8 (*)    All other components within normal limits  CBC WITH DIFFERENTIAL/PLATELET  TROPONIN I  MAGNESIUM   ____________________________________________  EKG  Reviewed and interpreted  at 1415 Infiltrate 90 QRS 70 QTc 4:30 Normal sinus rhythm, no evidence of ischemic change. No Brug[place], no prolonged QT. No WPW ____________________________________________  RADIOLOGY  Negative by [place] head CT rule ____________________________________________   PROCEDURES  Procedure(s) performed: None  Procedures  Critical Care performed: No  ____________________________________________   INITIAL IMPRESSION / ASSESSMENT AND PLAN / ED COURSE  Pertinent labs & imaging results that were available during my care of the patient were reviewed by me and considered in my medical decision making (see chart for details).  Patient answer syncope. Full neurologic recovery. No preceding chest pain or palpitations. No concerning symptoms noted, and likely due to dehydration from apparently not eating anything today and barely eating anything yesterday. She had a slight sore throat, reports assisting better, and her evaluation demonstrates no concerns for bacterial pharyngitis at this time or evidence to support severe pharyngitis or pharyngeal abscess. She is able to drink juice well, hydrating here.   Clinical Course   ----------------------------------------- 5:33 PM on 08/01/2016 -----------------------------------------  Patient is eating and drinking  well. She reports she feels well and ready to go home. Pain is better with ibuprofen, and we discussed and the patient has elected to receive a prescription for Valtrex after discussing risks and benefits and that she will not breast feed for 24 hours after or while using Valtrex. She'll follow closely with her own OB/GYN this week.  Return precautions and treatment recommendations and follow-up discussed with the patient who is agreeable with the plan.   ____________________________________________   FINAL CLINICAL IMPRESSION(S) / ED DIAGNOSES  Final diagnoses:  Syncope and collapse  Dehydration  Herpes labialis      NEW  MEDICATIONS STARTED DURING THIS VISIT:  New Prescriptions   VALACYCLOVIR (VALTREX) 1000 MG TABLET    Take 1 tablet (1,000 mg total) by mouth 2 (two) times daily.     Note:  This document was prepared using Dragon voice recognition software and may include unintentional dictation errors.     Delman Kitten, MD 08/01/16 [phone]

## 2016-08-01 NOTE — ED Triage Notes (Signed)
Sore throat yesterday. Today no appetite and passed out today.  On arrival pt is a&o, skin w/d with good color.

## 2016-08-01 NOTE — ED Notes (Signed)
Patient states she passed out today while cleaning and picking things up off the floor. Hx of syncope. Patient states she is also concerned about some vaginal irritation she has been experiencing, recent rx with bacterial vaginosis. 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

## 2017-01-09 ENCOUNTER — Emergency Department (HOSPITAL_COMMUNITY): Payer: Medicaid Other

## 2017-01-09 ENCOUNTER — Emergency Department (HOSPITAL_COMMUNITY)
Admission: EM | Admit: 2017-01-09 | Discharge: 2017-01-09 | Disposition: A | Discharge status: Home / Self Care | Payer: Medicaid Other | Attending: Emergency Medicine | Admitting: Emergency Medicine

## 2017-01-09 ENCOUNTER — Encounter (HOSPITAL_COMMUNITY): Payer: Self-pay

## 2017-01-09 DIAGNOSIS — R55 Syncope and collapse: Secondary | ICD-10-CM | POA: Insufficient documentation

## 2017-01-09 DIAGNOSIS — Z7982 Long term (current) use of aspirin: Secondary | ICD-10-CM | POA: Insufficient documentation

## 2017-01-09 DIAGNOSIS — Z8541 Personal history of malignant neoplasm of cervix uteri: Secondary | ICD-10-CM | POA: Diagnosis not present

## 2017-01-09 LAB — CBC
HEMATOCRIT: 38.2 % (ref 36.0–46.0)
Hemoglobin: 13.2 g/dL (ref 12.0–15.0)
MCH: 31.7 pg (ref 26.0–34.0)
MCHC: 34.6 g/dL (ref 30.0–36.0)
MCV: 91.8 fL (ref 78.0–100.0)
PLATELETS: 228 10*3/uL (ref 150–400)
RBC: 4.16 MIL/uL (ref 3.87–5.11)
RDW: 12.6 % (ref 11.5–15.5)
WBC: 4.4 10*3/uL (ref 4.0–10.5)

## 2017-01-09 LAB — BASIC METABOLIC PANEL
Anion gap: 9 (ref 5–15)
BUN: 13 mg/dL (ref 6–20)
CO2: 25 mmol/L (ref 22–32)
CREATININE: 0.67 mg/dL (ref 0.44–1.00)
Calcium: 9.1 mg/dL (ref 8.9–10.3)
Chloride: 106 mmol/L (ref 101–111)
GFR calc Af Amer: >60 mL/min (ref >60)
GLUCOSE: 93 mg/dL (ref 65–99)
POTASSIUM: 3.6 mmol/L (ref 3.5–5.1)
SODIUM: 140 mmol/L (ref 135–145)

## 2017-01-09 LAB — URINALYSIS, ROUTINE W REFLEX MICROSCOPIC
BILIRUBIN URINE: NEGATIVE
GLUCOSE, UA: NEGATIVE mg/dL
Hgb urine dipstick: NEGATIVE
Ketones, ur: NEGATIVE mg/dL
Leukocytes, UA: NEGATIVE
Nitrite: NEGATIVE
PH: 8 (ref 5.0–8.0)
PROTEIN: NEGATIVE mg/dL
Specific Gravity, Urine: 1.011 (ref 1.005–1.030)

## 2017-01-09 LAB — I-STAT BETA HCG BLOOD, ED (MC, WL, AP ONLY)

## 2017-01-09 LAB — CBG MONITORING, ED: Glucose-Capillary: 88 mg/dL (ref 65–99)

## 2017-01-09 MED ORDER — SODIUM CHLORIDE 0.9 % IV BOLUS (SEPSIS)
1000.0000 mL | Freq: Once | INTRAVENOUS | Status: AC
  Administered 2017-01-09: 1000 mL via INTRAVENOUS

## 2017-01-09 MED ORDER — ACETAMINOPHEN 500 MG PO TABS
1000.0000 mg | ORAL_TABLET | Freq: Once | ORAL | Status: AC
  Administered 2017-01-09: 1000 mg via ORAL
  Filled 2017-01-09: qty 2

## 2017-01-09 NOTE — ED Provider Notes (Addendum)
[facility] DEPT Provider Note   CSN: [id number] Arrival date & time: 01/09/17  1619     History   Chief Complaint Chief Complaint  Patient presents with  . Loss of Consciousness    HPI Latoya Luna is a 31 y.o. female.Patient had 2 syncopal events in the past 2 days, 1 yesterday afternoon 1 today, today while at work. She had a gradual onset vague headache yesterday before the syncopal event. Which became worse after initial stay syncopal event. Described as pressure at the vertex of her scalp. No visual changes. No focal numbness or weakness. No loss of bladder or bowel control she did not bite her tongue. Her coworkers today said that she was unconscious for proximal may 15 minutes. She had multiple syncopal events while pregnant 20 months ago .Marland Kitchen She did wear a heart monitor and saw a cardiologist for syncopal events she reports that she was told that it was syncope secondary to anxiety and stress which she denies any shortness of breath any nausea or chest pain. No fever. No other associated symptoms. She does complain of mild generalized weakness at present, worse with standing and improved with lying supine.  HPI  Past Medical History:  Diagnosis Date  . Anxiety   . 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 Depression   . Seizures ([place])    last seizure 5 years ago    Patient Active Problem List   Diagnosis Date Noted  . Labor and delivery, indication for care 04/09/2015  . Indication for care or intervention related to labor and delivery 04/09/2015  . Vaginal delivery 04/09/2015  . 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 Episode of syncope 11/04/2014  . 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 NO PAST SURGERIES      OB History    Gravida Para Term Preterm AB Living   3 2 2  0 0 2   SAB TAB Ectopic Multiple Live Births   0 0 0 0         Home Medications    Prior to Admission medications   Medication Sig Start Date End  Date Taking? Authorizing Provider  aspirin-acetaminophen-caffeine (EXCEDRIN MIGRAINE) [phone] MG tablet Take 2 tablets by mouth every 6 (six) hours as needed for headache.   Yes Historical Provider, MD  Multiple Vitamin (MULTIVITAMIN WITH MINERALS) TABS tablet Take 1 tablet by mouth daily.   Yes Historical Provider, MD  sertraline (ZOLOFT) 25 MG tablet Take 2 tablets (50 mg total) by mouth daily. Patient not taking: Reported on 10/21/2015 09/04/15   Gonzella Lex, MD    Family History History reviewed. No pertinent family history.  Social History Social History  Substance Use Topics  . Smoking status: Never Smoker  . Smokeless tobacco: Never Used  . Alcohol use No     Allergies   Patient has no known allergies.   Review of Systems Review of Systems  HENT: Negative.   Respiratory: Negative.   Cardiovascular: Negative.   Gastrointestinal: Negative.   Genitourinary:       Currently amenorrheic. She is breast-feeding and has explanon   Musculoskeletal: Negative.   Skin: Negative.   Neurological: Positive for weakness and headaches.       Generalized weakness  Psychiatric/Behavioral: Negative.   All other systems reviewed and are negative.    Physical Exam Updated Vital Signs BP 118/80 (BP Location: Left Arm)   Pulse 92   Temp 98.4  F (36.9 C) (Oral)   Resp 18   SpO2 100%   Physical Exam  Constitutional: She is oriented to person, place, and time. She appears well-developed and well-nourished.  HENT:  Head: Normocephalic and atraumatic.  Right Ear: External ear normal.  Left Ear: External ear normal.  Eyes: Conjunctivae are normal. Pupils are equal, round, and reactive to light.  Optic discs sharp bilaterally  Neck: Neck supple. No tracheal deviation present. No thyromegaly present.  Cardiovascular: Normal rate and regular rhythm.   No murmur heard. Pulmonary/Chest: Effort normal and breath sounds normal.  Abdominal: Soft. Bowel sounds are normal. She  exhibits no distension. There is no tenderness.  Musculoskeletal: Normal range of motion. She exhibits no edema or tenderness.  Neurological: She is alert and oriented to person, place, and time. Coordination normal.  Gait normal Romberg normal pronator drift normal. DTR symmetric bilaterally at knee jerk ankle jerk and biceps toes downward going bilaterally. She is mildly lightheaded upon standing  Skin: Skin is warm and dry. No rash noted.  Psychiatric: She has a normal mood and affect.  Nursing note and vitals reviewed.    ED Treatments / Results  Labs (all labs ordered are listed, but only abnormal results are displayed) Labs Reviewed  URINALYSIS, ROUTINE W REFLEX MICROSCOPIC - Abnormal; Notable for the following:       Result Value   APPearance CLOUDY (*)    All other components within normal limits  BASIC METABOLIC PANEL  CBC  CBG MONITORING, ED  I-STAT BETA HCG BLOOD, ED ([facility], [facility], [facility] ONLY)    EKG  EKG Interpretation  Date/Time:  Monday January 09 2017 16:31:26 EST Ventricular Rate:  83 PR Interval:    QRS Duration: 73 QT Interval:  364 QTC Calculation: 428 R Axis:   89 Text Interpretation:  Sinus rhythm Anterior infarct, old No significant change since last tracing Confirmed by Winfred Leeds  MD, Dare Sanger [phone]) on 01/09/2017 4:35:47 PM       Radiology Ct Head Wo Contrast  Result Date: 01/09/2017 CLINICAL DATA:  Per EMS pt from work. Pt had syncopal episode last night a 5pm and then today at 3:30 pm. Unwitnessed. Pt found in floor. Pt c/o headache. Pt denies n/v, cough or fever. 18g LFA Vitals: 115/74, hr 96, 99% on room air, respiration 16 EXAM: CT HEAD WITHOUT CONTRAST TECHNIQUE: Contiguous axial images were obtained from the base of the skull through the vertex without intravenous contrast. COMPARISON:  03/08/2016 FINDINGS: Brain: No evidence of acute infarction, hemorrhage, hydrocephalus, extra-axial collection or mass lesion/mass effect. Vascular: No hyperdense vessel or  unexpected calcification. Skull: Normal. Negative for fracture or focal lesion. Sinuses/Orbits: No acute finding. Other: None IMPRESSION: Negative. Electronically Signed   By: Nolon Nations M.D.   On: 01/09/2017 20:45    Procedures Procedures (including critical care time)  Medications Ordered in ED Medications  acetaminophen (TYLENOL) tablet 1,000 mg (1,000 mg Oral Given 01/09/17 2015)  sodium chloride 0.9 % bolus 1,000 mL (1,000 mLs Intravenous New Bag/Given 01/09/17 2015)   Results for orders placed or performed during the hospital encounter of [id number]  Basic metabolic panel  Result Value Ref Range   Sodium 140 135 - 145 mmol/L   Potassium 3.6 3.5 - 5.1 mmol/L   Chloride 106 101 - 111 mmol/L   CO2 25 22 - 32 mmol/L   Glucose, Bld 93 65 - 99 mg/dL   BUN 13 6 - 20 mg/dL   Creatinine, Ser 0.67 0.44 - 1.00 mg/dL  Calcium 9.1 8.9 - 10.3 mg/dL   GFR calc non Af Amer >60 >60 mL/min   GFR calc Af Amer >60 >60 mL/min   Anion gap 9 5 - 15  CBC  Result Value Ref Range   WBC 4.4 4.0 - 10.5 K/uL   RBC 4.16 3.87 - 5.11 MIL/uL   Hemoglobin 13.2 12.0 - 15.0 g/dL   HCT 38.2 36.0 - 46.0 %   MCV 91.8 78.0 - 100.0 fL   MCH 31.7 26.0 - 34.0 pg   MCHC 34.6 30.0 - 36.0 g/dL   RDW 12.6 11.5 - 15.5 %   Platelets 228 150 - 400 K/uL  Urinalysis, Routine w reflex microscopic  Result Value Ref Range   Color, Urine YELLOW YELLOW   APPearance CLOUDY (A) CLEAR   Specific Gravity, Urine 1.011 1.005 - 1.030   pH 8.0 5.0 - 8.0   Glucose, UA NEGATIVE NEGATIVE mg/dL   Hgb urine dipstick NEGATIVE NEGATIVE   Bilirubin Urine NEGATIVE NEGATIVE   Ketones, ur NEGATIVE NEGATIVE mg/dL   Protein, ur NEGATIVE NEGATIVE mg/dL   Nitrite NEGATIVE NEGATIVE   Leukocytes, UA NEGATIVE NEGATIVE  CBG monitoring, ED  Result Value Ref Range   Glucose-Capillary 88 65 - 99 mg/dL  I-Stat Beta hCG blood, ED ([facility], [facility], [facility] only)  Result Value Ref Range   I-stat hCG, quantitative <5.0 <5 mIU/mL   Comment 3           Ct  Head Wo Contrast  Result Date: 01/09/2017 CLINICAL DATA:  Per EMS pt from work. Pt had syncopal episode last night a 5pm and then today at 3:30 pm. Unwitnessed. Pt found in floor. Pt c/o headache. Pt denies n/v, cough or fever. 18g LFA Vitals: 115/74, hr 96, 99% on room air, respiration 16 EXAM: CT HEAD WITHOUT CONTRAST TECHNIQUE: Contiguous axial images were obtained from the base of the skull through the vertex without intravenous contrast. COMPARISON:  03/08/2016 FINDINGS: Brain: No evidence of acute infarction, hemorrhage, hydrocephalus, extra-axial collection or mass lesion/mass effect. Vascular: No hyperdense vessel or unexpected calcification. Skull: Normal. Negative for fracture or focal lesion. Sinuses/Orbits: No acute finding. Other: None IMPRESSION: Negative. Electronically Signed   By: Nolon Nations M.D.   On: 01/09/2017 20:45    Initial Impression / Assessment and Plan / ED Course  I have reviewed the triage vital signs and the nursing notes.  Pertinent labs & imaging results that were available during my care of the patient were reviewed by me and considered in my medical decision making (see chart for details).     10:10 p.m. headache and lightheadedness improved after treatment with Tylenol and intravenous fluids. She is alert and ambulatory on discharge  PlanEncourage oral hydration I feel that patient can be further evaluated as outpatient. EKG essentially normal. Patient reports that she had seizures  years ago and symptoms over the past 2 days did not resemble seizure she's had in the past. I suggest she follow-up with her PCP tomorrow and avoid driving until further evaluation. She may need outpatient neurologic evaluation or repeat cardiac monitoring Final Clinical Impressions(s) / ED Diagnoses  Diagnosis syncope Final diagnoses:  None    New Prescriptions New Prescriptions   No medications on file     Orlie Dakin, MD 01/09/17 [place],  MD 01/09/17 [place], MD 01/09/17 2221

## 2017-01-09 NOTE — Discharge Instructions (Signed)
Make sure that you drink at least six 8 ounce glasses of water or Gatorade each day in order to stay well-hydrated. Call your primary care physician tomorrow to schedule the next available appointment. We feel that you may need a repeat heart monitor (called an event monitor) or may need evaluation by a neurologist. Don't drive or operate heavy machinery until cleared by your physician

## 2017-01-09 NOTE — ED Triage Notes (Signed)
Per EMS pt from work.  Pt had syncopal episode last night a 5pm and then today at 3:30 pm.  Unwitnessed.  Pt found in floor. Pt c/o headache.  Pt denies n/v, cough or fever.  18g LFA  Vitals: 115/74, hr 96, 99% ra, resp 16

## 2017-09-09 ENCOUNTER — Encounter: Payer: Self-pay | Admitting: Emergency Medicine

## 2017-09-09 ENCOUNTER — Emergency Department
Admission: EM | Admit: 2017-09-09 | Discharge: 2017-09-09 | Disposition: A | Discharge status: Home / Self Care | Payer: Self-pay | Attending: Emergency Medicine | Admitting: Emergency Medicine

## 2017-09-09 DIAGNOSIS — N3090 Cystitis, unspecified without hematuria: Secondary | ICD-10-CM

## 2017-09-09 DIAGNOSIS — N760 Acute vaginitis: Secondary | ICD-10-CM

## 2017-09-09 DIAGNOSIS — N3000 Acute cystitis without hematuria: Secondary | ICD-10-CM | POA: Insufficient documentation

## 2017-09-09 DIAGNOSIS — Z79899 Other long term (current) drug therapy: Secondary | ICD-10-CM | POA: Insufficient documentation

## 2017-09-09 LAB — URINALYSIS, COMPLETE (UACMP) WITH MICROSCOPIC
BACTERIA UA: NONE SEEN
Bilirubin Urine: NEGATIVE
Glucose, UA: NEGATIVE mg/dL
HGB URINE DIPSTICK: NEGATIVE
Ketones, ur: NEGATIVE mg/dL
Leukocytes, UA: NEGATIVE
Nitrite: POSITIVE — AB
PH: 8 (ref 5.0–8.0)
Protein, ur: NEGATIVE mg/dL
SPECIFIC GRAVITY, URINE: 1.003 — AB (ref 1.005–1.030)

## 2017-09-09 LAB — WET PREP, GENITAL
Clue Cells Wet Prep HPF POC: NONE SEEN
Sperm: NONE SEEN
TRICH WET PREP: NONE SEEN
Yeast Wet Prep HPF POC: NONE SEEN

## 2017-09-09 LAB — CHLAMYDIA/NGC RT PCR (ARMC ONLY)
CHLAMYDIA TR: NOT DETECTED
N GONORRHOEAE: NOT DETECTED

## 2017-09-09 LAB — PREGNANCY, URINE: Preg Test, Ur: NEGATIVE

## 2017-09-09 MED ORDER — METRONIDAZOLE 500 MG PO TABS: 500.0000 mg | ORAL_TABLET | Freq: Two times a day (BID) | ORAL | 0 refills | Status: AC

## 2017-09-09 MED ORDER — CEPHALEXIN 500 MG PO CAPS: 500.0000 mg | ORAL_CAPSULE | Freq: Two times a day (BID) | ORAL | 0 refills | Status: AC

## 2017-09-09 MED ORDER — FLUCONAZOLE 100 MG PO TABS: 150.0000 mg | ORAL_TABLET | Freq: Every day | ORAL | 0 refills | Status: AC

## 2017-09-09 NOTE — ED Triage Notes (Signed)
Dysuria x 2 days.

## 2017-09-09 NOTE — ED Notes (Signed)
Patient presents to the ED with lower back pain and lower abdominal pain.  Patient reports urinary frequency.  Patient is in no obvious distress at this time.

## 2017-09-09 NOTE — ED Provider Notes (Signed)
[facility] Emergency Department Provider Note  ____________________________________________  Time seen: Approximately 11:26 AM  I have reviewed the triage vital signs and the nursing notes.   HISTORY  Chief Complaint Dysuria    HPI Latoya Luna is a 31 y.o. female that presents to the emergency department for evaluation of dysuria and low back pain for 2 days. She states that it "feels strange down there." She states that she drinks a lot of water so it is hard to tell if she is urinating more than normal. Patient states that this feels the same as a urinary tract infection she had in past. She would like to be evaluated for yeast. She denies any vaginal discharge. She has a history of BV. She has an Implanon. No fever, nausea, vomiting, abdominal pain, urgency.   Past Medical History:  Diagnosis Date  . Anxiety   . 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 Depression   . Seizures ([place])    last seizure 5 years ago    Patient Active Problem List   Diagnosis Date Noted  . Labor and delivery, indication for care 04/09/2015  . Indication for care or intervention related to labor and delivery 04/09/2015  . Vaginal delivery 04/09/2015  . 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 Episode of syncope 11/04/2014  . 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 NO PAST SURGERIES      Prior to Admission medications   Medication Sig Start Date End Date Taking? Authorizing Provider  aspirin-acetaminophen-caffeine (EXCEDRIN MIGRAINE) [phone] MG tablet Take 2 tablets by mouth every 6 (six) hours as needed for headache.    [provider]  cephALEXin (KEFLEX) 500 MG capsule Take 1 capsule (500 mg total) by mouth 2 (two) times daily. 09/09/17 09/19/17  Laban Emperor, PA-C  fluconazole (DIFLUCAN) 100 MG tablet Take 1.5 tablets (150 mg total) by mouth daily. 09/09/17 09/10/17  Laban Emperor, PA-C  metroNIDAZOLE (FLAGYL)  500 MG tablet Take 1 tablet (500 mg total) by mouth 2 (two) times daily. 09/09/17 09/16/17  Laban Emperor, PA-C  Multiple Vitamin (MULTIVITAMIN WITH MINERALS) TABS tablet Take 1 tablet by mouth daily.    [provider]  sertraline (ZOLOFT) 25 MG tablet Take 2 tablets (50 mg total) by mouth daily. Patient not taking: Reported on 10/21/2015 09/04/15   Clapacs, Madie Reno, MD    Allergies Patient has no known allergies.  No family history on file.  Social History Social History  Substance Use Topics  . Smoking status: Never Smoker  . Smokeless tobacco: Never Used  . Alcohol use No     Review of Systems  Constitutional: No fever/chills Cardiovascular: No chest pain. Respiratory: No SOB. Gastrointestinal: No abdominal pain.  No nausea, no vomiting.  Skin: Negative for rash, abrasions, lacerations, ecchymosis.   ____________________________________________   PHYSICAL EXAM:  VITAL SIGNS: ED Triage Vitals  Enc Vitals Group     BP 09/09/17 1042 107/65     Pulse Rate 09/09/17 1042 78     Resp 09/09/17 1042 18     Temp 09/09/17 1042 98.9 F (37.2 C)     Temp Source 09/09/17 1042 Oral     SpO2 09/09/17 1042 98 %     Weight 09/09/17 1043 140 lb (63.5 kg)     Height 09/09/17 1043 5\' 7"  (1.702 m)     Head Circumference --      Peak Flow --  Pain Score 09/09/17 1042 4     Pain Loc --      Pain Edu? --      Excl. in [place]? --      Constitutional: Alert and oriented. Well appearing and in no acute distress. Eyes: Conjunctivae are normal. PERRL. EOMI. Head: Atraumatic. ENT:      Ears:      Nose: No congestion/rhinnorhea.      Mouth/Throat: Mucous membranes are moist.  Neck: No stridor.   Cardiovascular: Normal rate, regular rhythm.  Good peripheral circulation. Respiratory: Normal respiratory effort without tachypnea or retractions. Lungs CTAB. Good air entry to the bases with no decreased or absent breath sounds. Gastrointestinal: Bowel sounds 4 quadrants. Soft  and nontender to palpation. No guarding or rigidity. No palpable masses. No distention. No CVA tenderness. Musculoskeletal: Full range of motion to all extremities. No gross deformities appreciated. Genitourinary: RN Vicente Males present for pelvic exam. No external rashes or lesions seen. White thin discharge in vaginal canal. No cervical motion tenderness. Neurologic:  Normal speech and language. No gross focal neurologic deficits are appreciated.  Skin:  Skin is warm, dry and intact. No rash noted.   ____________________________________________   LABS (all labs ordered are listed, but only abnormal results are displayed)  Labs Reviewed  WET PREP, GENITAL - Abnormal; Notable for the following:       Result Value   WBC, Wet Prep HPF POC FEW (*)    All other components within normal limits  URINALYSIS, COMPLETE (UACMP) WITH MICROSCOPIC - Abnormal; Notable for the following:    Color, Urine AMBER (*)    APPearance CLEAR (*)    Specific Gravity, Urine 1.003 (*)    Nitrite POSITIVE (*)    Squamous Epithelial / LPF 0-5 (*)    All other components within normal limits  CHLAMYDIA/NGC RT PCR ([facility] ONLY)  PREGNANCY, URINE   ____________________________________________  EKG   ____________________________________________  RADIOLOGY  No results found.  ____________________________________________    PROCEDURES  Procedure(s) performed:    Procedures    Medications - No data to display   ____________________________________________   INITIAL IMPRESSION / ASSESSMENT AND PLAN / ED COURSE  Pertinent labs & imaging results that were available during my care of the patient were reviewed by me and considered in my medical decision making (see chart for details).  Review of the [place] CSRS was performed in accordance of the [place] prior to dispensing any controlled drugs.   She presented to the emergency department for evaluation of dysuria. Vital signs and exam are  reassuring. Urinalysis is positive for nitrates. Wet prep shows white blood cells. No chlamydia, gonorrhea, Trichomonas, yeast, BV. Pregnancy test negative. Patient will be discharged home with prescriptions for Keflex, Flagyl. Patient also like a prophylactic prescription for Diflucan because she frequently gets yeast infection with antibiotics. Patient is to follow up with PCP as directed. Patient is given ED precautions to return to the ED for any worsening or new symptoms.     ____________________________________________  FINAL CLINICAL IMPRESSION(S) / ED DIAGNOSES  Final diagnoses:  Cystitis  Acute vaginitis      NEW MEDICATIONS STARTED DURING THIS VISIT:  Discharge Medication List as of 09/09/2017 12:59 PM    START taking these medications   Details  cephALEXin (KEFLEX) 500 MG capsule Take 1 capsule (500 mg total) by mouth 2 (two) times daily., Starting Sat 09/09/2017, Until Tue 09/19/2017, Print    metroNIDAZOLE (FLAGYL) 500 MG tablet Take 1 tablet (500 mg total) by  mouth 2 (two) times daily., Starting Sat 09/09/2017, Until Sat 09/16/2017, Print            This chart was dictated using voice recognition software/Dragon. Despite best efforts to proofread, errors can occur which can change the meaning. Any change was purely unintentional.    Laban Emperor, PA-C 09/09/17 Kenton, Randall An, MD 09/10/17 [phone]

## 2018-08-03 ENCOUNTER — Other Ambulatory Visit: Payer: Self-pay

## 2018-08-03 ENCOUNTER — Emergency Department
Admission: EM | Admit: 2018-08-03 | Discharge: 2018-08-04 | Disposition: A | Discharge status: Home / Self Care | Payer: BLUE CROSS/BLUE SHIELD | Attending: Emergency Medicine | Admitting: Emergency Medicine

## 2018-08-03 DIAGNOSIS — M542 Cervicalgia: Secondary | ICD-10-CM | POA: Diagnosis not present

## 2018-08-03 DIAGNOSIS — Y939 Activity, unspecified: Secondary | ICD-10-CM | POA: Insufficient documentation

## 2018-08-03 DIAGNOSIS — S6992XA Unspecified injury of left wrist, hand and finger(s), initial encounter: Secondary | ICD-10-CM | POA: Diagnosis present

## 2018-08-03 DIAGNOSIS — S0990XA Unspecified injury of head, initial encounter: Secondary | ICD-10-CM

## 2018-08-03 DIAGNOSIS — Z79899 Other long term (current) drug therapy: Secondary | ICD-10-CM | POA: Diagnosis not present

## 2018-08-03 DIAGNOSIS — R55 Syncope and collapse: Secondary | ICD-10-CM | POA: Diagnosis not present

## 2018-08-03 DIAGNOSIS — M533 Sacrococcygeal disorders, not elsewhere classified: Secondary | ICD-10-CM | POA: Insufficient documentation

## 2018-08-03 DIAGNOSIS — Y999 Unspecified external cause status: Secondary | ICD-10-CM | POA: Insufficient documentation

## 2018-08-03 DIAGNOSIS — Y929 Unspecified place or not applicable: Secondary | ICD-10-CM | POA: Diagnosis not present

## 2018-08-03 DIAGNOSIS — S62102A Fracture of unspecified carpal bone, left wrist, initial encounter for closed fracture: Secondary | ICD-10-CM | POA: Insufficient documentation
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
## 2018-08-04 ENCOUNTER — Emergency Department: Payer: BLUE CROSS/BLUE SHIELD

## 2018-08-04 LAB — POCT PREGNANCY, URINE: Preg Test, Ur: NEGATIVE

## 2018-08-04 MED ORDER — ONDANSETRON 4 MG PO TBDP
4.0000 mg | ORAL_TABLET | Freq: Once | ORAL | Status: AC
  Administered 2018-08-04: 4 mg via ORAL
  Filled 2018-08-04: qty 1

## 2018-08-04 MED ORDER — HYDROCODONE-ACETAMINOPHEN 5-325 MG PO TABS: 1.0000 | ORAL_TABLET | Freq: Four times a day (QID) | ORAL | 0 refills | Status: DC | PRN

## 2018-08-04 MED ORDER — IBUPROFEN 600 MG PO TABS: 600.0000 mg | ORAL_TABLET | Freq: Three times a day (TID) | ORAL | 0 refills | Status: DC | PRN

## 2018-08-04 MED ORDER — HYDROCODONE-ACETAMINOPHEN 5-325 MG PO TABS
1.0000 | ORAL_TABLET | Freq: Once | ORAL | Status: AC
  Administered 2018-08-04: 1 via ORAL
  Filled 2018-08-04: qty 1

## 2018-08-04 NOTE — ED Notes (Signed)
Additional pillow provided. Vss. Pt denies further needs. Explanation of process regarding ct results provided.

## 2018-08-04 NOTE — ED Notes (Signed)
Report from [place], rn.
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
## 2018-08-04 NOTE — ED Notes (Signed)
Advocate and SANE rN in with pt.

## 2018-08-04 NOTE — Discharge Instructions (Addendum)
1.  You may take pain medicines as needed (Motrin/Norco). 2.  Keep splint clean and dry.  Elevate affected area and apply ice over splint several times daily to reduce swelling. 3.  Return to the ER for worsening symptoms, persistent vomiting, lethargy or other concerns.

## 2018-08-04 NOTE — SANE Note (Signed)
Follow-up Phone Call  Patient gives verbal consent for a FNE/SANE follow-up phone call in 48-72 hours: No Patient's telephone number: not given Patient gives verbal consent to leave voicemail at the phone number listed above: No DO NOT CALL between the hours of: Does not wish to be contacted
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
## 2018-08-15 ENCOUNTER — Other Ambulatory Visit: Payer: Self-pay | Admitting: Orthopaedic Surgery

## 2018-08-15 DIAGNOSIS — S62014A Nondisplaced fracture of distal pole of navicular [scaphoid] bone of right wrist, initial encounter for closed fracture: Secondary | ICD-10-CM

## 2018-08-15 DIAGNOSIS — S62015A Nondisplaced fracture of distal pole of navicular [scaphoid] bone of left wrist, initial encounter for closed fracture: Secondary | ICD-10-CM

## 2018-08-23 ENCOUNTER — Ambulatory Visit
Admission: RE | Admit: 2018-08-23 | Discharge: 2018-08-23 | Disposition: A | Discharge status: Home / Self Care | Payer: BLUE CROSS/BLUE SHIELD | Source: Ambulatory Visit | Attending: Orthopaedic Surgery | Admitting: Orthopaedic Surgery

## 2018-08-23 ENCOUNTER — Ambulatory Visit: Payer: BLUE CROSS/BLUE SHIELD

## 2018-08-23 DIAGNOSIS — S52591A Other fractures of lower end of right radius, initial encounter for closed fracture: Secondary | ICD-10-CM | POA: Insufficient documentation

## 2018-08-23 DIAGNOSIS — S52552A Other extraarticular fracture of lower end of left radius, initial encounter for closed fracture: Secondary | ICD-10-CM | POA: Diagnosis not present

## 2018-08-23 DIAGNOSIS — S62015A Nondisplaced fracture of distal pole of navicular [scaphoid] bone of left wrist, initial encounter for closed fracture: Secondary | ICD-10-CM | POA: Diagnosis present

## 2018-08-23 DIAGNOSIS — S62014A Nondisplaced fracture of distal pole of navicular [scaphoid] bone of right wrist, initial encounter for closed fracture: Secondary | ICD-10-CM | POA: Diagnosis not present

## 2018-08-23 DIAGNOSIS — M7989 Other specified soft tissue disorders: Secondary | ICD-10-CM | POA: Diagnosis not present

## 2018-12-20 ENCOUNTER — Ambulatory Visit (INDEPENDENT_AMBULATORY_CARE_PROVIDER_SITE_OTHER): Payer: BLUE CROSS/BLUE SHIELD | Admitting: Maternal Newborn

## 2018-12-20 ENCOUNTER — Encounter: Payer: Self-pay | Admitting: Maternal Newborn

## 2018-12-20 ENCOUNTER — Other Ambulatory Visit (HOSPITAL_COMMUNITY)
Admission: RE | Admit: 2018-12-20 | Discharge: 2018-12-20 | Disposition: A | Discharge status: Home / Self Care | Payer: BLUE CROSS/BLUE SHIELD | Source: Ambulatory Visit | Attending: Maternal Newborn | Admitting: Maternal Newborn

## 2018-12-20 VITALS — BP 118/74 | Ht 67.0 in | Wt 148.0 lb

## 2018-12-20 DIAGNOSIS — B3731 Acute candidiasis of vulva and vagina: Secondary | ICD-10-CM

## 2018-12-20 DIAGNOSIS — B373 Candidiasis of vulva and vagina: Secondary | ICD-10-CM

## 2018-12-20 DIAGNOSIS — N898 Other specified noninflammatory disorders of vagina: Secondary | ICD-10-CM | POA: Diagnosis not present

## 2018-12-20 DIAGNOSIS — R300 Dysuria: Secondary | ICD-10-CM | POA: Diagnosis not present

## 2018-12-20 LAB — POCT URINALYSIS DIPSTICK
Bilirubin, UA: NEGATIVE
Glucose, UA: NEGATIVE
Ketones, UA: NEGATIVE
LEUKOCYTES UA: NEGATIVE
NITRITE UA: NEGATIVE
PH UA: 7.5 (ref 5.0–8.0)
PROTEIN UA: NEGATIVE
RBC UA: NEGATIVE
Spec Grav, UA: 1.02 (ref 1.010–1.025)
UROBILINOGEN UA: NEGATIVE U/dL — AB

## 2018-12-20 MED ORDER — FLUCONAZOLE 150 MG PO TABS: 150.0000 mg | ORAL_TABLET | Freq: Once | ORAL | 0 refills | Status: AC

## 2018-12-20 NOTE — Progress Notes (Signed)
Obstetrics & Gynecology Office Visit   Chief Complaint:  Chief Complaint  Patient presents with  . Vaginitis  . Urinary Tract Infection    History of Present Illness: She presents with a one week history of urgency and bladder spasms. Last night, she began to experience dysuria and took some Azo. She has been drinking a lot of water. She has also noticed an abnormal vaginal discharge. It is a moderate amount that is thicker than usual, white in color, and sticky in consistency. It does not have an odor.  Review of Systems: Review of systems negative unless otherwise noted in HPI.  Past Medical History:  Past Medical History:  Diagnosis Date  . Anxiety   . 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 Depression   . Seizures ([place])    last seizure 5 years ago    Past Surgical History:  Past Surgical History:  Procedure Laterality Date  . BREAST SURGERY    . NO PAST SURGERIES      Gynecologic History: No LMP recorded.  Obstetric History: [id number]  Family History:  History reviewed. No pertinent family history.  Social History:  Social History   Socioeconomic History  . Marital status: Single    Spouse name: Not on file  . Number of children: Not on file  . Years of education: Not on file  . Highest education level: Not on file  Occupational History  . Not on file  Social Needs  . Financial resource strain: Not on file  . Food insecurity:    Worry: Not on file    Inability: Not on file  . Transportation needs:    Medical: Not on file    Non-medical: Not on file  Tobacco Use  . Smoking status: Never Smoker  . Smokeless tobacco: Never Used  Substance and Sexual Activity  . Alcohol use: Yes  . Drug use: No  . Sexual activity: Yes    Comment: partner had vasectomy  Lifestyle  . Physical activity:    Days per week: Not on file    Minutes per session: Not on file  . Stress: Not on file  Relationships  . Social connections:    Talks on phone: Not on file    Gets  together: Not on file    Attends religious service: Not on file    Active member of club or organization: Not on file    Attends meetings of clubs or organizations: Not on file    Relationship status: Not on file  . Intimate partner violence:    Fear of current or ex partner: Not on file    Emotionally abused: Not on file    Physically abused: Not on file    Forced sexual activity: Not on file  Other Topics Concern  . Not on file  Social History Narrative  . Not on file    Allergies:  No Known Allergies  Medications: Prior to Admission medications   Medication Sig Start Date End Date Taking? Authorizing Provider  aspirin-acetaminophen-caffeine (EXCEDRIN MIGRAINE) [phone] MG tablet Take 2 tablets by mouth every 6 (six) hours as needed for headache.   Yes [provider]  ibuprofen (ADVIL,MOTRIN) 600 MG tablet Take 1 tablet (600 mg total) by mouth every 8 (eight) hours as needed. 08/04/18  Yes Paulette Blanch, MD  Multiple Vitamin (MULTIVITAMIN WITH MINERALS) TABS tablet Take 1 tablet by mouth daily.   Yes [provider]  sertraline (ZOLOFT) 25 MG tablet  Take 2 tablets (50 mg total) by mouth daily. 09/04/15  Yes Clapacs, Madie Reno, MD  HYDROcodone-acetaminophen (NORCO) 5-325 MG tablet Take 1 tablet by mouth every 6 (six) hours as needed for moderate pain. Patient not taking: Reported on 12/20/2018 08/04/18   Paulette Blanch, MD  MICROGESTIN 1-20 MG-MCG tablet Take 1 tablet by mouth daily. 11/08/18   [provider]    Physical Exam Vitals:  Vitals:   12/20/18 1455  BP: 118/74   No LMP recorded.  General: NAD HEENT: normocephalic, anicteric Pulmonary: No increased work of breathing Genitourinary:  External: Normal external female genitalia.  Normal urethral meatus, normal  Bartholin's and Skene's glands.    Vagina: Normal vaginal mucosa, no evidence of prolapse.   Neurologic: Grossly intact Psychiatric: mood appropriate, affect full  UA negative for blood,  nitrites, leukocytes. Wet prep positive for yeast, negative for clue cells, negative whiff.  Assessment: 33 y.o. [id number] with a yeast infection, possible UTI.  Plan: Problem List Items Addressed This Visit    None    Visit Diagnoses    Dysuria    -  Primary   Relevant Orders   POCT Urinalysis Dipstick (Completed)   Urine Culture (Completed)   Vaginal discharge       Relevant Orders   Cervicovaginal ancillary only   Vulvovaginal candidiasis         1) UA negative, but urine culture sent as she has symptoms of a UTI.  2) Yeast positive on wet prep, Rx sent for Diflucan. Swab sent to rule out concurrent BV infection. Declines STI screening.   Avel Sensor, CNM 12/20/2018

## 2018-12-21 ENCOUNTER — Telehealth: Payer: Self-pay

## 2018-12-21 NOTE — Telephone Encounter (Signed)
That is fine, if you can generate one for her and leave it at the front that would be great. If you need me to sign I can do that.

## 2018-12-21 NOTE — Telephone Encounter (Signed)
Pt needs note for work for appt yesterday and being out today d/t pain.  [phone]

## 2018-12-22 LAB — URINE CULTURE

## 2018-12-23 ENCOUNTER — Encounter: Payer: Self-pay | Admitting: Maternal Newborn

## 2018-12-24 LAB — CERVICOVAGINAL ANCILLARY ONLY: BACTERIAL VAGINITIS: NEGATIVE

## 2018-12-24 NOTE — Telephone Encounter (Signed)
Pt aware work note is up front for her to p/u.

## 2019-08-19 ENCOUNTER — Other Ambulatory Visit: Payer: Self-pay

## 2019-08-19 DIAGNOSIS — Z20822 Contact with and (suspected) exposure to covid-19: Secondary | ICD-10-CM

## 2019-08-21 LAB — NOVEL CORONAVIRUS, NAA: SARS-CoV-2, NAA: NOT DETECTED

## 2019-09-23 ENCOUNTER — Other Ambulatory Visit: Payer: Self-pay | Admitting: Obstetrics & Gynecology

## 2019-09-23 MED ORDER — VALACYCLOVIR HCL 500 MG PO TABS: 500.0000 mg | ORAL_TABLET | Freq: Every day | ORAL | 0 refills | Status: DC

## 2019-09-23 NOTE — Telephone Encounter (Signed)
Patient is schedule for annual 10/17/19

## 2019-09-23 NOTE — Telephone Encounter (Signed)
Sch annual w PH.  Let her know Rx refilled for medicine she requested until then.  She is due for PAP and exam.

## 2019-10-15 ENCOUNTER — Other Ambulatory Visit: Payer: Self-pay | Admitting: Maternal Newborn

## 2019-10-15 MED ORDER — VALACYCLOVIR HCL 1 G PO TABS: 1000.0000 mg | ORAL_TABLET | Freq: Every day | ORAL | 0 refills | Status: DC

## 2019-10-17 ENCOUNTER — Ambulatory Visit: Payer: BLUE CROSS/BLUE SHIELD | Admitting: Obstetrics & Gynecology

## 2019-11-12 ENCOUNTER — Other Ambulatory Visit: Payer: Self-pay | Admitting: Obstetrics & Gynecology

## 2019-11-12 MED ORDER — VALACYCLOVIR HCL 1 G PO TABS: 1000.0000 mg | ORAL_TABLET | Freq: Every day | ORAL | 0 refills | Status: DC

## 2019-11-12 NOTE — Telephone Encounter (Signed)
Please advise 

## 2019-11-18 ENCOUNTER — Encounter: Payer: Self-pay | Admitting: Intensive Care

## 2019-11-18 ENCOUNTER — Other Ambulatory Visit: Payer: Self-pay

## 2019-11-18 ENCOUNTER — Emergency Department
Admission: EM | Admit: 2019-11-18 | Discharge: 2019-11-18 | Disposition: A | Discharge status: Home / Self Care | Payer: Self-pay | Attending: Emergency Medicine | Admitting: Emergency Medicine

## 2019-11-18 DIAGNOSIS — R070 Pain in throat: Secondary | ICD-10-CM | POA: Insufficient documentation

## 2019-11-18 DIAGNOSIS — J069 Acute upper respiratory infection, unspecified: Secondary | ICD-10-CM | POA: Insufficient documentation

## 2019-11-18 DIAGNOSIS — Z20828 Contact with and (suspected) exposure to other viral communicable diseases: Secondary | ICD-10-CM | POA: Insufficient documentation

## 2019-11-18 DIAGNOSIS — Z20822 Contact with and (suspected) exposure to covid-19: Secondary | ICD-10-CM

## 2019-11-18 LAB — INFLUENZA PANEL BY PCR (TYPE A & B)
Influenza A By PCR: NEGATIVE
Influenza B By PCR: NEGATIVE

## 2019-11-18 LAB — SARS CORONAVIRUS 2 (TAT 6-24 HRS): SARS Coronavirus 2: NEGATIVE

## 2019-11-18 NOTE — ED Provider Notes (Signed)
[facility] Emergency Department Provider Note ____________________________________________  Time seen: 1158  I have reviewed the triage vital signs and the nursing notes.  HISTORY  Chief Complaint  Fever and Sore Throat   HPI Latoya Luna is a 33 y.o. female presents SL to the ED for evaluation of a possible exposure to Covid.  Patient describes a coworker with whom she had close contact, recently tested positive.  Patient describes since last week she has had intermittent fevers with a T-max of 101 F.  She is also had some fatigue, sore throat, and  body aches.  She denies any loss taste/smell sensation, chest pain, shortness of breath.  She presents now for Covid testing.  Past Medical History:  Diagnosis Date  . Anxiety   . 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 Depression   . Seizures ([place])    last seizure 5 years ago    Patient Active Problem List   Diagnosis Date Noted  . Labor and delivery, indication for care 04/09/2015  . Indication for care or intervention related to labor and delivery 04/09/2015  . Vaginal delivery 04/09/2015  . 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 Episode of syncope 11/04/2014  . 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 BREAST SURGERY    . NO PAST SURGERIES      Prior to Admission medications   Medication Sig Start Date End Date Taking? Authorizing Provider  aspirin-acetaminophen-caffeine (EXCEDRIN MIGRAINE) [phone] MG tablet Take 2 tablets by mouth every 6 (six) hours as needed for headache.    [provider]  MICROGESTIN 1-20 MG-MCG tablet Take 1 tablet by mouth daily. 11/08/18   [provider]  Multiple Vitamin (MULTIVITAMIN WITH MINERALS) TABS tablet Take 1 tablet by mouth daily.    [provider]  sertraline (ZOLOFT) 25 MG tablet Take 2 tablets (50 mg total) by mouth daily. 09/04/15   Clapacs, Madie Reno, MD  valACYclovir (VALTREX) 1000 MG  tablet Take 1 tablet (1,000 mg total) by mouth daily. 11/12/19 12/12/19  Gae Dry, MD    Allergies Patient has no known allergies.  History reviewed. No pertinent family history.  Social History Social History   Tobacco Use  . Smoking status: Never Smoker  . Smokeless tobacco: Never Used  Substance Use Topics  . Alcohol use: Yes    Comment: occ  . Drug use: No    Review of Systems  Constitutional: Positive for fever. Eyes: Negative for visual changes. ENT: Negative for sore throat. Cardiovascular: Negative for chest pain. Respiratory: Negative for shortness of breath. Gastrointestinal: Negative for abdominal pain, vomiting and diarrhea. Genitourinary: Negative for dysuria. Musculoskeletal: Negative for back pain. Skin: Negative for rash. Neurological: Negative for headaches, focal weakness or numbness. ____________________________________________  PHYSICAL EXAM:  VITAL SIGNS: ED Triage Vitals [11/18/19 1153]  Enc Vitals Group     BP      Pulse      Resp      Temp      Temp src      SpO2      Weight 158 lb (71.7 kg)     Height 5\' 7"  (1.702 m)     Head Circumference      Peak Flow      Pain Score 6     Pain Loc      Pain Edu?      Excl. in [place]?     Constitutional: Alert  and oriented. Well appearing and in no distress. Head: Normocephalic and atraumatic. Eyes: Conjunctivae are normal. Normal extraocular movements Cardiovascular: Normal rate, regular rhythm. Normal distal pulses. Respiratory: Normal respiratory effort. No wheezes/rales/rhonchi. Musculoskeletal: Nontender with normal range of motion in all extremities.  Neurologic:  Normal gait without ataxia. Normal speech and language. No gross focal neurologic deficits are appreciated. Skin:  Skin is warm, dry and intact. No rash noted. Psychiatric: Mood and affect are normal. Patient exhibits appropriate insight and judgment. ____________________________________________   LABS (pertinent  positives/negatives) Labs Reviewed  SARS CORONAVIRUS 2 (TAT 6-24 HRS)  INFLUENZA PANEL BY PCR (TYPE A & B)  ____________________________________________  PROCEDURES  Procedures ____________________________________________  INITIAL IMPRESSION / ASSESSMENT AND PLAN / ED COURSE  DDX: URI, influenza, COVID, CAP  Patient with a ED evaluation of symptoms concerning for possible Covid exposure.  Patient describes she was exposed to a coworker who recently tested positive.  Patient symptoms began last week.  She presents now for further testing.  Otherwise exam is benign and vital signs are stable.  She will be discharged with instructions to remain under house quarantine until results are available.  Patient will follow up with primary provider and treat symptoms as necessary.  Latoya Luna was evaluated in Emergency Department on 11/18/2019 for the symptoms described in the history of present illness. She was evaluated in the context of the global COVID-19 pandemic, which necessitated consideration that the patient might be at risk for infection with the SARS-CoV-2 virus that causes COVID-19. Institutional protocols and algorithms that pertain to the evaluation of patients at risk for COVID-19 are in a state of rapid change based on information released by regulatory bodies including the CDC and federal and state organizations. These policies and algorithms were followed during the patient's care in the ED. ____________________________________________  FINAL CLINICAL IMPRESSION(S) / ED DIAGNOSES  Final diagnoses:  Viral upper respiratory tract infection  Exposure to COVID-19 virus      [place], Dannielle Karvonen, PA-C 11/18/19 1223    Delman Kitten, MD 11/18/19 1308

## 2019-11-18 NOTE — Discharge Instructions (Addendum)
You are being tested for COVID and influenza. Remain under house quarantine until results are available. Treat symptoms with OTC meds, as needed. Follow-up with your provider or return to the ED as needed.

## 2019-11-18 NOTE — ED Notes (Addendum)
See triage note  Presents with sore throat and fatigue for the past few days  States she was exposed to someone that tested positive on Friday.  Afebrile on arrival

## 2019-11-18 NOTE — ED Triage Notes (Signed)
Pt c/o fever and sore throat for a few days. Reports coming into contact with someone COVID postive

## 2019-12-12 ENCOUNTER — Other Ambulatory Visit: Payer: Self-pay

## 2019-12-12 MED ORDER — VALACYCLOVIR HCL 1 G PO TABS: 1000.0000 mg | ORAL_TABLET | Freq: Every day | ORAL | 0 refills | Status: AC

## 2019-12-13 ENCOUNTER — Other Ambulatory Visit: Payer: Self-pay

## 2019-12-13 ENCOUNTER — Ambulatory Visit: Payer: Self-pay | Admitting: Obstetrics & Gynecology

## 2019-12-26 ENCOUNTER — Encounter: Payer: Self-pay | Admitting: Emergency Medicine

## 2019-12-26 ENCOUNTER — Emergency Department
Admission: EM | Admit: 2019-12-26 | Discharge: 2019-12-26 | Disposition: A | Discharge status: Home / Self Care | Payer: Self-pay | Attending: Emergency Medicine | Admitting: Emergency Medicine

## 2019-12-26 ENCOUNTER — Other Ambulatory Visit: Payer: Self-pay

## 2019-12-26 DIAGNOSIS — B349 Viral infection, unspecified: Secondary | ICD-10-CM | POA: Insufficient documentation

## 2019-12-26 DIAGNOSIS — Z79899 Other long term (current) drug therapy: Secondary | ICD-10-CM | POA: Insufficient documentation

## 2019-12-26 DIAGNOSIS — Z20822 Contact with and (suspected) exposure to covid-19: Secondary | ICD-10-CM | POA: Insufficient documentation

## 2019-12-26 NOTE — ED Notes (Signed)
See triage note  Presents with body aches ,sore throat and h/a  States she has been exposed to COVID  Afebrile on arrival

## 2019-12-26 NOTE — ED Triage Notes (Signed)
Pt here with positive covid exposure within the past week, here for sore throat, generalized body aches, and headache for a few days. Nad.

## 2019-12-26 NOTE — Discharge Instructions (Signed)
Take tylenol or ibuprofen for headache, sore throat, and fever.  Follow up with primary care or return to the ER for symptoms that change or worsen.

## 2019-12-26 NOTE — ED Provider Notes (Signed)
[facility] Emergency Department Provider Note  ____________________________________________  Time seen: Approximately 6:48 PM  I have reviewed the triage vital signs and the nursing notes.   HISTORY  Chief Complaint Headache, Sore Throat, and Generalized Body Aches   HPI Latoya Luna is a 34 y.o. female presents to the emergency department for treatment and evaluation  of body aches, sore throat, headache, and low grade fever for the past 3 days or so.  She has had a Covid exposure within the past week.   Past Medical History:  Diagnosis Date  . Anxiety   . 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 Depression   . Seizures ([place])    last seizure 5 years ago    Patient Active Problem List   Diagnosis Date Noted  . Labor and delivery, indication for care 04/09/2015  . Indication for care or intervention related to labor and delivery 04/09/2015  . Vaginal delivery 04/09/2015  . 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 Episode of syncope 11/04/2014  . 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 BREAST SURGERY    . NO PAST SURGERIES      Prior to Admission medications   Medication Sig Start Date End Date Taking? Authorizing Provider  aspirin-acetaminophen-caffeine (EXCEDRIN MIGRAINE) [phone] MG tablet Take 2 tablets by mouth every 6 (six) hours as needed for headache.    [provider]  MICROGESTIN 1-20 MG-MCG tablet Take 1 tablet by mouth daily. 11/08/18   [provider]  Multiple Vitamin (MULTIVITAMIN WITH MINERALS) TABS tablet Take 1 tablet by mouth daily.    [provider]  sertraline (ZOLOFT) 25 MG tablet Take 2 tablets (50 mg total) by mouth daily. 09/04/15   Clapacs, Madie Reno, MD  valACYclovir (VALTREX) 1000 MG tablet Take 1 tablet (1,000 mg total) by mouth daily. 12/12/19 01/11/20  Gae Dry, MD    Allergies Patient has no known allergies.  No family history on  file.  Social History Social History   Tobacco Use  . Smoking status: Never Smoker  . Smokeless tobacco: Never Used  Substance Use Topics  . Alcohol use: Yes    Comment: occ  . Drug use: No    Review of Systems Constitutional: Positive for fever/chills. Normal appetite. ENT: Positive for sore throat. Cardiovascular: Denies chest pain. Respiratory: Negative for shortness of breath. Negative for cough. no wheezing.  Gastrointestinal: Negative for nausea,  no vomiting.  Negative for diarrhea.  Musculoskeletal: Positive for body aches Skin: Negative for rash. Neurological: Positive for headaches ____________________________________________   PHYSICAL EXAM:  VITAL SIGNS: ED Triage Vitals  Enc Vitals Group     BP 12/26/19 1824 116/85     Pulse Rate 12/26/19 1846 78     Resp 12/26/19 1824 18     Temp 12/26/19 1824 98.8 F (37.1 C)     Temp Source 12/26/19 1824 Oral     SpO2 12/26/19 1824 99 %     Weight 12/26/19 1825 155 lb (70.3 kg)     Height 12/26/19 1825 5\' 7"  (1.702 m)     Head Circumference --      Peak Flow --      Pain Score 12/26/19 1824 5     Pain Loc --      Pain Edu? --      Excl. in [place]? --     Constitutional: Alert and oriented. Well appearing and in no acute  distress. Eyes: Conjunctivae are normal. Ears: TM normal Nose: No sinus congestion noted; no rhinnorhea. Mouth/Throat: Mucous membranes are moist.  Oropharynx erythematous. Tonsils flat. Uvula midline. Neck: No stridor.  Lymphatic: No cervical lymphadenopathy. Cardiovascular: Normal rate, regular rhythm. Good peripheral circulation. Respiratory: Respirations are even and unlabored.  No retractions. Breath sounds clear. Gastrointestinal: Soft and nontender.  Musculoskeletal: FROM x 4 extremities.  Neurologic:  Normal speech and language. Skin:  Skin is warm, dry and intact. No rash noted. Psychiatric: Mood and affect are normal. Speech and behavior are  normal.  ____________________________________________   LABS (all labs ordered are listed, but only abnormal results are displayed)  Labs Reviewed  SARS CORONAVIRUS 2 (TAT 6-24 HRS)   ____________________________________________  EKG  Not indicated. ____________________________________________  RADIOLOGY  Not indicated. ____________________________________________   PROCEDURES  Procedure(s) performed: None  Critical Care performed: No ____________________________________________   INITIAL IMPRESSION / ASSESSMENT AND PLAN / ED COURSE  34 y.o. female presents to the emergency department for treatment and evaluation of 2 to 3 days of viral symptoms.  See HPI for further details.  Patient has had a positive Covid exposure at work.  Differential diagnosis includes but is not limited to, COVID-19, influenza, URI  Patient was tested for COVID-19 and advised to treat her symptoms with Tylenol or ibuprofen.  Work note was provided.  She is to be out of work until she is 4 days symptom-free with no medications.  She was advised to follow-up with primary care or return to the emergency department for symptoms of concern.   BRADLEY VOORHEIS was evaluated in Emergency Department on 12/26/2019 for the symptoms described in the history of present illness. She was evaluated in the context of the global COVID-19 pandemic, which necessitated consideration that the patient might be at risk for infection with the SARS-CoV-2 virus that causes COVID-19. Institutional protocols and algorithms that pertain to the evaluation of patients at risk for COVID-19 are in a state of rapid change based on information released by regulatory bodies including the CDC and federal and state organizations. These policies and algorithms were followed during the patient's care in the ED.  Medications - No data to display  ED Discharge Orders    None       Pertinent labs & imaging results that were available  during my care of the patient were reviewed by me and considered in my medical decision making (see chart for details).    If controlled substance prescribed during this visit, 12 month history viewed on the [place] prior to issuing an initial prescription for Schedule II or III opiod. ____________________________________________   FINAL CLINICAL IMPRESSION(S) / ED DIAGNOSES  Final diagnoses:  Viral illness  Close exposure to COVID-19 virus    Note:  This document was prepared using Dragon voice recognition software and may include unintentional dictation errors.    Victorino Dike, FNP 12/26/19 2005    Carrie Mew, MD 12/26/19 2356

## 2019-12-27 LAB — SARS CORONAVIRUS 2 (TAT 6-24 HRS): SARS Coronavirus 2: NEGATIVE

## 2019-12-30 ENCOUNTER — Other Ambulatory Visit: Payer: Self-pay

## 2019-12-30 ENCOUNTER — Emergency Department
Admission: EM | Admit: 2019-12-30 | Discharge: 2019-12-30 | Disposition: A | Discharge status: Home / Self Care | Payer: Self-pay | Attending: Emergency Medicine | Admitting: Emergency Medicine

## 2019-12-30 ENCOUNTER — Encounter: Payer: Self-pay | Admitting: Emergency Medicine

## 2019-12-30 DIAGNOSIS — Z79899 Other long term (current) drug therapy: Secondary | ICD-10-CM | POA: Insufficient documentation

## 2019-12-30 DIAGNOSIS — N760 Acute vaginitis: Secondary | ICD-10-CM

## 2019-12-30 DIAGNOSIS — B9689 Other specified bacterial agents as the cause of diseases classified elsewhere: Secondary | ICD-10-CM | POA: Insufficient documentation

## 2019-12-30 DIAGNOSIS — Z20822 Contact with and (suspected) exposure to covid-19: Secondary | ICD-10-CM

## 2019-12-30 LAB — WET PREP, GENITAL
Sperm: NONE SEEN
Trich, Wet Prep: NONE SEEN
Yeast Wet Prep HPF POC: NONE SEEN

## 2019-12-30 LAB — URINALYSIS, COMPLETE (UACMP) WITH MICROSCOPIC
Bacteria, UA: NONE SEEN
Bilirubin Urine: NEGATIVE
Glucose, UA: NEGATIVE mg/dL
Hgb urine dipstick: NEGATIVE
Ketones, ur: NEGATIVE mg/dL
Leukocytes,Ua: NEGATIVE
Nitrite: NEGATIVE
Protein, ur: NEGATIVE mg/dL
Specific Gravity, Urine: 1.002 — ABNORMAL LOW (ref 1.005–1.030)
pH: 7 (ref 5.0–8.0)

## 2019-12-30 LAB — POCT PREGNANCY, URINE: Preg Test, Ur: NEGATIVE

## 2019-12-30 MED ORDER — FLUCONAZOLE 150 MG PO TABS: ORAL_TABLET | ORAL | 0 refills | Status: DC

## 2019-12-30 MED ORDER — METRONIDAZOLE 500 MG PO TABS: 500.0000 mg | ORAL_TABLET | Freq: Two times a day (BID) | ORAL | 0 refills | Status: DC

## 2019-12-30 NOTE — Discharge Instructions (Signed)
Follow-up with your primary care provider if any continued problems.  The medication that was sent to your pharmacy.  Take all of the Flagyl until completely gone.  There is one Diflucan if needed for yeast infection.  Continue to drink fluids and stay hydrated.  Your Covid test was done today and you need to quarantine until you have received those results.  You can look on the MyChart and see your results first thing tomorrow.  If your test results is positive you will need to quarantine an additional 10 days.  Should anyone else in your family or friends need to be tested the phone number for Covid testing in [facility] is [phone]

## 2019-12-30 NOTE — ED Triage Notes (Signed)
Presents with urinary freq   Also states she has been exposed to [place]  Was seen for same last week  But test was negative

## 2019-12-30 NOTE — ED Provider Notes (Addendum)
[facility] Emergency Department Provider Note  ____________________________________________   First MD Initiated Contact with Patient 12/30/19 1136     (approximate)  I have reviewed the triage vital signs and the nursing notes.   HISTORY  Chief Complaint No chief complaint on file.   HPI Latoya Luna is a 34 y.o. female presents to the ED with complaint of urinary frequency.  She states that she drinks water frequently during the day.  Patient denies any burning but states that there is an odor that she is unsure if it is urine or vaginal.  Patient also states she was exposed to Covid by her boss.  Patient had a Covid test done 4 days ago which was reported as negative.  She states she continues to feel achy.  She states that her boss wants her to have a another negative test before coming back to work.      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 Authorizing Provider  aspirin-acetaminophen-caffeine (EXCEDRIN MIGRAINE) [phone] MG tablet Take 2 tablets by mouth every 6 (six) hours as needed for headache.    [provider]  fluconazole (DIFLUCAN) 150 MG tablet 1 tablet by mouth if needed for yeast infection 12/30/19   Letitia Neri L, PA-C  metroNIDAZOLE (FLAGYL) 500 MG tablet Take 1 tablet (500 mg total) by mouth 2 (two) times daily.  12/30/19   Letitia Neri L, PA-C  MICROGESTIN 1-20 MG-MCG tablet Take 1 tablet by mouth daily. 11/08/18   [provider]  Multiple Vitamin (MULTIVITAMIN WITH MINERALS) TABS tablet Take 1 tablet by mouth daily.    [provider]  sertraline (ZOLOFT) 25 MG tablet Take 2 tablets (50 mg total) by mouth daily. 09/04/15   Clapacs, Madie Reno, MD  valACYclovir (VALTREX) 1000 MG tablet Take 1 tablet (1,000 mg total) by mouth daily. 12/12/19 01/11/20  Gae Dry, MD    Allergies Patient has no known allergies.  No family history on file.  Social History Social History   Tobacco Use   Smoking status: Never Smoker   Smokeless tobacco: Never Used  Substance Use Topics   Alcohol use: Yes    Comment: occ   Drug use: No    Review of Systems Constitutional: No fever/chills Cardiovascular: Denies chest pain. Respiratory: Denies shortness of breath. Gastrointestinal: No abdominal pain.  No nausea, no vomiting.  No diarrhea.   Genitourinary: Positive urinary frequency.  Positive history of BV. Musculoskeletal: Negative for back pain. Skin: Negative for rash. Neurological: Negative for headaches, focal weakness or numbness. ____________________________________________   PHYSICAL EXAM:  VITAL SIGNS: ED Triage Vitals  Enc Vitals Group     BP 12/30/19 1113 106/69     Pulse Rate 12/30/19 1113 95     Resp 12/30/19  1113 20     Temp 12/30/19 1113 99.5 F (37.5 C)     Temp Source 12/30/19 1113 Oral     SpO2 12/30/19 1113 95 %     Weight 12/30/19 1117 155 lb (70.3 kg)     Height 12/30/19 1117 5\' 7"  (1.702 m)     Head Circumference --      Peak Flow --      Pain Score 12/30/19 1117 5     Pain Loc --      Pain Edu? --      Excl. in [place]? --     Constitutional: Alert and oriented. Well appearing and in no acute distress. Eyes: Conjunctivae are normal.  Head: Atraumatic. Neck: No stridor.   Cardiovascular: Normal rate, regular rhythm. Grossly normal heart sounds.   Good peripheral circulation. Respiratory: Normal respiratory effort.  No retractions. Lungs CTAB. Gastrointestinal: Soft and nontender. No distention.  Genitourinary: External genitalia is unremarkable.  There is some white secretions noted along the vaginal walls.  Minimal tenderness on bimanual exam and no adnexal masses or tenderness appreciated. Musculoskeletal: Moves upper and lower extremities with any difficulty.  No edema noted lower extremities.  Normal gait. Neurologic:  Normal speech and language. No gross focal neurologic deficits are appreciated. No gait instability. Skin:  Skin is warm, dry and intact. No rash noted. Psychiatric: Mood and affect are normal. Speech and behavior are normal.  ____________________________________________   LABS (all labs ordered are listed, but only abnormal results are displayed)  Labs Reviewed  WET PREP, GENITAL - Abnormal; Notable for the following components:      Result Value   Clue Cells Wet Prep HPF POC PRESENT (*)    WBC, Wet Prep HPF POC MODERATE (*)    All other components within normal limits  URINALYSIS, COMPLETE (UACMP) WITH MICROSCOPIC - Abnormal; Notable for the following components:   Color, Urine STRAW (*)    APPearance CLEAR (*)    Specific Gravity, Urine 1.002 (*)    All other components within normal limits  SARS CORONAVIRUS 2 (TAT 6-24 HRS)  POC URINE PREG, ED  POCT PREGNANCY, URINE    PROCEDURES  Procedure(s) performed (including Critical Care):  Procedures   ____________________________________________   INITIAL IMPRESSION / ASSESSMENT AND PLAN / ED COURSE  As part of my medical decision making, I reviewed the following data within the electronic MEDICAL RECORD NUMBER Notes from prior ED visits and [place] Controlled Substance Database  [name] was evaluated in Emergency Department on 12/30/2019 for the symptoms described in the history of present illness. She was evaluated in the context of the global COVID-19  pandemic, which necessitated consideration that the patient might be at risk for infection with the SARS-CoV-2 virus that causes COVID-19. Institutional protocols and algorithms that pertain to the evaluation of patients at risk for COVID-19 are in a state of rapid change based on information released by regulatory bodies including the CDC and federal and state organizations. These policies and algorithms were followed during the patient's care in the ED.  34 year old female presents to the ED with complaint of urinary frequency and an odor.  She also is here to have another Covid test done as her boss will not let her return to work until she has a second negative Covid test.  Urinalysis was unremarkable.  Pelvic exam and wet prep was consistent with bacterial vaginosis.  Patient was made aware and a prescription for Flagyl was sent to her pharmacy along  with Diflucan if needed for yeast infection.  Patient had a Covid test done while in the ED.  She also was given the phone number for the Covid testing site and [facility] should she need continued Covid testing or if anyone in her family needs it.  She is aware that she can obtain her information through my chart and about quarantine until this results have been received.   ____________________________________________   FINAL CLINICAL IMPRESSION(S) / ED DIAGNOSES  Final diagnoses:  Bacterial vaginosis  Exposure to COVID-19 virus     ED Discharge Orders         Ordered    metroNIDAZOLE (FLAGYL) 500 MG tablet  2 times daily     12/30/19 1315    fluconazole (DIFLUCAN) 150 MG tablet     12/30/19 1315           Note:  This document was prepared using Dragon voice recognition software and may include unintentional dictation errors.    Johnn Hai, PA-C 12/30/19 [place], PA-C 12/30/19 1351    Vanessa , MD 12/31/19 1051

## 2019-12-31 LAB — SARS CORONAVIRUS 2 (TAT 6-24 HRS): SARS Coronavirus 2: NEGATIVE

## 2020-01-07 ENCOUNTER — Emergency Department: Payer: PRIVATE HEALTH INSURANCE

## 2020-01-07 ENCOUNTER — Other Ambulatory Visit: Payer: Self-pay

## 2020-01-07 ENCOUNTER — Ambulatory Visit: Payer: PRIVATE HEALTH INSURANCE | Attending: Internal Medicine

## 2020-01-07 ENCOUNTER — Emergency Department
Admission: EM | Admit: 2020-01-07 | Discharge: 2020-01-07 | Disposition: A | Discharge status: Home / Self Care | Payer: PRIVATE HEALTH INSURANCE | Attending: Emergency Medicine | Admitting: Emergency Medicine

## 2020-01-07 ENCOUNTER — Encounter: Payer: Self-pay | Admitting: Emergency Medicine

## 2020-01-07 DIAGNOSIS — J209 Acute bronchitis, unspecified: Secondary | ICD-10-CM | POA: Insufficient documentation

## 2020-01-07 DIAGNOSIS — Z20822 Contact with and (suspected) exposure to covid-19: Secondary | ICD-10-CM | POA: Insufficient documentation

## 2020-01-07 DIAGNOSIS — J40 Bronchitis, not specified as acute or chronic: Secondary | ICD-10-CM

## 2020-01-07 MED ORDER — FLUCONAZOLE 150 MG PO TABS: ORAL_TABLET | ORAL | 0 refills | Status: DC

## 2020-01-07 MED ORDER — PREDNISONE 10 MG PO TABS: ORAL_TABLET | ORAL | 0 refills | Status: DC

## 2020-01-07 MED ORDER — AZITHROMYCIN 250 MG PO TABS: ORAL_TABLET | ORAL | 0 refills | Status: DC

## 2020-01-07 MED ORDER — BENZONATATE 100 MG PO CAPS: 100.0000 mg | ORAL_CAPSULE | Freq: Three times a day (TID) | ORAL | 0 refills | Status: DC | PRN

## 2020-01-07 NOTE — ED Notes (Signed)
Pt to xray with a steady gait. No signs of respiratory distress at this time. Daughter left with registration clerk as she could not go to xray with mother.

## 2020-01-07 NOTE — ED Provider Notes (Signed)
[facility] Emergency Department Provider Note  ____________________________________________  Time seen: Approximately 6:57 PM  I have reviewed the triage vital signs and the nursing notes.   HISTORY  Chief Complaint Cough    HPI Latoya Luna is a 34 y.o. female that presents to the emergency department for evaluation of productive cough with green and brown sputum for 1 week.  Patient states that she has had mild symptoms for 2 weeks.  1 week ago, her cough got worse.  She has shortness of breath with severe coughing fits.  She tested negative for Covid twice over 1 week ago.  Patient states that her boss told her that she needed to be evaluated before returning to work.   Past Medical History:  Diagnosis Date  . Anxiety   . 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 Depression   . Seizures ([place])    last seizure 5 years ago    Patient Active Problem List   Diagnosis Date Noted  . Labor and delivery, indication for care 04/09/2015  . Indication for care or intervention related to labor and delivery 04/09/2015  . Vaginal delivery 04/09/2015  . 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 Episode of syncope 11/04/2014  . 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 BREAST SURGERY    . NO PAST SURGERIES      Prior to Admission medications   Medication Sig Start Date End Date Taking? Authorizing Provider  aspirin-acetaminophen-caffeine (EXCEDRIN MIGRAINE) [phone] MG tablet Take 2 tablets by mouth every 6 (six) hours as needed for headache.    [provider]  azithromycin (ZITHROMAX Z-PAK) 250 MG tablet Take 2 tablets (500 mg) on  Day 1,  followed by 1 tablet (250 mg) once daily on Days 2 through 5. 01/07/20   Laban Emperor, PA-C  benzonatate (TESSALON PERLES) 100 MG capsule Take 1 capsule (100 mg total) by mouth 3 (three) times daily as needed. 01/07/20 01/06/21  Laban Emperor, PA-C  fluconazole (DIFLUCAN) 150 MG  tablet 1 tablet by mouth if needed for yeast infection 01/07/20   Laban Emperor, PA-C  metroNIDAZOLE (FLAGYL) 500 MG tablet Take 1 tablet (500 mg total) by mouth 2 (two) times daily. 12/30/19   Letitia Neri L, PA-C  MICROGESTIN 1-20 MG-MCG tablet Take 1 tablet by mouth daily. 11/08/18   [provider]  Multiple Vitamin (MULTIVITAMIN WITH MINERALS) TABS tablet Take 1 tablet by mouth daily.    [provider]  predniSONE (DELTASONE) 10 MG tablet Take 6 tablets on day 1, take 5 tablets on day 2, take 4 tablets on day 3, take 3 tablets on day 4, take 2 tablets on day 5, take 1 tablet on day 6 01/07/20   Laban Emperor, PA-C  sertraline (ZOLOFT) 25 MG tablet Take 2 tablets (50 mg total) by mouth daily. 09/04/15   Clapacs, Madie Reno, MD  valACYclovir (VALTREX) 1000 MG tablet Take 1 tablet (1,000 mg total) by mouth daily. 12/12/19 01/11/20  Gae Dry, MD    Allergies Patient has no known allergies.  History reviewed. No pertinent family history.  Social History Social History   Tobacco Use  . Smoking status: Never Smoker  . Smokeless tobacco: Never Used  Substance Use Topics  . Alcohol use: Yes    Comment: occ  . Drug use: No     Review of Systems  Constitutional: No chills Eyes: No visual changes. No discharge. ENT: Negative  for congestion and rhinorrhea. Cardiovascular: No chest pain. Respiratory: Positive for cough. SOB with coughing. Gastrointestinal: No abdominal pain.  No nausea, no vomiting.  No diarrhea.  No constipation. Musculoskeletal: Negative for musculoskeletal pain. Skin: Negative for rash, abrasions, lacerations, ecchymosis. Neurological: Negative for headaches.   ____________________________________________   PHYSICAL EXAM:  VITAL SIGNS: ED Triage Vitals  Enc Vitals Group     BP 01/07/20 1814 128/79     Pulse Rate 01/07/20 1814 95     Resp 01/07/20 1814 18     Temp 01/07/20 1814 98.9 F (37.2 C)     Temp Source 01/07/20 1814 Oral      SpO2 01/07/20 1814 99 %     Weight 01/07/20 1810 155 lb (70.3 kg)     Height 01/07/20 1810 5\' 7"  (1.702 m)     Head Circumference --      Peak Flow --      Pain Score 01/07/20 1810 7     Pain Loc --      Pain Edu? --      Excl. in [place]? --      Constitutional: Alert and oriented. Well appearing and in no acute distress. Eyes: Conjunctivae are normal. PERRL. EOMI. No discharge. Head: Atraumatic. ENT: No frontal and maxillary sinus tenderness.      Ears: Tympanic membranes pearly gray with good landmarks. No discharge.      Nose: No congestion/rhinnorhea.      Mouth/Throat: Mucous membranes are moist. Oropharynx non-erythematous. Tonsils not enlarged. No exudates. Uvula midline. Neck: No stridor.   Hematological/Lymphatic/Immunilogical: No cervical lymphadenopathy. Cardiovascular: Normal rate, regular rhythm.  Good peripheral circulation. Respiratory: Normal respiratory effort without tachypnea or retractions. Lungs CTAB. Good air entry to the bases with no decreased or absent breath sounds. Gastrointestinal: Bowel sounds 4 quadrants. Soft and nontender to palpation. No guarding or rigidity. No palpable masses. No distention. Musculoskeletal: Full range of motion to all extremities. No gross deformities appreciated. Neurologic:  Normal speech and language. No gross focal neurologic deficits are appreciated.  Skin:  Skin is warm, dry and intact. No rash noted. Psychiatric: Mood and affect are normal. Speech and behavior are normal. Patient exhibits appropriate insight and judgement.   ____________________________________________   LABS (all labs ordered are listed, but only abnormal results are displayed)  Labs Reviewed  SARS CORONAVIRUS 2 (TAT 6-24 HRS)   ____________________________________________  EKG   ____________________________________________  RADIOLOGY Robinette Haines, personally viewed and evaluated these images (plain radiographs) as part of my medical decision  making, as well as reviewing the written report by the radiologist.  DG Chest 2 View  Result Date: 01/07/2020 CLINICAL DATA:  Cough EXAM: CHEST - 2 VIEW COMPARISON:  10/21/2015 FINDINGS: The heart size and mediastinal contours are within normal limits. Both lungs are clear. The visualized skeletal structures are unremarkable. IMPRESSION: Normal study. Electronically Signed   By: Rolm Baptise M.D.   On: 01/07/2020 18:29    ____________________________________________    PROCEDURES  Procedure(s) performed:    Procedures    Medications - No data to display   ____________________________________________   INITIAL IMPRESSION / ASSESSMENT AND PLAN / ED COURSE  Pertinent labs & imaging results that were available during my care of the patient were reviewed by me and considered in my medical decision making (see chart for details).  Review of the [place] CSRS was performed in accordance of the [place] prior to dispensing any controlled drugs.   Patient's diagnosis is consistent with bronchitis. Vital signs  and exam are reassuring.  Chest x-ray negative for acute cardiopulmonary process.  Patient appears well and is staying well hydrated. Patient feels comfortable going home. Patient will be discharged home with prescriptions for prednisone, azithromycin, and tessalon perles.  Patient requests a prescription for Diflucan as she regularly gets yeast infections following antibiotics.  We discussed that azithromycin usually does not give women trouble with yeast infections and to only use the Diflucan if she develops symptoms.  Patient is to follow up with PCP as needed or otherwise directed. Patient is given ED precautions to return to the ED for any worsening or new symptoms.  SHARONANN POLLICK was evaluated in Emergency Department on 01/07/2020 for the symptoms described in the history of present illness. She was evaluated in the context of the global COVID-19 pandemic, which necessitated consideration  that the patient might be at risk for infection with the SARS-CoV-2 virus that causes COVID-19. Institutional protocols and algorithms that pertain to the evaluation of patients at risk for COVID-19 are in a state of rapid change based on information released by regulatory bodies including the CDC and federal and state organizations. These policies and algorithms were followed during the patient's care in the ED.   ____________________________________________  FINAL CLINICAL IMPRESSION(S) / ED DIAGNOSES  Final diagnoses:  Bronchitis      NEW MEDICATIONS STARTED DURING THIS VISIT:  ED Discharge Orders         Ordered    azithromycin (ZITHROMAX Z-PAK) 250 MG tablet     01/07/20 1923    predniSONE (DELTASONE) 10 MG tablet     01/07/20 1923    fluconazole (DIFLUCAN) 150 MG tablet     01/07/20 1945    benzonatate (TESSALON PERLES) 100 MG capsule  3 times daily PRN     01/07/20 1945              This chart was dictated using voice recognition software/Dragon. Despite best efforts to proofread, errors can occur which can change the meaning. Any change was purely unintentional.    Laban Emperor, PA-C 01/07/20 2020    Earleen Newport, MD 01/14/20 1309

## 2020-01-07 NOTE — ED Triage Notes (Addendum)
Pt started with sore throat 2 weeks ago.  Now c/o cough for 7-10 days. Has had 3 negative covid tests.  Has not had xray.  Pt reports it feels like bronchitis.  No fever. Unlabored at check in after ambulatory from parking lot.   Coughing up green sputum.

## 2020-01-08 LAB — NOVEL CORONAVIRUS, NAA: SARS-CoV-2, NAA: NOT DETECTED

## 2020-01-08 LAB — SARS CORONAVIRUS 2 (TAT 6-24 HRS): SARS Coronavirus 2: NEGATIVE

## 2020-01-14 ENCOUNTER — Other Ambulatory Visit: Payer: Self-pay | Admitting: Obstetrics & Gynecology

## 2020-01-14 NOTE — Telephone Encounter (Signed)
Called and left voice mail for patient to call back to be schedule °

## 2020-01-15 ENCOUNTER — Telehealth: Payer: Self-pay

## 2020-01-15 NOTE — Telephone Encounter (Signed)
Pt is calling to get a refill on Valtrex, I do not see on current medication list. Pt is scheduling an appt as soon as her insurance is activated first of march. Can you refill this please?

## 2020-01-15 NOTE — Telephone Encounter (Signed)
No, it has been many years since she has been seen or evaluated here; must have in person visit in order to continue to receive meds

## 2020-01-17 NOTE — Telephone Encounter (Signed)
Pt called checking on msg from earlier this week. Called her back, no answer, LVMTRC.

## 2020-01-21 NOTE — Telephone Encounter (Signed)
Patient is schedule for 3/16/21with [facility]. Patient is requesting refill to get to her appointment

## 2020-01-21 NOTE — Telephone Encounter (Signed)
Please advise. I told her we needed to see her BEFORE anything would be sent in, but she insisted I ask you once she got on your schedule. Ill let her know once I hear back from you. Thank you.

## 2020-01-21 NOTE — Telephone Encounter (Signed)
Called and left voice mail for patient to call back to be schedule °

## 2020-01-21 NOTE — Telephone Encounter (Signed)
Pt is upset, she cannot get in with [place] until her insurance is active in a few weeks. Requesting that I send a message to a provider once she had an appt on the books to see if a refill will be sent in. At least one refill. Can you call pt back to get an appt scheduled? Can be any provider. Please let me know and I will send to the provider she has an appt with to see if they will refill medication

## 2020-01-21 NOTE — Telephone Encounter (Signed)
Called pt this AM, no answer. Please let us know when/if she calls back

## 2020-01-22 NOTE — Telephone Encounter (Signed)
Please advise 

## 2020-01-23 ENCOUNTER — Other Ambulatory Visit: Payer: Self-pay | Admitting: Obstetrics & Gynecology

## 2020-01-23 MED ORDER — VALACYCLOVIR HCL 500 MG PO TABS: 500.0000 mg | ORAL_TABLET | Freq: Every day | ORAL | 0 refills | Status: DC

## 2020-01-23 NOTE — Telephone Encounter (Signed)
valtrex

## 2020-01-24 ENCOUNTER — Other Ambulatory Visit: Payer: Self-pay | Admitting: Obstetrics & Gynecology

## 2020-01-24 MED ORDER — VALACYCLOVIR HCL 1 G PO TABS: 1000.0000 mg | ORAL_TABLET | Freq: Every day | ORAL | 0 refills | Status: DC

## 2020-01-24 NOTE — Telephone Encounter (Signed)
Can you change to 1 gram?

## 2020-02-11 ENCOUNTER — Ambulatory Visit: Payer: Self-pay | Admitting: Obstetrics & Gynecology

## 2020-02-20 ENCOUNTER — Other Ambulatory Visit: Payer: Self-pay

## 2020-02-20 ENCOUNTER — Other Ambulatory Visit (HOSPITAL_COMMUNITY): Admission: RE | Admit: 2020-02-20 | Payer: Self-pay | Source: Ambulatory Visit

## 2020-02-20 ENCOUNTER — Ambulatory Visit (INDEPENDENT_AMBULATORY_CARE_PROVIDER_SITE_OTHER): Payer: Self-pay | Admitting: Advanced Practice Midwife

## 2020-02-20 VITALS — BP 118/74 | Ht 67.0 in | Wt 154.0 lb

## 2020-02-20 DIAGNOSIS — M5441 Lumbago with sciatica, right side: Secondary | ICD-10-CM

## 2020-02-20 DIAGNOSIS — Z01419 Encounter for gynecological examination (general) (routine) without abnormal findings: Secondary | ICD-10-CM

## 2020-02-20 DIAGNOSIS — Z124 Encounter for screening for malignant neoplasm of cervix: Secondary | ICD-10-CM

## 2020-02-20 DIAGNOSIS — Z3041 Encounter for surveillance of contraceptive pills: Secondary | ICD-10-CM

## 2020-02-20 DIAGNOSIS — B009 Herpesviral infection, unspecified: Secondary | ICD-10-CM

## 2020-02-20 MED ORDER — TRI-SPRINTEC 0.18/0.215/0.25 MG-35 MCG PO TABS: 1.0000 | ORAL_TABLET | Freq: Every day | ORAL | 4 refills | Status: DC

## 2020-02-20 MED ORDER — VALACYCLOVIR HCL 1 G PO TABS: 1000.0000 mg | ORAL_TABLET | Freq: Every day | ORAL | 11 refills | Status: DC

## 2020-02-20 NOTE — Progress Notes (Signed)
Gynecology Annual Exam   Date of Service: 02/20/2020  PCP: Patient, No Pcp Per  Chief Complaint:  Chief Complaint  Patient presents with  . Annual Exam    History of Present Illness: Patient is a 34 y.o. [id number] presents for annual exam. The patient has no gyn complaints today. She mentions a fall down several stairs about 3 and a half weeks ago. The pain she has is on her lower right back and into her buttocks. It is constant and getting worse especially with position changes. She requests a referral to orthopedics today.    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 She uses Special educational needs teacher for contraception and requests 1 year refill.   LMP: Patient's last menstrual period was 01/17/2020. Average Interval: regular, 28 days Duration of flow: 7 days Heavy Menses: no Clots: no Intermenstrual Bleeding: no Postcoital Bleeding: no Dysmenorrhea: no  The patient is sexually active. She currently uses OCP (estrogen/progesterone) for contraception. She denies dyspareunia.  The patient does perform self breast exams.  There is notable family history of breast or ovarian cancer in her family. Her paternal grandmother was diagnosed with breast cancer at age 38. The patient requests MyRisk genetic testing, however, she does not want to do that today. She will let us know when she is ready for testing.   The patient wears seatbelts: yes.   The patient has regular exercise: she walks daily and is active with her children. She admits healthy diet, adequate hydration and adequate sleep.    The patient denies current symptoms of depression.    Review of Systems: Review of Systems  Constitutional: Negative.   HENT: Negative.   Eyes: Negative.   Respiratory: Negative.   Cardiovascular: Negative.   Gastrointestinal: Negative.   Genitourinary: Negative.   Musculoskeletal: Positive for back pain.  Skin: Positive for rash.  Neurological: Negative.    Endo/Heme/Allergies: Negative.   Psychiatric/Behavioral: Negative.     Past Medical History:  Patient Active Problem List   Diagnosis Date Noted  . 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 Episode of syncope 11/04/2014    Overview:  Had triage visit for syncope episode around 21 weeks leading to abdominal trauma. No other etiology identified other than vasovagal syncope complicated by hypotension   . 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 BREAST SURGERY: augmentation    . NO PAST SURGERIES      Gynecologic History:  Patient's last menstrual period was 01/17/2020. Contraception: OCP (estrogen/progesterone) Last Pap: 5 or 6 years ago Results were: no abnormalities. She has a history of ASCUS.   Obstetric History: [id number]  Family History:  No family history on file.  Social History:  Social History   Socioeconomic History  . Marital status: Single    Spouse name: Not on file  . Number of children: Not on file  . Years of education: Not on file  . Highest education level: Not on file  Occupational History  . Not on file  Tobacco Use  . Smoking status: Never Smoker  . Smokeless tobacco: Never Used  Substance and Sexual Activity  . Alcohol use: Yes    Comment: occ  . Drug use: No  . Sexual activity: Yes    Comment: partner had vasectomy  Other Topics Concern  . Not on file  Social History Narrative  . Not on file   Social Determinants of Health   Financial Resource Strain:   . Difficulty of Paying Living Expenses:   Food Insecurity:   . Worried About Charity fundraiser in the Last Year:   . Arboriculturist in the Last Year:   Transportation Needs:   . Film/video editor (Medical):   Marland Kitchen Lack of Transportation (Non-Medical):   Physical Activity:   . Days of Exercise per Week:   . Minutes of Exercise per Session:   Stress:   . Feeling of  Stress :   Social Connections:   . Frequency of Communication with Friends and Family:   . Frequency of Social Gatherings with Friends and Family:   . Attends Religious Services:   . Active Member of Clubs or Organizations:   . Attends Archivist Meetings:   Marland Kitchen Marital Status:   Intimate Partner Violence:   . Fear of Current or Ex-Partner:   . Emotionally Abused:   Marland Kitchen Physically Abused:   . Sexually Abused:     Allergies:  No Known Allergies  Medications: Prior to Admission medications   Medication Sig Start Date End Date Taking? Authorizing Provider  valACYclovir (VALTREX) 1000 MG tablet Take 1 tablet (1,000 mg total) by mouth daily. 02/20/20 03/21/20 Yes Rod Can, CNM  aspirin-acetaminophen-caffeine (EXCEDRIN MIGRAINE) [phone] MG tablet Take 2 tablets by mouth every 6 (six) hours as needed for headache.    [provider]  Multiple Vitamin (MULTIVITAMIN WITH MINERALS) TABS tablet Take 1 tablet by mouth daily.    [provider]  TRI-SPRINTEC 0.18/0.215/0.25 MG-35 MCG tablet Take 1 tablet by mouth daily. 02/20/20   Rod Can, CNM    Physical Exam Vitals: Blood pressure 118/74, height 5\' 7"  (1.702 m), weight 154 lb (69.9 kg), last menstrual period 01/17/2020.  General: NAD HEENT: normocephalic, anicteric Thyroid: no enlargement, no palpable nodules Pulmonary: No increased work of breathing, CTAB Cardiovascular: RRR, distal pulses 2+ Breast: Breast symmetrical, no tenderness, no palpable nodules or masses, no skin or nipple retraction present, no nipple discharge.  No axillary or supraclavicular lymphadenopathy. Scars present from augmentation surgery. Abdomen: NABS, soft, non-tender, non-distended.  Umbilicus without lesions.  No hepatomegaly, splenomegaly or masses palpable. No evidence of hernia  Genitourinary:  External: Normal external female genitalia.  Normal urethral meatus, normal Bartholin's and Skene's glands.    Vagina: Normal  vaginal mucosa, no evidence of prolapse.    Cervix: Grossly normal in appearance, no bleeding, no CMT  Uterus: Non-enlarged, mobile, normal contour.    Adnexa: ovaries non-enlarged, no adnexal masses  Rectal: deferred  Lymphatic: no evidence of inguinal lymphadenopathy Extremities: no edema, erythema, or tenderness Neurologic: Grossly intact Psychiatric: mood appropriate, affect full    Assessment: 34 y.o. G3P2002 routine annual exam  Plan: Problem List Items Addressed This Visit      Other   Herpes simplex   Relevant Medications   valACYclovir (VALTREX) 1000 MG tablet    Other Visit Diagnoses    Well woman exam with routine gynecological exam    -  Primary   Relevant Orders   Cytology - PAP   Cervical cancer screening       Relevant Orders   Cytology - PAP   Encounter for surveillance of contraceptive pills       Relevant Medications   TRI-SPRINTEC 0.18/0.215/0.25 MG-35 MCG tablet   Acute right-sided low back pain with right-sided sciatica  Relevant Orders   AMB referral to orthopedics      1) STI screening  was offered and declined  2)  ASCCP guidelines and rationale discussed.  Patient opts for every 5 years screening interval  3) Contraception - the patient is currently using  OCP (estrogen/progesterone).  She is happy with her current form of contraception and plans to continue  4) Routine healthcare maintenance including cholesterol, diabetes screening discussed Declines  5) Return in about 1 year (around 02/19/2021) for annual established gyn.   Rod Can, [facility] 02/21/2020, 12:55 PM

## 2020-02-21 ENCOUNTER — Encounter: Payer: Self-pay | Admitting: Advanced Practice Midwife

## 2020-02-25 LAB — CYTOLOGY - PAP
Comment: NEGATIVE
Diagnosis: NEGATIVE
High risk HPV: NEGATIVE

## 2020-03-03 ENCOUNTER — Ambulatory Visit: Payer: Self-pay

## 2020-03-03 ENCOUNTER — Encounter: Payer: Self-pay | Admitting: Family Medicine

## 2020-03-03 ENCOUNTER — Ambulatory Visit: Payer: Self-pay | Admitting: Family Medicine

## 2020-03-03 ENCOUNTER — Other Ambulatory Visit: Payer: Self-pay

## 2020-03-03 DIAGNOSIS — M5441 Lumbago with sciatica, right side: Secondary | ICD-10-CM

## 2020-03-03 MED ORDER — MELOXICAM 15 MG PO TABS: 7.5000 mg | ORAL_TABLET | Freq: Every day | ORAL | 6 refills | Status: DC | PRN

## 2020-03-03 MED ORDER — TIZANIDINE HCL 2 MG PO TABS: 2.0000 mg | ORAL_TABLET | Freq: Every evening | ORAL | 1 refills | Status: DC | PRN

## 2020-03-03 NOTE — Progress Notes (Signed)
   Office Visit Note   Patient: Latoya Luna           Date of Birth: 1986-06-16           MRN: [id number] Visit Date: 03/03/2020 Requested by: Rod Can, CNM [address] [place],  [place] [address] PCP: Patient, No Pcp Per  Subjective: Chief Complaint  Patient presents with  . Lower Back - Pain    HPI: She is here with right-sided low back and leg pain.  Symptoms started about 5 or 6 weeks ago, she slipped and fell down some steps bouncing down the steps on her buttocks.  She had pain but did not need to seek treatment right away.  After a few weeks she was not getting better so she went to her PCP who recommended coming here for evaluation.  No bowel or bladder dysfunction, pain is worse when being on her feet all day.  She works [employer].  When she was younger she was told she has some narrowing of her lumbar disc spaces but she has not really had any troubles until now.  She is otherwise in good health.              ROS:   All other systems were reviewed and are negative.  Objective: Vital Signs: There were no vitals taken for this visit.  Physical Exam:  General:  Alert and oriented, in no acute distress. Pulm:  Breathing unlabored. Psy:  Normal mood, congruent affect. Skin: No visible bruising.  Female chaperone female chaperone was present. No scoliosis, good flexibility.  Negative stork test, negative straight leg raise.  She is tender near the right SI joint joint and in the sciatic notch.  Lower extremity strength and reflexes are normal, piriformis stretch is equivocal.  Imaging: None today  Assessment & Plan: 1.  Right-sided low back/posterior hip pain, possibilities include sacroiliac dysfunction, piriformis contusion, lumbar disc protrusion.  Neurologic exam is nonfocal. -We discussed treatment options, she wants to proceed with MRI scan so that she can know what is going on prior to initiating treatment.  She pays cash for her treatment and does not  want to waste money on therapy without knowing what is wrong first. -Zanaflex given as needed.  Meloxicam as needed.  We will either refer to physical therapy or chiropractic in [place] depending on MRI results.     Procedures: No procedures performed  No notes on file     PMFS History: Patient Active Problem List   Diagnosis Date Noted  . 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 Episode of syncope 11/04/2014  . 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 Anxiety   . 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 Depression   . Seizures ([place])    last seizure 5 years ago    History reviewed. No pertinent family history.  Past Surgical History:  Procedure Laterality Date  . BREAST SURGERY    . NO PAST SURGERIES     Social History   Occupational History  . Not on file  Tobacco Use  . Smoking status: Never Smoker  . Smokeless tobacco: Never Used  Substance and Sexual Activity  . Alcohol use: Yes    Comment: occ  . Drug use: No  . Sexual activity: Yes    Comment: partner had vasectomy

## 2020-03-03 NOTE — Progress Notes (Signed)
[place] [address] down a steps a couple weeks ago Pain in right lower back into hip with leg from time to time

## 2020-03-23 ENCOUNTER — Ambulatory Visit: Admission: RE | Admit: 2020-03-23 | Payer: Self-pay | Source: Ambulatory Visit

## 2020-05-05 ENCOUNTER — Encounter: Payer: Self-pay | Admitting: Obstetrics and Gynecology

## 2020-05-05 ENCOUNTER — Ambulatory Visit: Payer: Self-pay | Admitting: Obstetrics and Gynecology

## 2020-05-05 ENCOUNTER — Other Ambulatory Visit: Payer: Self-pay

## 2020-05-05 VITALS — BP 100/70 | Ht 67.0 in | Wt 155.0 lb

## 2020-05-05 DIAGNOSIS — F419 Anxiety disorder, unspecified: Secondary | ICD-10-CM | POA: Insufficient documentation

## 2020-05-05 DIAGNOSIS — R399 Unspecified symptoms and signs involving the genitourinary system: Secondary | ICD-10-CM

## 2020-05-05 LAB — POCT URINALYSIS DIPSTICK
Bilirubin, UA: NEGATIVE
Blood, UA: NEGATIVE
Glucose, UA: NEGATIVE
Ketones, UA: NEGATIVE
Leukocytes, UA: NEGATIVE
Nitrite, UA: NEGATIVE
Protein, UA: NEGATIVE
Spec Grav, UA: 1.01 (ref 1.010–1.025)
pH, UA: 6 (ref 5.0–8.0)

## 2020-05-05 MED ORDER — CLONAZEPAM 1 MG PO TABS: 1.0000 mg | ORAL_TABLET | Freq: Two times a day (BID) | ORAL | 0 refills | Status: DC | PRN

## 2020-05-05 NOTE — Progress Notes (Signed)
Patient, No Pcp Per   Chief Complaint  Patient presents with  . Urinary Tract Infection    urgency and pain urinating, back pain x 6 days    HPI:      [name] is a 34 y.o. [id number] whose LMP was Patient's last menstrual period was 04/23/2020 (exact date)., presents today for UTI sx of urinary frequency, dysuria, LBP, and pelvic pain since last wk. No hematuria, fever, but felt bad initially. Was on vacation and did teledoc. Started on bactrim BID for 7 days. Has taken 5 doses and starting to feel better. Hx of pyelo in past. No vag sx.   Pt also with hx of anxiety/depression and did zoloft in the past. Didn't like the way it made her feel. Doesn't like to take meds so trying to control depression with exercise, etc. But, has anxiety with panic attacks. Going through custody battle now and sx worse. Has syncope sometimes due to panic attacks. Did klonopin in past and would like RF if possible. Doesn't see previous MD any longer.   Last annual 3/21.  PT IS SELF PAY  Past Medical History:  Diagnosis Date  . Anxiety   . 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 Depression   . Seizures ([place])    last seizure 5 years ago    Past Surgical History:  Procedure Laterality Date  . BREAST SURGERY    . NO PAST SURGERIES      History reviewed. No pertinent family history.  Social History   Socioeconomic History  . Marital status: Single    Spouse name: Not on file  . Number of children: Not on file  . Years of education: Not on file  . Highest education level: Not on file  Occupational History  . Not on file  Tobacco Use  . Smoking status: Never Smoker  . Smokeless tobacco: Never Used  Substance and Sexual Activity  . Alcohol use: Yes    Comment: occ  . Drug use: No  . Sexual activity: Yes    Comment: partner had vasectomy  Other Topics Concern  . Not on file  Social History Narrative  . Not on file   Social Determinants of Health   Financial Resource Strain:     . Difficulty of Paying Living Expenses:   Food Insecurity:   . Worried About Charity fundraiser in the Last Year:   . Arboriculturist in the Last Year:   Transportation Needs:   . Film/video editor (Medical):   Marland Kitchen Lack of Transportation (Non-Medical):   Physical Activity:   . Days of Exercise per Week:   . Minutes of Exercise per Session:   Stress:   . Feeling of Stress :   Social Connections:   . Frequency of Communication with Friends and Family:   . Frequency of Social Gatherings with Friends and Family:   . Attends Religious Services:   . Active Member of Clubs or Organizations:   . Attends Archivist Meetings:   Marland Kitchen Marital Status:   Intimate Partner Violence:   . Fear of Current or Ex-Partner:   . Emotionally Abused:   Marland Kitchen Physically Abused:   . Sexually Abused:     Outpatient Medications Prior to Visit  Medication Sig Dispense Refill  . Multiple Vitamin (MULTIVITAMIN WITH MINERALS) TABS tablet Take 1 tablet by mouth daily.    . TRI-SPRINTEC 0.18/0.215/0.25 MG-35 MCG tablet Take 1 tablet by  mouth daily. 3 Package 4  . meloxicam (MOBIC) 15 MG tablet Take 0.5-1 tablets (7.5-15 mg total) by mouth daily as needed for pain. 30 tablet 6  . tiZANidine (ZANAFLEX) 2 MG tablet Take 1-2 tablets (2-4 mg total) by mouth at bedtime as needed for muscle spasms. 60 tablet 1   No facility-administered medications prior to visit.      ROS:  Review of Systems  Constitutional: Negative for fever.  Gastrointestinal: Negative for blood in stool, constipation, diarrhea, nausea and vomiting.  Genitourinary: Positive for dysuria and frequency. Negative for dyspareunia, flank pain, hematuria, urgency, vaginal bleeding, vaginal discharge and vaginal pain.  Musculoskeletal: Positive for back pain.  Skin: Negative for rash.   OBJECTIVE:   Vitals:  BP 100/70   Ht 5\' 7"  (1.702 m)   Wt 155 lb (70.3 kg)   LMP 04/23/2020 (Exact Date)   BMI 24.28 kg/m   Physical Exam Vitals  reviewed.  Constitutional:      Appearance: She is well-developed.  Pulmonary:     Effort: Pulmonary effort is normal.  Musculoskeletal:        General: Normal range of motion.     Cervical back: Normal range of motion.  Skin:    General: Skin is warm and dry.  Neurological:     General: No focal deficit present.     Mental Status: She is alert and oriented to person, place, and time.     Cranial Nerves: No cranial nerve deficit.  Psychiatric:        Mood and Affect: Mood normal.        Behavior: Behavior normal.        Thought Content: Thought content normal.        Judgment: Judgment normal.     Results: Results for orders placed or performed in visit on 05/05/20 (from the past 24 hour(s))  POCT Urinalysis Dipstick     Status: Normal   Collection Time: 05/05/20  4:57 PM  Result Value Ref Range   Color, UA yellow    Clarity, UA clear    Glucose, UA Negative Negative   Bilirubin, UA neg    Ketones, UA neg    Spec Grav, UA 1.010 1.010 - 1.025   Blood, UA neg    pH, UA 6.0 5.0 - 8.0   Protein, UA Negative Negative   Urobilinogen, UA     Nitrite, UA neg    Leukocytes, UA Negative Negative   Appearance     Odor       Assessment/Plan: UTI symptoms - Plan: Urine Culture, POCT Urinalysis Dipstick; Neg UA. On Bactrim currently. Check C&S. Will f/u with results. If on correct abx, just complete course. F/u sooner if sx worsen due to hx of pyelo.  Anxiety - Plan: clonazePAM (KLONOPIN) 1 MG tablet; Rx RF clonazepam to take sparingly prn panic attacks. Discussed tx with SSRI if prn dosing not sufficient. Pt understands. Pt under increased stress currently.  F/u prn.    Meds ordered this encounter  Medications  . clonazePAM (KLONOPIN) 1 MG tablet    Sig: Take 1 tablet (1 mg total) by mouth 2 (two) times daily as needed for anxiety.    Dispense:  20 tablet    Refill:  0    Order Specific Question:   Supervising Provider    Answer:   Gae Dry [[id number]]      Return  if symptoms worsen or fail to improve.  Latoya Luna B. Latoya Peavy, PA-C 05/05/2020 5:00  PM

## 2020-05-05 NOTE — Patient Instructions (Signed)
I value your feedback and entrusting us with your care. If you get a [facility] patient survey, I would appreciate you taking the time to let us know about your experience today. Thank you!  As of November 07, 2019, your lab results will be released to your MyChart immediately, before I even have a chance to see them. Please give me time to review them and contact you if there are any abnormalities. Thank you for your patience.  

## 2020-05-08 LAB — URINE CULTURE: Organism ID, Bacteria: NO GROWTH

## 2020-05-27 ENCOUNTER — Emergency Department
Admission: EM | Admit: 2020-05-27 | Discharge: 2020-05-27 | Disposition: A | Discharge status: Home / Self Care | Payer: Self-pay | Attending: Emergency Medicine | Admitting: Emergency Medicine

## 2020-05-27 ENCOUNTER — Encounter: Payer: Self-pay | Admitting: Emergency Medicine

## 2020-05-27 ENCOUNTER — Other Ambulatory Visit: Payer: Self-pay

## 2020-05-27 DIAGNOSIS — Z1152 Encounter for screening for COVID-19: Secondary | ICD-10-CM

## 2020-05-27 DIAGNOSIS — R6883 Chills (without fever): Secondary | ICD-10-CM | POA: Insufficient documentation

## 2020-05-27 DIAGNOSIS — Z20822 Contact with and (suspected) exposure to covid-19: Secondary | ICD-10-CM | POA: Insufficient documentation

## 2020-05-27 DIAGNOSIS — B349 Viral infection, unspecified: Secondary | ICD-10-CM | POA: Insufficient documentation

## 2020-05-27 DIAGNOSIS — J029 Acute pharyngitis, unspecified: Secondary | ICD-10-CM | POA: Insufficient documentation

## 2020-05-27 LAB — URINALYSIS, COMPLETE (UACMP) WITH MICROSCOPIC
Bilirubin Urine: NEGATIVE
Glucose, UA: NEGATIVE mg/dL
Hgb urine dipstick: NEGATIVE
Ketones, ur: NEGATIVE mg/dL
Leukocytes,Ua: NEGATIVE
Nitrite: NEGATIVE
Protein, ur: NEGATIVE mg/dL
Specific Gravity, Urine: 1.006 (ref 1.005–1.030)
WBC, UA: NONE SEEN WBC/hpf (ref 0–5)
pH: 7 (ref 5.0–8.0)

## 2020-05-27 LAB — CBC
HCT: 37.8 % (ref 36.0–46.0)
Hemoglobin: 13.1 g/dL (ref 12.0–15.0)
MCH: 32 pg (ref 26.0–34.0)
MCHC: 34.7 g/dL (ref 30.0–36.0)
MCV: 92.2 fL (ref 80.0–100.0)
Platelets: 230 10*3/uL (ref 150–400)
RBC: 4.1 MIL/uL (ref 3.87–5.11)
RDW: 12.4 % (ref 11.5–15.5)
WBC: 5.9 10*3/uL (ref 4.0–10.5)
nRBC: 0 % (ref 0.0–0.2)

## 2020-05-27 LAB — BASIC METABOLIC PANEL
Anion gap: 9 (ref 5–15)
BUN: 15 mg/dL (ref 6–20)
CO2: 27 mmol/L (ref 22–32)
Calcium: 9.1 mg/dL (ref 8.9–10.3)
Chloride: 102 mmol/L (ref 98–111)
Creatinine, Ser: 0.66 mg/dL (ref 0.44–1.00)
GFR calc Af Amer: >60 mL/min (ref >60)
GFR calc non Af Amer: >60 mL/min (ref >60)
Glucose, Bld: 92 mg/dL (ref 70–99)
Potassium: 3.9 mmol/L (ref 3.5–5.1)
Sodium: 138 mmol/L (ref 135–145)

## 2020-05-27 LAB — MAGNESIUM: Magnesium: 1.9 mg/dL (ref 1.7–2.4)

## 2020-05-27 LAB — TROPONIN I (HIGH SENSITIVITY): Troponin I (High Sensitivity): 4 ng/L (ref <18)

## 2020-05-27 LAB — POCT PREGNANCY, URINE: Preg Test, Ur: NEGATIVE

## 2020-05-27 LAB — SARS CORONAVIRUS 2 BY RT PCR (HOSPITAL ORDER, PERFORMED IN ~~LOC~~ HOSPITAL LAB): SARS Coronavirus 2: NEGATIVE

## 2020-05-27 NOTE — Discharge Instructions (Signed)
Follow-up with your primary care provider if any continued problems or concerns.  Your Covid test can be seen on my chart.  If it is positive you will need to stay at home an additional 10 days and also quarantine with people you have at home.  Tylenol as needed for fever and body aches.  You may take decongestants for nasal congestion and drainage.  Also use saline nose spray as needed.

## 2020-05-27 NOTE — ED Notes (Signed)
See triage note Presents with slight sore throat and body aches    States she became dizzy at work today  States she just "doesn't fell well"

## 2020-05-27 NOTE — ED Triage Notes (Signed)
Patient reports worsening body aches, sore throat and dizziness for the last few days. States her daughter's school has had 3 positive covid cases in the last week.

## 2020-05-27 NOTE — ED Provider Notes (Signed)
[facility] Emergency Department Provider Note   ____________________________________________   First MD Initiated Contact with Patient 05/27/20 1335     (approximate)  I have reviewed the triage vital signs and the nursing notes.   HISTORY  Chief Complaint Dizziness and Generalized Body Aches   HPI Latoya Luna is a 34 y.o. female presents to the ED with complaint of slight sore throat, body aches and chills.  Patient states this began a few days ago.  She states that there have been 3+ Covid cases in her daughter's school.  She states that she continues to feel worse.  She denies any loss of taste or smell.  There is been no vomiting or diarrhea.  Patient did not get the vaccination.  She rates her pain as 6 out of 10.        Worse. 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 Authorizing Provider  clonazePAM (KLONOPIN) 1 MG tablet Take 1 tablet (1 mg total) by mouth 2 (two) times daily as needed for anxiety. 08/02/17   Copland, Deirdre Evener, PA-C  Multiple Vitamin (MULTIVITAMIN WITH MINERALS) TABS tablet Take 1 tablet by mouth daily.    [provider]  TRI-SPRINTEC 0.18/0.215/0.25 MG-35 MCG tablet Take 1 tablet by mouth daily. 02/20/20   Rod Can, CNM    Allergies Patient has no known allergies.  No family history on file.  Social History Social History   Tobacco Use   Smoking status: Never Smoker   Smokeless tobacco: Never Used  Substance Use Topics   Alcohol  use: Yes    Comment: occ   Drug use: No    Review of Systems Constitutional: No fever/positive chills Eyes: No visual changes. ENT: Positive for throat. Cardiovascular: Denies chest pain. Respiratory: Denies shortness of breath. Gastrointestinal: No abdominal pain.  No nausea, no vomiting.  No diarrhea.  Genitourinary: Negative for dysuria. Musculoskeletal: Positive for body aches. Skin: Negative for rash. Neurological: Negative for headaches, focal weakness or numbness. ____________________________________________   PHYSICAL EXAM:  VITAL SIGNS: ED Triage Vitals  Enc Vitals Group     BP 05/27/20 1221 117/90     Pulse Rate 05/27/20 1221 73     Resp 05/27/20 1221 18     Temp 05/27/20 1221 98.6 F (37 C)     Temp Source 05/27/20 1221 Oral     SpO2 05/27/20 1221 98 %     Weight 05/27/20 1222 148 lb (67.1 kg)     Height 05/27/20 1222 5\' 7"  (1.702 m)     Head Circumference --      Peak Flow --      Pain Score 05/27/20 1224 6     Pain Loc --      Pain Edu? --      Excl. in [place]? --     Constitutional: Alert and oriented. Well appearing and in no acute distress. Eyes: Conjunctivae  are normal.  Head: Atraumatic. Nose: Positive congestion/rhinnorhea. Mouth/Throat: Mucous membranes are moist.  Oropharynx non-erythematous.  No exudate and uvula is midline. Neck: No stridor.   Hematological/Lymphatic/Immunilogical: No cervical lymphadenopathy. Cardiovascular: Normal rate, regular rhythm. Grossly normal heart sounds.  Good peripheral circulation. Respiratory: Normal respiratory effort.  No retractions. Lungs CTAB. Gastrointestinal: Soft and nontender. No distention. Musculoskeletal: Moves upper and lower extremities with any difficulty.  Normal gait was noted. Neurologic:  Normal speech and language. No gross focal neurologic deficits are appreciated. No gait instability. Skin:  Skin is warm, dry and intact. No rash noted. Psychiatric: Mood and affect are normal. Speech and  behavior are normal.  ____________________________________________   LABS (all labs ordered are listed, but only abnormal results are displayed)  Labs Reviewed  URINALYSIS, COMPLETE (UACMP) WITH MICROSCOPIC - Abnormal; Notable for the following components:      Result Value   Color, Urine COLORLESS (*)    APPearance CLEAR (*)    Bacteria, UA RARE (*)    All other components within normal limits  SARS CORONAVIRUS 2 BY RT PCR (HOSPITAL ORDER, [place] [facility])  BASIC METABOLIC PANEL  CBC  MAGNESIUM  POC URINE PREG, ED  POCT PREGNANCY, URINE  TROPONIN I (HIGH SENSITIVITY)   ____________________________________________  EKG EKG was reviewed by Dr. On the major side of the ED. Normal sinus rhythm with ventricular rate of 92.  ____________________________________________  RADIOLOGY  ED MD interpretation: Deferred  Official radiology report(s): No results found.  ____________________________________________   PROCEDURES  Procedure(s) performed (including Critical Care):  Procedures   ____________________________________________   INITIAL IMPRESSION / ASSESSMENT AND PLAN / ED COURSE  As part of my medical decision making, I reviewed the following data within the electronic MEDICAL RECORD NUMBER Notes from prior ED visits and [place] Controlled Substance Database  34 year old female presents to the ED with complaint of viral symptoms for the last 2 days.  Patient states that she has nasal congestion, sore throat, body aches, dizziness and a subjective fever with chills.  Patient states that there were 3 cases of Covid in her daughter's school and she may have been exposed.  Lab work was reassuring and Covid test was pending at the time of her discharge.  Patient is aware that she can find her results on my chart.  She is encouraged to drink fluids and take Tylenol or ibuprofen as needed for body aches, throat or headache.  Patient was given a note to remain out  of work.  She is aware that if her Covid test is positive that she and her family will need to quarantine and information was given to her.   ____________________________________________   FINAL CLINICAL IMPRESSION(S) / ED DIAGNOSES  Final diagnoses:  Viral syndrome  Encounter for screening for COVID-19     ED Discharge Orders    None       Note:  This document was prepared using Dragon voice recognition software and may include unintentional dictation errors.    Johnn Hai, PA-C 05/27/20 1503    Blake Divine, MD 05/28/20 [phone]

## 2020-06-15 ENCOUNTER — Encounter: Payer: Self-pay | Admitting: Obstetrics and Gynecology

## 2020-06-15 ENCOUNTER — Other Ambulatory Visit: Payer: Self-pay | Admitting: Obstetrics and Gynecology

## 2020-06-15 DIAGNOSIS — F419 Anxiety disorder, unspecified: Secondary | ICD-10-CM

## 2020-06-15 MED ORDER — CLONAZEPAM 1 MG PO TABS: 1.0000 mg | ORAL_TABLET | Freq: Two times a day (BID) | ORAL | 0 refills | Status: DC | PRN

## 2020-06-15 NOTE — Progress Notes (Signed)
Rx RF klonopin prn anxiety

## 2020-07-24 ENCOUNTER — Other Ambulatory Visit: Payer: Self-pay | Admitting: Obstetrics and Gynecology

## 2020-07-24 ENCOUNTER — Encounter: Payer: Self-pay | Admitting: Obstetrics and Gynecology

## 2020-07-24 DIAGNOSIS — F419 Anxiety disorder, unspecified: Secondary | ICD-10-CM

## 2020-07-24 MED ORDER — CLONAZEPAM 1 MG PO TABS: 1.0000 mg | ORAL_TABLET | Freq: Two times a day (BID) | ORAL | 0 refills | Status: DC | PRN

## 2020-07-24 NOTE — Progress Notes (Signed)
Rx RF clonazepam prn anxiety

## 2020-07-27 ENCOUNTER — Encounter: Payer: Self-pay | Admitting: Emergency Medicine

## 2020-07-27 ENCOUNTER — Other Ambulatory Visit: Payer: Self-pay

## 2020-07-27 ENCOUNTER — Emergency Department
Admission: EM | Admit: 2020-07-27 | Discharge: 2020-07-27 | Disposition: A | Discharge status: Home / Self Care | Payer: Self-pay | Attending: Emergency Medicine | Admitting: Emergency Medicine

## 2020-07-27 DIAGNOSIS — Z5321 Procedure and treatment not carried out due to patient leaving prior to being seen by health care provider: Secondary | ICD-10-CM | POA: Insufficient documentation

## 2020-07-27 DIAGNOSIS — J029 Acute pharyngitis, unspecified: Secondary | ICD-10-CM | POA: Insufficient documentation

## 2020-07-27 DIAGNOSIS — R05 Cough: Secondary | ICD-10-CM | POA: Insufficient documentation

## 2020-07-27 NOTE — ED Notes (Signed)
Pt reported to frone desk that they were going to UC for testing.

## 2020-07-27 NOTE — ED Triage Notes (Signed)
Pt reports sore throat and cough for 2 days and feeling tired. Pt reports exposed to [place] and wants a test

## 2020-07-27 NOTE — ED Triage Notes (Signed)
Pt called from [facility] to treatment room, no response 

## 2020-07-28 ENCOUNTER — Encounter: Payer: Self-pay | Admitting: Obstetrics and Gynecology

## 2020-08-24 ENCOUNTER — Other Ambulatory Visit: Payer: Self-pay | Admitting: Obstetrics and Gynecology

## 2020-08-24 ENCOUNTER — Encounter: Payer: Self-pay | Admitting: Obstetrics and Gynecology

## 2020-08-24 DIAGNOSIS — F419 Anxiety disorder, unspecified: Secondary | ICD-10-CM

## 2020-08-24 MED ORDER — CLONAZEPAM 1 MG PO TABS: 1.0000 mg | ORAL_TABLET | Freq: Two times a day (BID) | ORAL | 0 refills | Status: DC | PRN

## 2020-08-24 NOTE — Progress Notes (Signed)
Rx RF clonazepam prn sparingly

## 2020-10-03 ENCOUNTER — Encounter: Payer: Self-pay | Admitting: Obstetrics and Gynecology

## 2020-10-05 ENCOUNTER — Other Ambulatory Visit: Payer: Self-pay | Admitting: Obstetrics and Gynecology

## 2020-10-05 DIAGNOSIS — F419 Anxiety disorder, unspecified: Secondary | ICD-10-CM

## 2020-10-05 MED ORDER — CLONAZEPAM 1 MG PO TABS: 1.0000 mg | ORAL_TABLET | Freq: Two times a day (BID) | ORAL | 0 refills | Status: DC | PRN

## 2020-10-05 NOTE — Progress Notes (Signed)
Rx RF clonazepam. #20 sent 9/21.

## 2020-12-04 ENCOUNTER — Other Ambulatory Visit: Payer: Self-pay | Admitting: Obstetrics and Gynecology

## 2020-12-04 ENCOUNTER — Encounter: Payer: Self-pay | Admitting: Obstetrics and Gynecology

## 2020-12-04 DIAGNOSIS — F419 Anxiety disorder, unspecified: Secondary | ICD-10-CM

## 2020-12-04 DIAGNOSIS — B009 Herpesviral infection, unspecified: Secondary | ICD-10-CM

## 2020-12-04 MED ORDER — VALACYCLOVIR HCL 1 G PO TABS: 1000.0000 mg | ORAL_TABLET | Freq: Every day | ORAL | 1 refills | Status: DC

## 2020-12-04 MED ORDER — CLONAZEPAM 1 MG PO TABS: 1.0000 mg | ORAL_TABLET | Freq: Two times a day (BID) | ORAL | 0 refills | Status: DC | PRN

## 2020-12-04 NOTE — Progress Notes (Signed)
Rx RF valtrex and klonopin. Annual due 3/22

## 2020-12-09 ENCOUNTER — Ambulatory Visit (INDEPENDENT_AMBULATORY_CARE_PROVIDER_SITE_OTHER): Payer: BC Managed Care – PPO | Admitting: Obstetrics and Gynecology

## 2020-12-09 ENCOUNTER — Ambulatory Visit: Payer: Self-pay | Admitting: Obstetrics and Gynecology

## 2020-12-09 ENCOUNTER — Other Ambulatory Visit: Payer: Self-pay

## 2020-12-09 ENCOUNTER — Encounter: Payer: Self-pay | Admitting: Obstetrics and Gynecology

## 2020-12-09 ENCOUNTER — Other Ambulatory Visit (HOSPITAL_COMMUNITY): Admission: RE | Admit: 2020-12-09 | Payer: BC Managed Care – PPO | Source: Ambulatory Visit

## 2020-12-09 VITALS — BP 110/70 | HR 88 | Ht 67.0 in | Wt 150.0 lb

## 2020-12-09 DIAGNOSIS — B009 Herpesviral infection, unspecified: Secondary | ICD-10-CM | POA: Diagnosis not present

## 2020-12-09 DIAGNOSIS — Z3041 Encounter for surveillance of contraceptive pills: Secondary | ICD-10-CM | POA: Diagnosis not present

## 2020-12-09 DIAGNOSIS — N760 Acute vaginitis: Secondary | ICD-10-CM | POA: Diagnosis not present

## 2020-12-09 DIAGNOSIS — Z113 Encounter for screening for infections with a predominantly sexual mode of transmission: Secondary | ICD-10-CM | POA: Diagnosis not present

## 2020-12-09 DIAGNOSIS — R875 Abnormal microbiological findings in specimens from female genital organs: Secondary | ICD-10-CM

## 2020-12-09 MED ORDER — NORGESTIMATE-ETH ESTRADIOL 0.25-35 MG-MCG PO TABS: 1.0000 | ORAL_TABLET | Freq: Every day | ORAL | 3 refills | Status: DC

## 2020-12-09 MED ORDER — VALACYCLOVIR HCL 1 G PO TABS: 1000.0000 mg | ORAL_TABLET | Freq: Every day | ORAL | 12 refills | Status: AC

## 2020-12-09 NOTE — Progress Notes (Signed)
HPI:      Ms. ASNA MULDROW is a 35 y.o. [phone] who LMP was Patient's last menstrual period was 12/04/2020., presents today for a problem visit.  She complains of vaginal irritation intermittently over the last month. 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  Vaginitis: Patient denies changes in vaginal discharge or odor with discharge. Vaginal symptoms include local irritation, "something doesn't feel right." Notes that irritation is more pronounced following intercourse. Patient denies further sx.STI Risk: Very low risk of STD exposure, patient reports long-term single partner. Discharge described as: normal and physiologic.Other associated symptoms: Patient reports intermittently feeling like she is going to have an HSV outbreak and is unsure if her current sx are related to prodromal presentation of HSV vs BV/yeast. Menstrual pattern: She had been bleeding irregularly with breakthrough bleeding/spotting throughout menstrual cycle even with use of OCPs. Patient reports that bleeding could be follow intercourse, but it unsure due to irregular bleeding pattern. Contraception: OCP (estrogen/progesterone). Patient currently prescribed triphasic OCP, unhappy with bleeding profile at this time.  PMHx: She  has a past medical history of Anxiety, Cancer ([place]), Depression, and Seizures ([place]). Also,  has a past surgical history that includes Breast surgery., family history is not on file.,  reports that she has never smoked. She has never used smokeless tobacco. She reports current alcohol use. She reports that she does not use drugs.  She has a current medication list which includes the following prescription(s): clonazepam, multivitamin with minerals, norgestimate-ethinyl estradiol, tri-sprintec, and valacyclovir. Also, has No Known Allergies.  Review of Systems  All other systems reviewed and are negative.   Objective: BP 110/70 (BP Location: Left  Arm, Patient Position: Sitting, Cuff Size: Normal)   Pulse 88   Ht 5\' 7"  (1.702 m)   Wt 150 lb (68 kg)   LMP 12/04/2020   BMI 23.49 kg/m  Physical Exam Constitutional:      Appearance: Normal appearance. She is normal weight.  Genitourinary:     Genitourinary Comments: External: Normal appearing vulva. No lesions noted.  Speculum examination: Normal appearing cervix. Small amount of blood in the vaginal vault. Scant discharge. Bimanual examination: Uterus midline, non-tender, normal in size, shape and contour.  No CMT. No adnexal masses. No adnexal tenderness. Pelvis not fixed.     Cardiovascular:     Rate and Rhythm: Normal rate.  Pulmonary:     Effort: Pulmonary effort is normal.  Abdominal:     General: Abdomen is flat.     Palpations: Abdomen is soft.     Tenderness: There is no abdominal tenderness.  Musculoskeletal:        General: Normal range of motion.  Neurological:     General: No focal deficit present.     Mental Status: She is alert and oriented to person, place, and time.  Skin:    General: Skin is warm and dry.  Psychiatric:        Mood and Affect: Mood normal.        Behavior: Behavior normal.  Vitals reviewed.    WET PREP:   Negative whiff test, possible, infrequent clue cells  Findings are equivocal.  ASSESSMENT/PLAN:    35 yo [id number] presents with concern for acute vaginitis. Pelvic exam unremarkable. Wet mount with possible clue cells. Aptima swab collected for equivocal finding on wet mount. Offered STI testing, patient requested with vaginal swab, declined blood testing. Patient with breakthrough bleeding with current  contraception, will try monophasic OCP. Patient requesting additional refills for HSV suppressive therapy, filled. Will follow-up with lab results. Plan made to RTC for annual visit in March or as needed if symptoms persist.  Problem List Items Addressed This Visit      Other   Herpes simplex   Relevant Medications   valACYclovir  (VALTREX) 1000 MG tablet   Encounter for surveillance of contraceptive pills - Primary   Relevant Medications   norgestimate-ethinyl estradiol (SPRINTEC 28) 0.25-35 MG-MCG tablet    Other Visit Diagnoses    Acute vaginitis       Relevant Orders   Cervicovaginal ancillary only   Screen for sexually transmitted diseases       Relevant Orders   Cervicovaginal ancillary only     Orlie Pollen, CNM, MSN

## 2020-12-10 LAB — CERVICOVAGINAL ANCILLARY ONLY
Bacterial Vaginitis (gardnerella): NEGATIVE
Candida Glabrata: NEGATIVE
Candida Vaginitis: NEGATIVE
Chlamydia: NEGATIVE
Comment: NEGATIVE
Comment: NEGATIVE
Comment: NEGATIVE
Comment: NEGATIVE
Comment: NEGATIVE
Comment: NORMAL
Neisseria Gonorrhea: NEGATIVE
Trichomonas: NEGATIVE

## 2020-12-14 ENCOUNTER — Ambulatory Visit: Payer: Self-pay | Admitting: Obstetrics and Gynecology

## 2020-12-29 ENCOUNTER — Emergency Department: Payer: BC Managed Care – PPO

## 2020-12-29 ENCOUNTER — Emergency Department
Admission: EM | Admit: 2020-12-29 | Discharge: 2020-12-30 | Disposition: A | Discharge status: Home / Self Care | Payer: BC Managed Care – PPO | Attending: Emergency Medicine | Admitting: Emergency Medicine

## 2020-12-29 ENCOUNTER — Other Ambulatory Visit: Payer: Self-pay

## 2020-12-29 DIAGNOSIS — R55 Syncope and collapse: Secondary | ICD-10-CM | POA: Diagnosis not present

## 2020-12-29 DIAGNOSIS — R079 Chest pain, unspecified: Secondary | ICD-10-CM | POA: Insufficient documentation

## 2020-12-29 DIAGNOSIS — M25512 Pain in left shoulder: Secondary | ICD-10-CM | POA: Insufficient documentation

## 2020-12-29 DIAGNOSIS — M79602 Pain in left arm: Secondary | ICD-10-CM | POA: Diagnosis not present

## 2020-12-29 DIAGNOSIS — R5383 Other fatigue: Secondary | ICD-10-CM | POA: Diagnosis not present

## 2020-12-29 DIAGNOSIS — R531 Weakness: Secondary | ICD-10-CM | POA: Diagnosis not present

## 2020-12-29 DIAGNOSIS — R0789 Other chest pain: Secondary | ICD-10-CM

## 2020-12-29 DIAGNOSIS — Z8541 Personal history of malignant neoplasm of cervix uteri: Secondary | ICD-10-CM | POA: Insufficient documentation

## 2020-12-29 LAB — CBC WITH DIFFERENTIAL/PLATELET
Abs Immature Granulocytes: 0.08 10*3/uL — ABNORMAL HIGH (ref 0.00–0.07)
Basophils Absolute: 0 10*3/uL (ref 0.0–0.1)
Basophils Relative: 1 %
Eosinophils Absolute: 0 10*3/uL (ref 0.0–0.5)
Eosinophils Relative: 1 %
HCT: 33.1 % — ABNORMAL LOW (ref 36.0–46.0)
Hemoglobin: 11.6 g/dL — ABNORMAL LOW (ref 12.0–15.0)
Immature Granulocytes: 2 %
Lymphocytes Relative: 37 %
Lymphs Abs: 1.6 10*3/uL (ref 0.7–4.0)
MCH: 32.6 pg (ref 26.0–34.0)
MCHC: 35 g/dL (ref 30.0–36.0)
MCV: 93 fL (ref 80.0–100.0)
Monocytes Absolute: 0.3 10*3/uL (ref 0.1–1.0)
Monocytes Relative: 7 %
Neutro Abs: 2.2 10*3/uL (ref 1.7–7.7)
Neutrophils Relative %: 52 %
Platelets: 243 10*3/uL (ref 150–400)
RBC: 3.56 MIL/uL — ABNORMAL LOW (ref 3.87–5.11)
RDW: 12 % (ref 11.5–15.5)
Smear Review: NORMAL
WBC: 3.8 10*3/uL — ABNORMAL LOW (ref 4.0–10.5)
nRBC: 0 % (ref 0.0–0.2)

## 2020-12-29 LAB — TROPONIN I (HIGH SENSITIVITY)
Troponin I (High Sensitivity): <2 ng/L (ref <18)
Troponin I (High Sensitivity): 2 ng/L (ref <18)

## 2020-12-29 LAB — COMPREHENSIVE METABOLIC PANEL
ALT: 26 U/L (ref 0–44)
AST: 27 U/L (ref 15–41)
Albumin: 3.7 g/dL (ref 3.5–5.0)
Alkaline Phosphatase: 27 U/L — ABNORMAL LOW (ref 38–126)
Anion gap: 10 (ref 5–15)
BUN: 13 mg/dL (ref 6–20)
CO2: 23 mmol/L (ref 22–32)
Calcium: 8.6 mg/dL — ABNORMAL LOW (ref 8.9–10.3)
Chloride: 105 mmol/L (ref 98–111)
Creatinine, Ser: 0.46 mg/dL (ref 0.44–1.00)
GFR, Estimated: >60 mL/min (ref >60)
Glucose, Bld: 88 mg/dL (ref 70–99)
Potassium: 3.8 mmol/L (ref 3.5–5.1)
Sodium: 138 mmol/L (ref 135–145)
Total Bilirubin: 0.5 mg/dL (ref 0.3–1.2)
Total Protein: 6.9 g/dL (ref 6.5–8.1)

## 2020-12-29 LAB — FIBRIN DERIVATIVES D-DIMER (ARMC ONLY): Fibrin derivatives D-dimer (ARMC): 628.31 ng/mL (FEU) — ABNORMAL HIGH (ref 0.00–499.00)

## 2020-12-29 MED ORDER — SODIUM CHLORIDE 0.9 % IV BOLUS
1000.0000 mL | Freq: Once | INTRAVENOUS | Status: AC
  Administered 2020-12-29: 1000 mL via INTRAVENOUS

## 2020-12-29 MED ORDER — IOHEXOL 350 MG/ML SOLN
75.0000 mL | Freq: Once | INTRAVENOUS | Status: AC | PRN
  Administered 2020-12-29: 75 mL via INTRAVENOUS

## 2020-12-29 NOTE — ED Provider Notes (Signed)
Physical Exam  BP 100/66    Pulse 71    Temp 97.7 F (36.5 C) (Oral)    Resp 16    Ht 5\' 7"  (1.702 m)    Wt 65.8 kg    LMP 12/25/2020    SpO2 99%    BMI 22.71 kg/m   Physical Exam  Gen: No acute distress  Card:  RRR Resp: Normal rise and fall of chest Neuro: Moving all four extremities Psych: Resting currently, calm and cooperative when awake    ED Course/Procedures     None  MDM  Assumed care.  Patient here with chest pain for 2 days with syncopal event with standing.  Troponin x2 unremarkable.  D-dimer elevated.  CTA of chest pending for further disposition.   12:30 AM  Pt's CT of the chest shows no acute abnormality.  Patient's orthostatic vital signs are unremarkable but felt dizzy when walking to the bathroom and then had a syncopal event with standing back at the bed.  She did fall to the ground.  She said she did hit her head but is not complaining of severe headache, numbness, tingling, weakness, vision changes, vomiting, confusion.  Not on blood thinners.  We will continue to monitor.  She reports that she has had syncopal events before.  It appears that she has been seen in 2017 and 2018 for the same.  She is followed by cardiology as an outpatient.  Suspect that this could have been vasovagal in nature.  She reports having some nausea and mild headache.  Will give Tylenol, Zofran.  Will p.o. challenge and give second liter of IV fluids.  Continues to feel very lightheaded or has another syncopal event, patient will need admission.  Repeat EKG is nonischemic.  2:15 AM  Pt's magnesium and potassium levels today are normal.  Repeat blood glucose normal.  Pregnancy test negative.  Repeat EKG nonischemic.  She reports feeling better and was able to ambulate without any near syncopal events.  Reports lightheadedness has improved.  Recommend rest over the next several days, increase fluid intake and follow-up with her cardiologist as an outpatient.  She is comfortable with this  plan.   At this time, I do not feel there is any life-threatening condition present. I have reviewed, interpreted and discussed all results (EKG, imaging, lab, urine as appropriate) and exam findings with patient/family. I have reviewed nursing notes and appropriate previous records.  I feel the patient is safe to be discharged home without further emergent workup and can continue workup as an outpatient as needed. Discussed usual and customary return precautions. Patient/family verbalize understanding and are comfortable with this plan.  Outpatient follow-up has been provided as needed. All questions have been answered.    EKG Interpretation  Date/Time:  Tuesday December 29 2020 18:05:21 EST Ventricular Rate:  82 PR Interval:  186 QRS Duration: 68 QT Interval:  356 QTC Calculation: 415 R Axis:   86 Text Interpretation: Normal sinus rhythm Anteroseptal infarct , age undetermined Abnormal ECG No significant change since last tracing other than Qt interval has improved Confirmed by Pryor Curia [phone]) on 12/30/2020 12:35:18 AM        EKG Interpretation  Date/Time:  Wednesday December 30 2020 00:26:32 EST Ventricular Rate:  73 PR Interval:  208 QRS Duration: 66 QT Interval:  402 QTC Calculation: 442 R Axis:   88 Text Interpretation: Normal sinus rhythm Anteroseptal infarct , age undetermined Abnormal ECG No significant change since last tracing Confirmed by Derius Ghosh,  Cyril Mourning [phone]) on 12/30/2020 12:36:05 AM         Colyn Miron, Delice Bison, DO 12/30/20 [id number]

## 2020-12-29 NOTE — ED Triage Notes (Signed)
First Nurse Note:  Arrives via [facility].  Has some CP this weekend, which resolved.  Had another episode today at work.  Stood up and had a syncopal episode.  Per report  18 g left hand, DBG 105.  NSR  VS wnl.  ASA 325 mg given.  NAD

## 2020-12-29 NOTE — ED Notes (Signed)
Patient transported to CT at this time. 

## 2020-12-29 NOTE — ED Triage Notes (Signed)
PT to ED EMS from work for chief complaint left sided chest pain that started mildly 2 days ago and worsened one hour ago with radiation to left shoulder.  + nausea.  Reports syncopal episode while having cp today  Pt in NAD, RR even and unlabored

## 2020-12-29 NOTE — ED Notes (Signed)
Patient transported to and from CT.

## 2020-12-29 NOTE — ED Provider Notes (Signed)
[facility] Emergency Department Provider Note  Time seen: 9:17 PM  I have reviewed the triage vital signs and the nursing notes.   HISTORY  Chief Complaint Chest Pain   HPI Latoya Luna is a 35 y.o. female with a past medical history of anxiety, depression, presents to the emergency department for chest pain and a syncopal episode.  According to the patient over the past 2 to 3 days she has been experiencing intermittent mild left-sided chest pain, states she is also not been feeling very well which she describes as more fatigue and weakness.  Patient states today at work she got up from her desk and felt very lightheaded and had a syncopal versus near syncopal event.  Patient does state a history of syncope in the past as well.  Denies any pleuritic pain, but states since passing out she feels like the pain is going down into her left shoulder and arm, and feels like her left grip strength is diminished.  Patient states she was having tingling in her hands, she spoke to her manager at work and they recommended she come to the emergency department for evaluation.   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 Authorizing Provider  clonazePAM (KLONOPIN) 1 MG tablet Take 1 tablet (1 mg total) by mouth 2 (two) times daily as needed for anxiety. 03/30/60   Copland, Deirdre Evener, PA-C  Multiple Vitamin (MULTIVITAMIN WITH MINERALS) TABS tablet Take 1 tablet by mouth daily.     [provider]  norgestimate-ethinyl estradiol ([place] 28) 0.25-35 MG-MCG tablet Take 1 tablet by mouth daily. 12/09/20 11/10/21  Orlie Pollen, CNM  TRI-SPRINTEC 0.18/0.215/0.25 MG-35 MCG tablet Take 1 tablet by mouth daily. 02/20/20   Rod Can, CNM  valACYclovir (VALTREX) 1000 MG tablet Take 1 tablet (1,000 mg total) by mouth daily. 12/09/20 01/08/21  Orlie Pollen, CNM    No Known Allergies  No family history on file.  Social History Social History   Tobacco Use   Smoking status: Never Smoker   Smokeless tobacco: Never Used  Substance Use Topics   Alcohol use: Yes    Comment: occ   Drug use: No    Review of Systems Constitutional: Negative for fever. Cardiovascular: Left chest pain x2 days Respiratory: Negative for shortness of breath. Gastrointestinal: Negative for abdominal pain, vomiting and diarrhea. Genitourinary: Currently on menstrual cycle. Musculoskeletal: Negative for musculoskeletal complaints Neurological: Negative for headache.  States mild weakness in the left hand only. All other ROS negative  ____________________________________________   PHYSICAL EXAM:  VITAL SIGNS: ED Triage Vitals  Enc Vitals Group     BP 12/29/20 1801 105/77     Pulse Rate 12/29/20 1801 86     Resp 12/29/20 1801 18     Temp 12/29/20 1801 97.7 F (36.5 C)     Temp Source 12/29/20 1801 Oral     SpO2 12/29/20 1801  98 %     Weight 12/29/20 1803 145 lb (65.8 kg)     Height 12/29/20 1803 5\' 7"  (1.702 m)     Head Circumference --      Peak Flow --      Pain Score 12/29/20 1802 6     Pain Loc --      Pain Edu? --      Excl. in [place]? --     Constitutional: Alert and oriented. Well appearing and in no distress. Eyes: Normal exam ENT      Head: Normocephalic and atraumatic.      Mouth/Throat: Mucous membranes are moist. Cardiovascular: Normal rate, regular rhythm. Respiratory: Normal respiratory effort without tachypnea nor retractions. Breath sounds are  clear Gastrointestinal: Soft and nontender. No distention.  Musculoskeletal: Nontender with normal range of motion in all extremities. No lower extremity tenderness or edema. Neurologic:  Normal speech and language.  Slightly diminished grip strength left upper extremity however remainder of the arm is 5/5 strength able to pull herself up with her left arm.  No facial droop.  No lower extremity symptoms.  No sensory deficits. Skin:  Skin is warm, dry and intact.  Psychiatric: Mood and affect are normal. Speech and behavior are normal.   ____________________________________________    EKG  EKG viewed and interpreted by myself shows a normal sinus rhythm 82 bpm with a narrow QRS, normal axis, normal intervals, no concerning ST changes.  ____________________________________________    RADIOLOGY  Chest x-ray is negative CT head negative CTA negative  ____________________________________________   INITIAL IMPRESSION / ASSESSMENT AND PLAN / ED COURSE  Pertinent labs & imaging results that were available during my care of the patient were reviewed by me and considered in my medical decision making (see chart for details).   Patient presents to the emergency department for left-sided chest pain which has been intermittent over the past 2 days but worse today.  States she had a syncopal versus near syncopal episode upon standing.  Patient also states she feels like her left hand is weak.  No headache.  On my examination patient has slightly diminished grip strength in the left upper extremity however remainder of her strength is intact and able to pull herself up with her left arm.  CT scan head was negative.  No other findings on physical exam does have mild tenderness to the left chest.  Patient's main complaint/concern is pain to the left chest and her near syncopal episode.  Patient's labs are reassuring including negative troponin x2.  D-dimer is slightly elevated however CTA negative for  PE.  Overall patient appears well.  States a history of syncope in the past.  Reassuring vital signs reassuring physical exam overall reassuring work-up.  Patient will follow up with her PCP.  Patient reassured by work-up.  Latoya Luna was evaluated in Emergency Department on 12/29/2020 for the symptoms described in the history of present illness. She was evaluated in the context of the global COVID-19 pandemic, which necessitated consideration that the patient might be at risk for infection with the SARS-CoV-2 virus that causes COVID-19. Institutional protocols and algorithms that pertain to the evaluation of patients at risk for COVID-19 are in a state of rapid change based on information released by regulatory bodies including the CDC and federal and state organizations. These policies and algorithms were followed during the patient's care in the ED.  ____________________________________________   FINAL CLINICAL IMPRESSION(S) / ED DIAGNOSES  Chest pain   Keyleigh Manninen,  Lennette Bihari, MD 12/30/20 [phone]

## 2020-12-30 ENCOUNTER — Other Ambulatory Visit: Payer: Self-pay

## 2020-12-30 LAB — HCG, QUANTITATIVE, PREGNANCY: hCG, Beta Chain, Quant, S: <1 m[IU]/mL (ref <5)

## 2020-12-30 LAB — CBG MONITORING, ED: Glucose-Capillary: 86 mg/dL (ref 70–99)

## 2020-12-30 LAB — MAGNESIUM: Magnesium: 2.3 mg/dL (ref 1.7–2.4)

## 2020-12-30 MED ORDER — ONDANSETRON HCL 4 MG/2ML IJ SOLN
4.0000 mg | Freq: Once | INTRAMUSCULAR | Status: AC
  Administered 2020-12-30: 4 mg via INTRAVENOUS
  Filled 2020-12-30: qty 2

## 2020-12-30 MED ORDER — ONDANSETRON 4 MG PO TBDP: 4.0000 mg | ORAL_TABLET | Freq: Once | ORAL | Status: DC

## 2020-12-30 MED ORDER — ACETAMINOPHEN 500 MG PO TABS
1000.0000 mg | ORAL_TABLET | Freq: Once | ORAL | Status: AC
  Administered 2020-12-30: 1000 mg via ORAL
  Filled 2020-12-30: qty 2

## 2020-12-30 MED ORDER — SODIUM CHLORIDE 0.9 % IV BOLUS (SEPSIS)
1000.0000 mL | Freq: Once | INTRAVENOUS | Status: AC
  Administered 2020-12-30: 1000 mL via INTRAVENOUS

## 2020-12-30 NOTE — ED Notes (Addendum)
This RN was standing next to patient at bedside obtaining orthostatic vital signs when patient's eyes closed and patient fell on her right side on the floor, hitting head. This RN attempted to catch patient under arms without success. This RN held patient's head off floor for approximately one minute before patient woke up. Raquel charge RN, assisted this RN in helping patient slowly stand up and get back into bed. Patient states "I'm so sorry about that I think I just got dizzy and fell." when asked if anything hurts patient states "No just my head a little bit but I don't think I have a goose egg or anything". MD Ward at bedside to evaluate patient at this time. New orders placed for repeat EKG, CBG, saline bolus, tylenol for headache, and zofran for pre-existing nausea.

## 2020-12-30 NOTE — ED Notes (Signed)
Patient back from CT. Walked down hallway to bathroom w/o need for assistance. Steady gait noted.

## 2020-12-30 NOTE — Discharge Instructions (Addendum)
I recommend that you increase your fluid intake at home and rest for the next several days.  Your cardiac work-up today was very reassuring.  Your blood counts, electrolytes, blood glucose were normal.  You did not have any sign of blood clots on your CT scan.  Your pregnancy test was negative.  I recommend that she follow-up closely with your cardiologist as an outpatient.  When you get up from a seated or flat position please do so very slowly and stand for several seconds before you begin walking to ensure that she did not feel dizzy.

## 2021-01-27 ENCOUNTER — Other Ambulatory Visit: Payer: Self-pay | Admitting: Obstetrics and Gynecology

## 2021-01-27 ENCOUNTER — Encounter: Payer: Self-pay | Admitting: Obstetrics and Gynecology

## 2021-01-27 DIAGNOSIS — F419 Anxiety disorder, unspecified: Secondary | ICD-10-CM

## 2021-01-27 MED ORDER — CLONAZEPAM 1 MG PO TABS: 1.0000 mg | ORAL_TABLET | Freq: Two times a day (BID) | ORAL | 0 refills | Status: DC | PRN

## 2021-01-27 NOTE — Progress Notes (Signed)
Rx RF clonazepam till 3/22 annual. Pt to take sparingly

## 2021-02-24 ENCOUNTER — Ambulatory Visit: Payer: BC Managed Care – PPO | Admitting: Obstetrics and Gynecology

## 2021-02-26 ENCOUNTER — Ambulatory Visit: Payer: BC Managed Care – PPO | Admitting: Obstetrics and Gynecology

## 2021-02-26 DIAGNOSIS — Z1371 Encounter for nonprocreative screening for genetic disease carrier status: Secondary | ICD-10-CM

## 2021-02-26 DIAGNOSIS — Z9189 Other specified personal risk factors, not elsewhere classified: Secondary | ICD-10-CM

## 2021-02-26 HISTORY — DX: Encounter for nonprocreative screening for genetic disease carrier status: Z13.71

## 2021-02-26 HISTORY — DX: Other specified personal risk factors, not elsewhere classified: Z91.89

## 2021-03-09 ENCOUNTER — Ambulatory Visit (INDEPENDENT_AMBULATORY_CARE_PROVIDER_SITE_OTHER): Payer: BC Managed Care – PPO | Admitting: Obstetrics and Gynecology

## 2021-03-09 ENCOUNTER — Encounter: Payer: Self-pay | Admitting: Obstetrics and Gynecology

## 2021-03-09 ENCOUNTER — Other Ambulatory Visit (HOSPITAL_COMMUNITY)
Admission: RE | Admit: 2021-03-09 | Discharge: 2021-03-09 | Disposition: A | Discharge status: Home / Self Care | Payer: BC Managed Care – PPO | Source: Ambulatory Visit | Attending: Obstetrics and Gynecology | Admitting: Obstetrics and Gynecology

## 2021-03-09 ENCOUNTER — Other Ambulatory Visit: Payer: Self-pay

## 2021-03-09 VITALS — BP 128/70 | Ht 67.0 in | Wt 151.6 lb

## 2021-03-09 DIAGNOSIS — Z124 Encounter for screening for malignant neoplasm of cervix: Secondary | ICD-10-CM

## 2021-03-09 DIAGNOSIS — Z113 Encounter for screening for infections with a predominantly sexual mode of transmission: Secondary | ICD-10-CM

## 2021-03-09 DIAGNOSIS — Z0000 Encounter for general adult medical examination without abnormal findings: Secondary | ICD-10-CM | POA: Diagnosis not present

## 2021-03-09 DIAGNOSIS — B009 Herpesviral infection, unspecified: Secondary | ICD-10-CM

## 2021-03-09 DIAGNOSIS — Z01419 Encounter for gynecological examination (general) (routine) without abnormal findings: Secondary | ICD-10-CM

## 2021-03-09 DIAGNOSIS — Z1379 Encounter for other screening for genetic and chromosomal anomalies: Secondary | ICD-10-CM

## 2021-03-09 DIAGNOSIS — Z3041 Encounter for surveillance of contraceptive pills: Secondary | ICD-10-CM

## 2021-03-10 LAB — RPR: RPR Ser Ql: NONREACTIVE

## 2021-03-10 LAB — HIV ANTIBODY (ROUTINE TESTING W REFLEX): HIV Screen 4th Generation wRfx: NONREACTIVE

## 2021-03-11 LAB — CYTOLOGY - PAP
Chlamydia: NEGATIVE
Comment: NEGATIVE
Comment: NEGATIVE
Comment: NEGATIVE
Comment: NORMAL
Diagnosis: NEGATIVE
Diagnosis: REACTIVE
High risk HPV: NEGATIVE
Neisseria Gonorrhea: NEGATIVE
Trichomonas: NEGATIVE

## 2021-03-15 ENCOUNTER — Encounter: Payer: Self-pay | Admitting: Obstetrics and Gynecology

## 2021-03-15 NOTE — Progress Notes (Signed)
Gynecology Annual Luna   PCP: [facility], [place]  Chief Complaint:  Chief Complaint  Patient presents with  . Latoya Luna    History of Present Illness: Patient is a 35 y.o. Latoya Luna presents for annual Luna. The patient reports a increase in headaches over the last six months. She states that she has a significant family history of migraines and has previously experienced relief from her headache symptoms with Immitrex, borrowing a family members rx. She reports that no other OTC medication options have helped with her headache symptoms in the past. 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 Additionally, we followed up today regarding the patient's concern for BTB with her OCP. [name] but reports recently taking a break from this medication, she additionally reports utilizing Plan B once in the last month. Given her recent changes in OCP medication she reports irregular cycles but anticipates a menstural cycle soon based on her current symptoms of bloating.   LMP: Patient's last menstrual period was 01/27/2021. Average Interval: irregular, 28 days Duration of flow: 4-5 days Heavy Menses: no Clots: no Intermenstrual Bleeding: yes Postcoital Bleeding: no Dysmenorrhea: no   The patient is sexually active. She currently has taken a brief break from [facility] for contraception, she utilized Plan B in the interim. She denies dyspareunia.  There is notable family history of breast or ovarian cancer in her family (paternal grandmother and aunt). Genetic screening offered and accepted.  The patient wears seatbelts: yes.   The patient has regular exercise: yes.    The patient denies current symptoms of depression.    Review of Systems: Review of Systems  All other systems reviewed and are negative.   Past Medical History:   Patient Active Problem List   Diagnosis Date Noted  . Encounter for surveillance of contraceptive pills 12/09/2020  . Anxiety 05/05/2020  . 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 Episode of syncope 11/04/2014    Overview:  Had triage visit for syncope episode around 21 weeks leading to abdominal trauma. No other etiology identified other than vasovagal syncope complicated by hypotension   . 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 History of seizure 09/04/2014    Formatting of this note might be different from the original. 5.5 yrs ago: no meds required     Past Surgical History:  Past Surgical History:  Procedure Laterality Date  . BREAST SURGERY      Latoya History:  Patient's last menstrual period was 01/27/2021. Contraception: OCP (estrogen/progesterone) Last Pap: 01/2020 Results were: NIL and HR HPV negative   Obstetric History: [id number]  Family History:  History reviewed. No pertinent family history.  Social History:  Social History   Socioeconomic History  . Marital status: Single    Spouse name: Not on file  . Number of children: Not on file  . Years of education: Not on file  . Highest education level: Not on file  Occupational History  . Not on file  Tobacco Use  . Smoking status: Never Smoker  . Smokeless tobacco: Never Used  Substance and Sexual Activity  . Alcohol use: Yes    Comment: occ  . Drug use: No  . Sexual activity: Yes    Comment: partner had vasectomy  Other Topics Concern  .  Not on file  Social History Narrative  . Not on file   Social Determinants of Health   Financial Resource Strain: Not on file  Food Insecurity: Not on file  Transportation Needs: Not on file  Physical Activity: Not on file  Stress: Not on file  Social Connections: Not on file  Intimate Partner Violence: Not on file    Allergies:  No Known Allergies  Medications: Prior to Admission medications   Medication Sig  Start Date End Date Taking? Authorizing Provider  clonazePAM (KLONOPIN) 1 MG tablet Take 1 tablet (1 mg total) by mouth 2 (two) times daily as needed for anxiety. 07/03/87  Yes Copland, Deirdre Evener, PA-C  Multiple Vitamin (MULTIVITAMIN WITH MINERALS) TABS tablet Take 1 tablet by mouth daily.   Yes [provider]  norgestimate-ethinyl estradiol ([place] 28) 0.25-35 MG-MCG tablet Take 1 tablet by mouth daily. 12/09/20 11/10/21 Yes Estephanie Hubbs, CNM  TRI-SPRINTEC 0.18/0.215/0.25 MG-35 MCG tablet Take 1 tablet by mouth daily. 02/20/20  Yes Rod Can, CNM    Physical Luna Vitals: Blood pressure 128/70, height _0  (1.702 m), weight 151 lb 9.6 oz (68.8 kg), last menstrual period 01/27/2021.  General: NAD HEENT: normocephalic, anicteric Thyroid: no enlargement, no palpable nodules Pulmonary: No increased work of breathing, CTAB Cardiovascular: RRR, distal pulses 2+ Breast: Breast symmetrical, no tenderness, no palpable nodules or masses, no skin or nipple retraction present, no nipple discharge.  No axillary or supraclavicular lymphadenopathy. Abdomen: NABS, soft, non-tender, non-distended.  Umbilicus without lesions.  No hepatomegaly, splenomegaly or masses palpable. No evidence of hernia  Genitourinary:  External: Normal external female genitalia.  Normal urethral meatus, normal Bartholin's and Skene's glands.    Vagina: Normal vaginal mucosa, no evidence of prolapse.    Cervix: Grossly normal in appearance, no bleeding  Bimanual Luna deferred  Rectal: deferred  Lymphatic: no evidence of inguinal lymphadenopathy Extremities: no edema, erythema, or tenderness Neurologic: Grossly intact Psychiatric: mood appropriate, affect full  Assessment: 35 y.o. G3P2002 routine annual Luna  Plan: Problem List Items Addressed This Visit   None   Visit Diagnoses    Screening for cervical cancer    -  Primary   Relevant Orders   Cytology - PAP (Completed)   Screen for sexually transmitted  diseases       Relevant Orders   Cytology - PAP (Completed)   HIV Antibody (routine testing w rflx) (Completed)   RPR (Completed)   HSV-1 (herpes simplex virus 1) infection       Routine health maintenance       Encounter for gynecological examination without abnormal finding       Surveillance for birth control, oral contraceptives       Genetic screening          1) STI screening  was offered and accepted  2)  ASCCP guidelines and rational discussed.  Patient opts for yearly screening interval - Based on today's results patient plans to opt for screening on a five year interval basis  3) Contraception - the patient is currently using  OCP (estrogen/progesterone).  She is happy with her current form of contraception and plans to continue - patient has taken a recent "break" from OCP administration, plans to restart shortly - will follow-up if concerns regarding BTB persist  4) Headaches - patient plans to establish care with [facility] for primary care, will defer tx for headaches to PCP - patient states she has seen neurologists in the past for hx of szs,  declines referral at this time  5) Myriad MyRisk genetic screening offered and accepted during today's visit. Labs collected.   6) Return in about 1 year (around 03/09/2022).   Orlie Pollen, CNM, MSN [facility], [facility] 03/15/2021, 10:55 AM

## 2021-04-21 ENCOUNTER — Other Ambulatory Visit: Payer: Self-pay

## 2021-04-21 ENCOUNTER — Emergency Department: Payer: BC Managed Care – PPO

## 2021-04-21 ENCOUNTER — Emergency Department
Admission: EM | Admit: 2021-04-21 | Discharge: 2021-04-21 | Disposition: A | Discharge status: Home / Self Care | Payer: BC Managed Care – PPO | Attending: Emergency Medicine | Admitting: Emergency Medicine

## 2021-04-21 DIAGNOSIS — Z8541 Personal history of malignant neoplasm of cervix uteri: Secondary | ICD-10-CM | POA: Insufficient documentation

## 2021-04-21 DIAGNOSIS — Z20822 Contact with and (suspected) exposure to covid-19: Secondary | ICD-10-CM | POA: Insufficient documentation

## 2021-04-21 DIAGNOSIS — J209 Acute bronchitis, unspecified: Secondary | ICD-10-CM | POA: Insufficient documentation

## 2021-04-21 DIAGNOSIS — J069 Acute upper respiratory infection, unspecified: Secondary | ICD-10-CM | POA: Insufficient documentation

## 2021-04-21 DIAGNOSIS — R059 Cough, unspecified: Secondary | ICD-10-CM | POA: Diagnosis present

## 2021-04-21 LAB — RESP PANEL BY RT-PCR (FLU A&B, COVID) ARPGX2
Influenza A by PCR: NEGATIVE
Influenza B by PCR: NEGATIVE
SARS Coronavirus 2 by RT PCR: NEGATIVE

## 2021-04-21 MED ORDER — BENZONATATE 100 MG PO CAPS: 100.0000 mg | ORAL_CAPSULE | Freq: Three times a day (TID) | ORAL | 0 refills | Status: AC | PRN

## 2021-04-21 MED ORDER — ACETAMINOPHEN 325 MG PO TABS
650.0000 mg | ORAL_TABLET | Freq: Once | ORAL | Status: AC
  Administered 2021-04-21: 650 mg via ORAL
  Filled 2021-04-21: qty 2

## 2021-04-21 MED ORDER — LIDOCAINE 5 % EX PTCH
1.0000 | MEDICATED_PATCH | CUTANEOUS | Status: DC
  Administered 2021-04-21: 1 via TRANSDERMAL
  Filled 2021-04-21: qty 1

## 2021-04-21 MED ORDER — MELOXICAM 7.5 MG PO TABS
15.0000 mg | ORAL_TABLET | Freq: Once | ORAL | Status: AC
  Administered 2021-04-21: 15 mg via ORAL
  Filled 2021-04-21: qty 2

## 2021-04-21 MED ORDER — BENZONATATE 100 MG PO CAPS
100.0000 mg | ORAL_CAPSULE | Freq: Once | ORAL | Status: AC
  Administered 2021-04-21: 100 mg via ORAL
  Filled 2021-04-21: qty 1

## 2021-04-21 MED ORDER — MELOXICAM 15 MG PO TABS: 15.0000 mg | ORAL_TABLET | Freq: Every day | ORAL | 0 refills | Status: AC

## 2021-04-21 NOTE — ED Triage Notes (Signed)
Pt to ED for cough x1 week since going to [place] (daughter being seen for same) NAD noted, RR Even and unlabored.  Denies known covid exposure

## 2021-04-21 NOTE — ED Provider Notes (Signed)
[facility] Emergency Department Provider Note  ____________________________________________   Event Date/Time   First MD Initiated Contact with Patient 04/21/21 1913     (approximate)  I have reviewed the triage vital signs and the nursing notes.   HISTORY  Chief Complaint Cough  HPI Latoya Luna is a 35 y.o. female who presents to the emergency department for evaluation of cough that began after a recent trip with family to [facility].  Cough began about 7 or 8 days ago.  She denies any fever associated with this.  She also reports that she was on a boat 3 days ago and slipped, falling onto her right chest wall.  She reports pain along her ribs under the right axilla that is worsened with cough.  She denies any nausea, vomiting, abdominal pain or other related symptoms.  She has not had any relief with attempted Delsym.       Past Medical History:  Diagnosis Date  . Anxiety   . 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 Depression   . Seizures ([place])    last seizure 5 years ago    Patient Active Problem List   Diagnosis Date Noted  . Encounter for surveillance of contraceptive pills 12/09/2020  . Anxiety 05/05/2020  . 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 Episode of syncope 11/04/2014  . 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 History of seizure 09/04/2014    Past Surgical History:  Procedure Laterality Date  . BREAST SURGERY      Prior to Admission medications   Medication Sig Start Date End Date Taking? Authorizing Provider  benzonatate (TESSALON PERLES) 100 MG capsule Take 1 capsule (100 mg total) by mouth 3 (three) times daily as needed for cough. 04/21/21 04/21/22 Yes Leonid Manus, Farrel Gordon, PA  meloxicam (MOBIC) 15 MG tablet Take 1 tablet (15 mg total) by mouth daily for 15 days. 04/21/21 05/06/21 Yes Julieann Drummonds, Farrel Gordon, PA  clonazePAM (KLONOPIN) 1 MG tablet Take 1 tablet (1 mg total) by mouth 2 (two) times daily as needed for anxiety. 12/03/08    Copland, Deirdre Evener, PA-C  Multiple Vitamin (MULTIVITAMIN WITH MINERALS) TABS tablet Take 1 tablet by mouth daily.    [provider]  norgestimate-ethinyl estradiol ([place] 28) 0.25-35 MG-MCG tablet Take 1 tablet by mouth daily. 12/09/20 11/10/21  Orlie Pollen, CNM  TRI-SPRINTEC 0.18/0.215/0.25 MG-35 MCG tablet Take 1 tablet by mouth daily. 02/20/20   Rod Can, CNM    Allergies Patient has no known allergies.  Family History  Problem Relation Age of Onset  . Breast cancer Paternal Grandmother 17  . Breast cancer Paternal Aunt 8    Social History Social History   Tobacco Use  . Smoking status: Never Smoker  . Smokeless tobacco: Never Used  Substance Use Topics  . Alcohol use: Yes    Comment: occ  . Drug use: No    Review of Systems Constitutional: No fever/chills Eyes: No visual changes. ENT: No sore throat. Cardiovascular: Denies chest pain. Respiratory: + Cough, denies shortness of breath. Gastrointestinal: No abdominal pain.  No nausea, no vomiting.  No diarrhea.  No constipation. Genitourinary: Negative for dysuria. Musculoskeletal: Negative for back pain. Skin: Negative for rash. Neurological: Negative for headaches, focal weakness or numbness.   ____________________________________________   PHYSICAL EXAM:  VITAL SIGNS: ED Triage Vitals  Enc Vitals Group     BP 04/21/21 1758 (!) 119/93     Pulse Rate 04/21/21 1758 84  Resp 04/21/21 1758 20     Temp 04/21/21 1758 98.3 F (36.8 C)     Temp Source 04/21/21 1758 Oral     SpO2 04/21/21 1758 100 %     Weight 04/21/21 1759 145 lb (65.8 kg)     Height 04/21/21 1759 5\' 7"  (1.702 m)     Head Circumference --      Peak Flow --      Pain Score 04/21/21 1759 0     Pain Loc --      Pain Edu? --      Excl. in [place]? --     Constitutional: Alert and oriented. Well appearing and in no acute distress. Eyes: Conjunctivae are normal. PERRL. EOMI. Head: Atraumatic. Nose: No  congestion/rhinnorhea. Mouth/Throat: Mucous membranes are moist.  Oropharynx non-erythematous. Neck: No stridor.   Cardiovascular: No chest wall ecchymosis.  There is tenderness to palpation of the right lateral chest wall, particularly under the axilla.  No deformity, crepitus noted.  Normal rate, regular rhythm. Grossly normal heart sounds.  Good peripheral circulation. Respiratory: Normal respiratory effort.  No retractions. Lungs CTAB. Gastrointestinal: Soft and nontender. No distention. No abdominal bruits. No CVA tenderness. Musculoskeletal: No lower extremity tenderness nor edema.  No joint effusions. Neurologic:  Normal speech and language. No gross focal neurologic deficits are appreciated. No gait instability. Skin:  Skin is warm, dry and intact. No rash noted. Psychiatric: Mood and affect are normal. Speech and behavior are normal.  ____________________________________________   LABS (all labs ordered are listed, but only abnormal results are displayed)  Labs Reviewed  RESP PANEL BY RT-PCR (FLU A&B, COVID) [id number]    ____________________________________________  RADIOLOGY I, Marlana Salvage, personally viewed and evaluated these images (plain radiographs) as part of my medical decision making, as well as reviewing the written report by the radiologist.  ED provider interpretation: No discrete rib fracture, no focal pneumonia  Official radiology report(s): DG Ribs Unilateral W/Chest Right  Result Date: 04/21/2021 CLINICAL DATA:  Right rib pain after fall, cough EXAM: RIGHT RIBS AND CHEST - 3+ VIEW COMPARISON:  Radiograph 12/29/2020, CT 12/29/2020 FINDINGS: No visible displaced rib fracture or other acute traumatic abnormality of the chest wall. Bilateral breast prostheses contribute to increased attenuation of the lung bases. No consolidation, features of edema, pneumothorax, or effusion. Pulmonary vascularity is normally distributed. The cardiomediastinal contours are  unremarkable. IMPRESSION: No visible displaced rib fracture nor other traumatic or acute cardiopulmonary abnormality Electronically Signed   By: Lovena Le M.D.   On: 04/21/2021 20:03    ____________________________________________   INITIAL IMPRESSION / ASSESSMENT AND PLAN / ED COURSE  As part of my medical decision making, I reviewed the following data within the [facility] notes reviewed and incorporated, Radiograph reviewed and Notes from prior ED visits        Patient is a 57 female who presents to the emergency department for evaluation of cough as well as right lateral chest wall pain after recent fall, see HPI for further details.  In triage, patient has normal vital signs.  On physical exam she does have tenderness over the right lateral chest wall under the axilla area without deformity or crepitus noted.  Breath sounds are within normal limits, other physical exam findings are grossly within normal limits.  Chest x-ray was obtained without any discrete noted rib fracture, no pneumonia, pneumothorax or other acute finding.  Respiratory panel was obtained and is negative for COVID or flu.  Recommended symptomatic  treatment of the cough with Tessalon Perles as well as anti-inflammatory for suspected viral bronchitis.  Also recommended symptomatic treatment of the right chest wall contusion with anti-inflammatories, lidocaine patch and Tylenol.  Patient is amenable with this plan, return precautions were discussed he stable this time for outpatient management.      ____________________________________________   FINAL CLINICAL IMPRESSION(S) / ED DIAGNOSES  Final diagnoses:  Viral URI with cough  Acute bronchitis, unspecified organism     ED Discharge Orders         Ordered    meloxicam (MOBIC) 15 MG tablet  Daily        04/21/21 2104    benzonatate (TESSALON PERLES) 100 MG capsule  3 times daily PRN        04/21/21 2104          *Please note:   Latoya Luna was evaluated in Emergency Department on 04/21/2021 for the symptoms described in the history of present illness. She was evaluated in the context of the global COVID-19 pandemic, which necessitated consideration that the patient might be at risk for infection with the SARS-CoV-2 virus that causes COVID-19. Institutional protocols and algorithms that pertain to the evaluation of patients at risk for COVID-19 are in a state of rapid change based on information released by regulatory bodies including the CDC and federal and state organizations. These policies and algorithms were followed during the patient's care in the ED.  Some ED evaluations and interventions may be delayed as a result of limited staffing during and the pandemic.*   Note:  This document was prepared using Dragon voice recognition software and may include unintentional dictation errors.   Marlana Salvage, PA 04/21/21 2330    Naaman Plummer, MD 04/22/21 [phone]

## 2021-04-21 NOTE — Discharge Instructions (Addendum)
Please take anti-inflammatory as prescribed.  You have also been prescribed Tessalon Perles for your cough.  Please also use lidocaine patches that are available over-the-counter at your pharmacy as well as Tylenol, up to 1000 mg 4 times daily as needed.  Return to the emergency department for any fevers or other acute worsening.

## 2021-06-18 ENCOUNTER — Other Ambulatory Visit: Payer: Self-pay | Admitting: Obstetrics and Gynecology

## 2021-06-18 DIAGNOSIS — B009 Herpesviral infection, unspecified: Secondary | ICD-10-CM

## 2021-07-16 ENCOUNTER — Other Ambulatory Visit: Payer: Self-pay | Admitting: Obstetrics and Gynecology

## 2021-07-16 DIAGNOSIS — B009 Herpesviral infection, unspecified: Secondary | ICD-10-CM

## 2021-08-09 ENCOUNTER — Other Ambulatory Visit: Payer: Self-pay | Admitting: Obstetrics and Gynecology

## 2021-08-09 DIAGNOSIS — B009 Herpesviral infection, unspecified: Secondary | ICD-10-CM

## 2021-08-10 ENCOUNTER — Telehealth: Payer: BC Managed Care – PPO

## 2021-08-10 NOTE — Telephone Encounter (Signed)
Pt calling; has been waiting on rx refill; needs it today.  [phone]  Called pt to see what rx she is referring to; pt states valtrex; adv it was refilled yesterday.

## 2021-11-01 ENCOUNTER — Other Ambulatory Visit: Payer: Self-pay

## 2021-11-01 DIAGNOSIS — Z3041 Encounter for surveillance of contraceptive pills: Secondary | ICD-10-CM

## 2021-11-01 MED ORDER — NORGESTIMATE-ETH ESTRADIOL 0.25-35 MG-MCG PO TABS: 1.0000 | ORAL_TABLET | Freq: Every day | ORAL | 0 refills | Status: DC

## 2021-11-01 NOTE — Telephone Encounter (Signed)
Pt calling for bc refill; pharm hasn't gotten a response from [place]; is up to date on annual/pap.  [phone] Pt aware refill eRx'd.

## 2022-02-24 ENCOUNTER — Emergency Department
Admission: EM | Admit: 2022-02-24 | Discharge: 2022-02-24 | Disposition: A | Discharge status: Home / Self Care | Payer: Medicaid Other | Attending: Emergency Medicine | Admitting: Emergency Medicine

## 2022-02-24 ENCOUNTER — Emergency Department: Payer: Medicaid Other

## 2022-02-24 ENCOUNTER — Other Ambulatory Visit: Payer: Self-pay

## 2022-02-24 DIAGNOSIS — M79604 Pain in right leg: Secondary | ICD-10-CM | POA: Insufficient documentation

## 2022-02-24 NOTE — ED Triage Notes (Signed)
Pt c/o pain and swelling in the right upper leg for the past week and concerned about DVT.Latoya Luna ?

## 2022-02-24 NOTE — Discharge Instructions (Signed)
Please seek medical attention for any high fevers, chest pain, shortness of breath, change in behavior, persistent vomiting, bloody stool or any other new or concerning symptoms.  

## 2022-02-24 NOTE — ED Provider Notes (Signed)
? ?[facility] ?Provider Note ? ? ? Event Date/Time  ? First MD Initiated Contact with Patient 02/24/22 [phone]   ?  (approximate) ? ? ?History  ? ?Leg Pain ? ? ?HPI ? ?Latoya Luna is a 36 y.o. female  who, per cardiology report dated 06/28/2021 has history of chest pain and recurrent syncope, who presents to the emergency department today because of concerns for right upper leg swelling and discomfort.  The patient states that she first noticed some issues about 2 weeks ago.  She did have a rash overlying that area.  She then noticed some swelling to discomfort.  She feels since then it has somewhat come and gone.  It is associated with some warmth.  Patient denies any trauma.  She states she recently got back to the gym but this started occurring prior to her going to the gym.  She denies any other swelling in her legs. Denies any fevers. Feels like she might be coming down with a cold.  ? ? ?Physical Exam  ? ?Triage Vital Signs: ?ED Triage Vitals  ?Enc Vitals Group  ?   BP 02/24/22 0938 119/84  ?   Pulse Rate 02/24/22 0938 87  ?   Resp 02/24/22 0938 17  ?   Temp 02/24/22 0938 98 ?F (36.7 ?C)  ?   Temp Source 02/24/22 0938 Oral  ?   SpO2 02/24/22 0938 100 %  ?   Weight --   ?   Height --   ?   Head Circumference --   ?   Peak Flow --   ?   Pain Score 02/24/22 0936 6  ? ?Most recent vital signs: ?Vitals:  ? 02/24/22 0938  ?BP: 119/84  ?Pulse: 87  ?Resp: 17  ?Temp: 98 ?F (36.7 ?C)  ?SpO2: 100%  ? ? ?General: Awake, no distress.  ?CV:  Good peripheral perfusion.  ?Resp:  Normal effort.  ?Abd:  No distention.  ?Right lower extreimtye:  No rash, no discoloration. No fluctuance. No significant tenderness. ? ? ?ED Results / Procedures / Treatments  ? ?Labs ?(all labs ordered are listed, but only abnormal results are displayed) ?Labs Reviewed - No data to display ? ? ?EKG ? ?None ? ? ?RADIOLOGY ?I independently interpreted and visualized the [place]. My interpretation: No DVT ?Radiology interpretation:  ?   ?IMPRESSION:  ?No evidence of right lower extremity deep venous thrombosis.  ?   ? ?PROCEDURES: ? ?Critical Care performed: No ? ?Procedures ? ? ?MEDICATIONS ORDERED IN ED: ?Medications - No data to display ? ? ?IMPRESSION / MDM / ASSESSMENT AND PLAN / ED COURSE  ?I reviewed the triage vital signs and the nursing notes. ?             ?               ? ?Differential diagnosis includes, but is not limited to, musculoskeletal inflammation, DVT, hematoma. ? ?Patient presented to the emergency department today because of concerns for right leg pain.  On exam no overlying rash or skin color change.  No distal swelling.  No significant tenderness on exam.  Patient was concerned for blood clot and ultrasound was obtained.  This did not show any concerning findings.  This time I think likely muscular skeletal complaint.  I think is reasonable for patient to continue to observe.  Did discuss return precautions and concerning symptoms with patient. ? ? ?FINAL CLINICAL IMPRESSION(S) / ED DIAGNOSES  ? ?Final  diagnoses:  ?Right leg pain  ? ? ? ?Note:  This document was prepared using Dragon voice recognition software and may include unintentional dictation errors. ? ?  ?Nance Pear, MD ?02/24/22 1538 ? ?

## 2022-02-28 ENCOUNTER — Other Ambulatory Visit: Payer: Self-pay | Admitting: Obstetrics and Gynecology

## 2022-02-28 DIAGNOSIS — Z3041 Encounter for surveillance of contraceptive pills: Secondary | ICD-10-CM

## 2022-03-01 ENCOUNTER — Telehealth: Payer: Self-pay

## 2022-03-01 NOTE — Telephone Encounter (Signed)
Pt calling for refill of bc and other rx from [place] - can't remember the name.  [phone] Adv pt bc was refilled yesterday; asked if other medication was the klonopin or the valtrex.  Pt states valtrex.  Adv MMF not in office today but is in tomorrow and msg will be sent to her.  Pharm correct in chart. ?

## 2022-03-02 ENCOUNTER — Other Ambulatory Visit: Payer: Self-pay | Admitting: Obstetrics

## 2022-03-02 DIAGNOSIS — B009 Herpesviral infection, unspecified: Secondary | ICD-10-CM

## 2022-03-02 MED ORDER — VALACYCLOVIR HCL 1 G PO TABS: 1000.0000 mg | ORAL_TABLET | Freq: Every day | ORAL | 7 refills | Status: DC

## 2022-03-14 ENCOUNTER — Other Ambulatory Visit: Payer: Self-pay

## 2022-03-14 DIAGNOSIS — B009 Herpesviral infection, unspecified: Secondary | ICD-10-CM

## 2022-03-14 MED ORDER — VALACYCLOVIR HCL 1 G PO TABS: 1000.0000 mg | ORAL_TABLET | Freq: Every day | ORAL | 0 refills | Status: DC

## 2022-03-28 ENCOUNTER — Ambulatory Visit: Payer: Self-pay | Admitting: Obstetrics

## 2022-03-28 ENCOUNTER — Other Ambulatory Visit (HOSPITAL_COMMUNITY)
Admission: RE | Admit: 2022-03-28 | Discharge: 2022-03-28 | Disposition: A | Discharge status: Home / Self Care | Payer: Medicaid Other | Source: Ambulatory Visit | Attending: Obstetrics | Admitting: Obstetrics

## 2022-03-28 ENCOUNTER — Encounter: Payer: Self-pay | Admitting: Advanced Practice Midwife

## 2022-03-28 ENCOUNTER — Ambulatory Visit (INDEPENDENT_AMBULATORY_CARE_PROVIDER_SITE_OTHER): Payer: Medicaid Other | Admitting: Advanced Practice Midwife

## 2022-03-28 VITALS — BP 122/70 | Ht 67.0 in | Wt 148.0 lb

## 2022-03-28 DIAGNOSIS — B3731 Acute candidiasis of vulva and vagina: Secondary | ICD-10-CM | POA: Diagnosis not present

## 2022-03-28 DIAGNOSIS — F419 Anxiety disorder, unspecified: Secondary | ICD-10-CM

## 2022-03-28 DIAGNOSIS — Z30011 Encounter for initial prescription of contraceptive pills: Secondary | ICD-10-CM | POA: Diagnosis not present

## 2022-03-28 DIAGNOSIS — N898 Other specified noninflammatory disorders of vagina: Secondary | ICD-10-CM | POA: Insufficient documentation

## 2022-03-28 DIAGNOSIS — Z113 Encounter for screening for infections with a predominantly sexual mode of transmission: Secondary | ICD-10-CM

## 2022-03-28 DIAGNOSIS — Z01419 Encounter for gynecological examination (general) (routine) without abnormal findings: Secondary | ICD-10-CM | POA: Diagnosis not present

## 2022-03-28 MED ORDER — CLONAZEPAM 1 MG PO TABS: 1.0000 mg | ORAL_TABLET | Freq: Two times a day (BID) | ORAL | 0 refills | Status: DC | PRN

## 2022-03-28 MED ORDER — LEVONORGEST-ETH ESTRAD 91-DAY 0.15-0.03 MG PO TABS: 1.0000 | ORAL_TABLET | Freq: Every day | ORAL | 4 refills | Status: DC

## 2022-03-28 MED ORDER — FLUCONAZOLE 150 MG PO TABS: 150.0000 mg | ORAL_TABLET | Freq: Once | ORAL | 3 refills | Status: AC

## 2022-03-28 NOTE — Patient Instructions (Signed)
Managing Anxiety ?This video describes anxiety and what can be done to manage it. ?To view the content, go to this web address: ?[email or web address] ? ?This video will expire on: 02/15/2024. If you need access to this video following this date, please reach out to the healthcare provider who assigned it to you. ?This information is not intended to replace advice given to you by your health care provider. Make sure you discuss any questions you have with your health care provider. ?Elsevier Patient Education ? [place], Adult ?Feeling a certain amount of stress is normal. Stress helps our body and mind get ready to deal with the demands of life. Stress hormones can motivate you to do well at work and meet your responsibilities. But severe or long-term (chronic) stress can affect your mental and physical health. Chronic stress puts you at higher risk for: ?Anxiety and depression. ?Other health problems such as digestive problems, muscle aches, heart disease, high blood pressure, and stroke. ?What are the causes? ?Common causes of stress include: ?Demands from work, such as deadlines, feeling overworked, or having long hours. ?Pressures at home, such as money issues, disagreements with a spouse, or parenting issues. ?Pressures from major life changes, such as divorce, moving, loss of a loved one, or chronic illness. ?You may be at higher risk for stress-related problems if you: ?Do not get enough sleep. ?Are in poor health. ?Do not have emotional support. ?Have a mental health disorder such as anxiety or depression. ?How to recognize stress ?Stress can make you: ?Have trouble sleeping. ?Feel sad, anxious, irritable, or overwhelmed. ?Lose your appetite. ?Overeat or want to eat unhealthy foods. ?Want to use drugs or alcohol. ?Stress can also cause physical symptoms, such as: ?Sore, tense muscles, especially in the shoulders and neck. ?Headaches. ?Trouble breathing. ?A faster heart  rate. ?Stomach pain, nausea, or vomiting. ?Diarrhea or constipation. ?Trouble concentrating. ?Follow these instructions at home: ?Eating and drinking ?Eat a healthy diet. This includes: ?Eating foods that are high in fiber, such as beans, whole grains, and fresh fruits and vegetables. ?Limiting foods that are high in fat and processed sugars, such as fried or sweet foods. ?Do not skip meals or overeat. ?Drink enough fluid to keep your urine pale yellow. ?Alcohol use ?Do not drink alcohol if: ?Your health care provider tells you not to drink. ?You are pregnant, may be pregnant, or are planning to become pregnant. ?Drinking alcohol is a way some people try to ease their stress. This can be dangerous, so if you drink alcohol: ?Limit how much you have to: ?0-1 drink a day for women. ?0-2 drinks a day for men. ?Know how much alcohol is in your drink. In the [place] one drink equals one 12 oz bottle of beer (355 mL), one 5 oz glass of wine (148 mL), or one 1? oz glass of hard liquor (44 mL). ?Activity ? ?Include 30 minutes of exercise in your daily schedule. Exercise is a good stress reducer. ?Include time in your day for an activity that you find relaxing. Try taking a walk, going on a bike ride, reading a book, or listening to music. ?Schedule your time in a way that lowers stress, and keep a regular schedule. Focus on doing what is most important to get done. ?Lifestyle ?Identify the source of your stress and your reaction to it. See a therapist who can help you change unhelpful reactions. ?When there are stressful events: ?Talk about them with family, friends, or coworkers. ?Try to think  realistically about stressful events and not ignore them or overreact. ?Try to find the positives in a stressful situation and not focus on the negatives. ?Cut back on responsibilities at work and home, if possible. Ask for help from friends or family members if you need it. ?Find ways to manage stress, such as: ?Mindfulness,  meditation, or deep breathing. ?Yoga or tai chi. ?Progressive muscle relaxation. ?Spending time in nature. ?Doing art, playing music, or reading. ?Making time for fun activities. ?Spending time with family and friends. ?Get support from family, friends, or spiritual resources. ?General instructions ?Get enough sleep. Try to go to sleep and get up at about the same time every day. ?Take over-the-counter and prescription medicines only as told by your health care provider. ?Do not use any products that contain nicotine or tobacco. These products include cigarettes, chewing tobacco, and vaping devices, such as e-cigarettes. If you need help quitting, ask your health care provider. ?Do not use drugs or smoke to deal with stress. ?Keep all follow-up visits. This is important. ?Where to find support ?Talk with your health care provider about stress management or finding a support group. ?Find a therapist to work with you on your stress management techniques. ?Where to find more information ?National Alliance on Mental Illness: www.nami.org ?American Psychological Association: TVStereos.ch ?Contact a health care provider if: ?Your stress symptoms get worse. ?You are unable to manage your stress at home. ?You are struggling to stop using drugs or alcohol. ?Get help right away if: ?You may be a danger to yourself or others. ?You have any thoughts of death or suicide. ?Get help right awayif you feel like you may hurt yourself or others, or have thoughts about taking your own life. Go to your nearest emergency room or: ?Call 911. ?Call the [place] at ([phone])730-6652 or 988 in the [place] This is open 24 hours a day. ?Text the Crisis Text Line at 2140022656. ?Summary ?Feeling a certain amount of stress is normal, but severe or long-term (chronic) stress can affect your mental and physical health. ?Chronic stress can put you at higher risk for anxiety, depression, and other health problems such as digestive  problems, muscle aches, heart disease, high blood pressure, and stroke. ?You may be at higher risk for stress-related problems if you do not get enough sleep, are in poor health, lack emotional support, or have a mental health disorder such as anxiety or depression. ?Identify the source of your stress and your reaction to it. Try talking about stressful events with family, friends, or coworkers, finding a coping method, or getting support from spiritual resources. ?If you need more help, talk with your health care provider about finding a support group or a mental health therapist. ?This information is not intended to replace advice given to you by your health care provider. Make sure you discuss any questions you have with your health care provider. ?Document Revised: 06/10/2021 Document Reviewed: 06/08/2021 ?Elsevier Patient Education ? [place]. ? ?

## 2022-03-29 ENCOUNTER — Encounter: Payer: Self-pay | Admitting: Advanced Practice Midwife

## 2022-03-29 ENCOUNTER — Other Ambulatory Visit: Payer: Self-pay | Admitting: Advanced Practice Midwife

## 2022-03-29 DIAGNOSIS — B3731 Acute candidiasis of vulva and vagina: Secondary | ICD-10-CM

## 2022-03-29 DIAGNOSIS — B9689 Other specified bacterial agents as the cause of diseases classified elsewhere: Secondary | ICD-10-CM

## 2022-03-29 LAB — CERVICOVAGINAL ANCILLARY ONLY
Bacterial Vaginitis (gardnerella): POSITIVE — AB
Candida Glabrata: NEGATIVE
Candida Vaginitis: NEGATIVE
Chlamydia: NEGATIVE
Comment: NEGATIVE
Comment: NEGATIVE
Comment: NEGATIVE
Comment: NEGATIVE
Comment: NEGATIVE
Comment: NORMAL
Neisseria Gonorrhea: NEGATIVE
Trichomonas: NEGATIVE

## 2022-03-29 MED ORDER — FLUCONAZOLE 150 MG PO TABS: 150.0000 mg | ORAL_TABLET | Freq: Once | ORAL | 1 refills | Status: AC

## 2022-03-29 MED ORDER — METRONIDAZOLE 500 MG PO TABS: 500.0000 mg | ORAL_TABLET | Freq: Two times a day (BID) | ORAL | 0 refills | Status: AC

## 2022-03-29 NOTE — Progress Notes (Signed)
Rx's metronidazole and diflucan sent. Message to patient. ?

## 2022-03-29 NOTE — Progress Notes (Addendum)
? ? ? ?Gynecology Annual Exam   ?Date of Service: 03/28/2022 ? ?PCP: [facility] ? ?Chief Complaint:  ?Chief Complaint  ?Patient presents with  ? Gynecologic Exam  ? ? ?History of Present Illness: Patient is a 36 y.o. G3P3003 presents for annual exam. The patient has complaint today of bleeding for 2 weeks with her most recent period. She normally has monthly periods lasting 6-7 days of heavy flow. Her last period stopped as usual and then restarted for an additional week. She also mentions an increase in milky white discharge with odor- no itching or irritation.  ? ?She admits some changes in the past recent months; an increase in her exercise level, an increase in grief and stress due to the sudden death of her fiance in November 07, 2023, not sleeping well. She would like to try a different OCP. We discussed various options and she is interested in [facility]. She requests a refill of her Rx Klonopin to help her through occasional anxiety attacks- she has tried atarax in the past and it did not work.  ? ?LMP: Patient's last menstrual period was 03/09/2022 (within days). ? ?Clots: no ?Intermenstrual Bleeding: no ?Postcoital Bleeding: no ?Dysmenorrhea: no ? ?The patient is sexually active. She currently uses OCP (estrogen/progesterone) for contraception. She denies dyspareunia.  The patient does perform self breast exams.  There is notable family history of breast or ovarian cancer in her family. She is BRCA negative- see scan on 03/09/21. ? ?The patient wears seatbelts: yes.   The patient has regular exercise: she works out- cardio and weights- 4-6 days per week, she admits healthy diet and adequate hydration. She does not sleep well due to anxiety.   ? ?The patient denies current symptoms of depression.   ? ?Review of Systems: Review of Systems  ?Constitutional:  Negative for chills and fever.  ?HENT:  Negative for congestion, ear discharge, ear pain, hearing loss, sinus pain and sore throat.   ?Eyes:   Negative for blurred vision and double vision.  ?Respiratory:  Negative for cough, shortness of breath and wheezing.   ?Cardiovascular:  Negative for chest pain, palpitations and leg swelling.  ?Gastrointestinal:  Negative for abdominal pain, blood in stool, constipation, diarrhea, heartburn, melena, nausea and vomiting.  ?Genitourinary:  Negative for dysuria, flank pain, frequency, hematuria and urgency.  ?Musculoskeletal:  Negative for back pain, joint pain and myalgias.  ?Skin:  Negative for itching and rash.  ?Neurological:  Positive for dizziness, tingling and headaches. Negative for tremors, sensory change, speech change, focal weakness, seizures, loss of consciousness and weakness.  ?     Positive for numbness  ?Endo/Heme/Allergies:  Positive for environmental allergies. Does not bruise/bleed easily.  ?     Positive for vaginal bleeding  ?Psychiatric/Behavioral:  Positive for depression. Negative for hallucinations, memory loss, substance abuse and suicidal ideas. The patient is not nervous/anxious and does not have insomnia.   ?     Positive for anxiety  ? ?Past Medical History:  ?Patient Active Problem List  ? Diagnosis Date Noted  ? Encounter for surveillance of contraceptive pills 12/09/2020  ? Anxiety 05/05/2020  ? Herpes simplex 11/26/2014  ? 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  Overview:  ?5.5 yrs ago: no meds required ? ?  ? History of seizure 09/04/2014  ?  Formatting of this note might be different from the original. ?5.5 yrs ago: no meds required ? ?  ? ? ?Past Surgical History:  ?Past Surgical History:  ?Procedure Laterality  Date  ? BREAST SURGERY    ? ? ?Gynecologic History:  ?Patient's last menstrual period was 03/09/2022 (within days). ?Contraception: OCP (estrogen/progesterone) ?Last Pap: 2022 Results were: no abnormalities  ? ?Obstetric History: [id number] ? ?Family History:  ?Family History  ?Problem Relation Age of Onset  ? Prostate cancer Father 78  ? Breast cancer Paternal Aunt 53  ?  Breast cancer Paternal Grandmother 60  ? ? ?Social History:  ?Social History  ? ?Socioeconomic History  ? Marital status: Single  ?  Spouse name: Not on file  ? Number of children: Not on file  ? Years of education: Not on file  ? Highest education level: Not on file  ?Occupational History  ? Not on file  ?Tobacco Use  ? Smoking status: Never  ? Smokeless tobacco: Never  ?Substance and Sexual Activity  ? Alcohol use: Yes  ?  Comment: occ  ? Drug use: No  ? Sexual activity: Yes  ?  Comment: partner had vasectomy  ?Other Topics Concern  ? Not on file  ?Social History Narrative  ? Not on file  ? ?Social Determinants of Health  ? ?Financial Resource Strain: Not on file  ?Food Insecurity: Not on file  ?Transportation Needs: Not on file  ?Physical Activity: Not on file  ?Stress: Not on file  ?Social Connections: Not on file  ?Intimate Partner Violence: Not on file  ? ? ?Allergies:  ?No Known Allergies ? ?Medications: ?Prior to Admission medications   ?Medication Sig Start Date End Date Taking? Authorizing Provider  ?levonorgestrel-ethinyl estradiol (SEASONALE) 0.15-0.03 MG tablet Take 1 tablet by mouth daily. 03/28/22  Yes Rod Can, CNM  ?Multiple Vitamin (MULTIVITAMIN WITH MINERALS) TABS tablet Take 1 tablet by mouth daily.   Yes [provider]  ?valACYclovir (VALTREX) 1000 MG tablet Take 1 tablet (1,000 mg total) by mouth daily. 03/14/22 10/10/22 Yes Imagene Riches, CNM  ?benzonatate (TESSALON PERLES) 100 MG capsule Take 1 capsule (100 mg total) by mouth 3 (three) times daily as needed for cough. ?Patient not taking: Reported on 03/28/2022 04/21/21 04/21/22  Marlana Salvage, PA  ?clonazePAM (KLONOPIN) 1 MG tablet Take 1 tablet (1 mg total) by mouth 2 (two) times daily as needed for anxiety. 03/28/22   Rod Can, CNM  ?penicillin v potassium (VEETID) 500 MG tablet SMARTSIG:1 Tablet(s) By Mouth Every 12 Hours 03/24/22   [provider]  ? ? ?Physical Exam ?Vitals: Blood pressure 122/70, height 5'  7" (1.702 m), weight 148 lb (67.1 kg). ? ?General: NAD ?HEENT: normocephalic, anicteric ?Thyroid: no enlargement, no palpable nodules ?Pulmonary: No increased work of breathing, CTAB ?Cardiovascular: RRR, distal pulses 2+ ?Breast: Breast symmetrical, no tenderness, no palpable nodules or masses, no skin or nipple retraction present, no nipple discharge.  No axillary or supraclavicular lymphadenopathy. ?Abdomen: NABS, soft, non-tender, non-distended.  Umbilicus without lesions.  No hepatomegaly, splenomegaly or masses palpable. No evidence of hernia  ?Genitourinary: ? External: Normal external female genitalia.  Normal urethral meatus, normal Bartholin's and Skene's glands.   ? Vagina: Normal vaginal mucosa, no evidence of prolapse, scant thin white discharge.   ?Extremities: no edema, erythema, or tenderness ?Neurologic: Grossly intact ?Psychiatric: mood appropriate, affect full ? ? ?Assessment: 35 y.o. G3P2003 routine annual exam ? ?Plan: ?Problem List Items Addressed This Visit   ? ?  ? Other  ? Anxiety  ? Relevant Medications  ? clonazePAM (KLONOPIN) 1 MG tablet  ? ?Other Visit Diagnoses   ? ? Well woman exam with routine gynecological  exam    -  Primary  ? Relevant Medications  ? levonorgestrel-ethinyl estradiol (SEASONALE) 0.15-0.03 MG tablet  ? Encounter for initial prescription of contraceptive pills      ? Relevant Medications  ? levonorgestrel-ethinyl estradiol (SEASONALE) 0.15-0.03 MG tablet  ? Vaginal discharge      ? Relevant Orders  ? Cervicovaginal ancillary only  ? Yeast vaginitis      ? ?  ? ? ?1) STI screening  was offered and accepted ? ?2)  ASCCP guidelines and rationale discussed.  Patient opts for every 3 years screening interval ? ?3) Contraception - the patient is currently using  OCP (estrogen/progesterone) Sprintec.  She is interested in changing to [place].  ? ?4) Routine healthcare maintenance including cholesterol, diabetes screening discussed Declines ? ?5) Return in about 1 year  (around 03/29/2023) for annual established gyn. ? ?6) Follow up as needed after lab results ? ? ?Rod Can, CNM ?[facility] ?[facility] ?03/29/2022, 10:24 AM ? ? ?  ?

## 2022-03-30 ENCOUNTER — Encounter: Payer: Self-pay | Admitting: Advanced Practice Midwife

## 2022-04-18 ENCOUNTER — Encounter: Payer: Self-pay | Admitting: Obstetrics and Gynecology

## 2022-05-18 ENCOUNTER — Other Ambulatory Visit: Payer: Self-pay | Admitting: Advanced Practice Midwife

## 2022-05-18 DIAGNOSIS — F419 Anxiety disorder, unspecified: Secondary | ICD-10-CM

## 2022-05-19 ENCOUNTER — Other Ambulatory Visit: Payer: Self-pay | Admitting: Obstetrics and Gynecology

## 2022-05-19 ENCOUNTER — Telehealth: Payer: Self-pay

## 2022-05-19 DIAGNOSIS — F419 Anxiety disorder, unspecified: Secondary | ICD-10-CM

## 2022-05-19 MED ORDER — CLONAZEPAM 1 MG PO TABS: 1.0000 mg | ORAL_TABLET | Freq: Two times a day (BID) | ORAL | 0 refills | Status: DC | PRN

## 2022-05-19 NOTE — Progress Notes (Signed)
Rx RF klonopin for anxiety. Pt getting Rx from [place] and Dr. Darrold Junker, filling about every 6 wks. Will send info to [place] [facility] since ordering provider from here.

## 2022-05-19 NOTE — Telephone Encounter (Signed)
Rx RF eRxd, #30, RF 0. Pt getting Rx from [place] and Dr. Saralyn Pilar about every 6 wks. Sent to Opal Sidles to [place] need better anxiety Rx regiment.

## 2022-05-19 NOTE — Telephone Encounter (Signed)
Pt calling and needing a refill on her Klonopin. She is up to date on annual. She does not use this everyday. [place] is leaving for [place] on Saturday morning and really needing it before then. JEG not in office.. can you please refill this for pt?

## 2022-07-02 ENCOUNTER — Emergency Department
Admission: EM | Admit: 2022-07-02 | Discharge: 2022-07-02 | Disposition: A | Discharge status: Home / Self Care | Payer: Medicaid Other

## 2022-07-04 ENCOUNTER — Other Ambulatory Visit: Payer: Self-pay | Admitting: Obstetrics and Gynecology

## 2022-07-04 DIAGNOSIS — F419 Anxiety disorder, unspecified: Secondary | ICD-10-CM

## 2022-07-05 NOTE — Telephone Encounter (Signed)
Pt calling; pharm sent request for medication and hasn't had a response; pt wants to know what the hold up is.  [phone]

## 2022-07-06 NOTE — Telephone Encounter (Signed)
MyChart msg sent to pt.

## 2022-08-01 ENCOUNTER — Encounter (HOSPITAL_COMMUNITY): Payer: Self-pay | Admitting: *Deleted

## 2022-08-01 ENCOUNTER — Emergency Department (HOSPITAL_COMMUNITY)
Admission: EM | Admit: 2022-08-01 | Discharge: 2022-08-02 | Disposition: A | Discharge status: Home / Self Care | Payer: Medicaid Other | Attending: Emergency Medicine | Admitting: Emergency Medicine

## 2022-08-01 DIAGNOSIS — Y907 Blood alcohol level of 200-239 mg/100 ml: Secondary | ICD-10-CM | POA: Diagnosis not present

## 2022-08-01 DIAGNOSIS — Z8541 Personal history of malignant neoplasm of cervix uteri: Secondary | ICD-10-CM | POA: Diagnosis not present

## 2022-08-01 DIAGNOSIS — R519 Headache, unspecified: Secondary | ICD-10-CM | POA: Insufficient documentation

## 2022-08-01 DIAGNOSIS — R258 Other abnormal involuntary movements: Secondary | ICD-10-CM | POA: Diagnosis not present

## 2022-08-01 DIAGNOSIS — R569 Unspecified convulsions: Secondary | ICD-10-CM

## 2022-08-01 DIAGNOSIS — F10129 Alcohol abuse with intoxication, unspecified: Secondary | ICD-10-CM | POA: Diagnosis not present

## 2022-08-01 NOTE — ED Triage Notes (Signed)
Pt arrived with EMS for multiple seizures witnessed by family. Pt A&Ox4 on arrival, prescribed klonopin for anxiety. C/o head and neck pain after hitting her head on pavement. 18g L AC, EMS gave 781m NS

## 2022-08-02 ENCOUNTER — Emergency Department (HOSPITAL_COMMUNITY): Payer: Medicaid Other

## 2022-08-02 ENCOUNTER — Encounter (HOSPITAL_COMMUNITY): Payer: Self-pay | Admitting: *Deleted

## 2022-08-02 LAB — CBC WITH DIFFERENTIAL/PLATELET
Abs Immature Granulocytes: 0.01 10*3/uL (ref 0.00–0.07)
Basophils Absolute: 0 10*3/uL (ref 0.0–0.1)
Basophils Relative: 1 %
Eosinophils Absolute: 0 10*3/uL (ref 0.0–0.5)
Eosinophils Relative: 1 %
HCT: 33.5 % — ABNORMAL LOW (ref 36.0–46.0)
Hemoglobin: 11.4 g/dL — ABNORMAL LOW (ref 12.0–15.0)
Immature Granulocytes: 0 %
Lymphocytes Relative: 37 %
Lymphs Abs: 1.5 10*3/uL (ref 0.7–4.0)
MCH: 32.5 pg (ref 26.0–34.0)
MCHC: 34 g/dL (ref 30.0–36.0)
MCV: 95.4 fL (ref 80.0–100.0)
Monocytes Absolute: 0.3 10*3/uL (ref 0.1–1.0)
Monocytes Relative: 6 %
Neutro Abs: 2.3 10*3/uL (ref 1.7–7.7)
Neutrophils Relative %: 55 %
Platelets: 261 10*3/uL (ref 150–400)
RBC: 3.51 MIL/uL — ABNORMAL LOW (ref 3.87–5.11)
RDW: 13.3 % (ref 11.5–15.5)
WBC: 4.1 10*3/uL (ref 4.0–10.5)
nRBC: 0 % (ref 0.0–0.2)

## 2022-08-02 LAB — BASIC METABOLIC PANEL
Anion gap: 8 (ref 5–15)
BUN: 8 mg/dL (ref 6–20)
CO2: 22 mmol/L (ref 22–32)
Calcium: 7.8 mg/dL — ABNORMAL LOW (ref 8.9–10.3)
Chloride: 114 mmol/L — ABNORMAL HIGH (ref 98–111)
Creatinine, Ser: 0.55 mg/dL (ref 0.44–1.00)
GFR, Estimated: >60 mL/min (ref >60)
Glucose, Bld: 99 mg/dL (ref 70–99)
Potassium: 3.4 mmol/L — ABNORMAL LOW (ref 3.5–5.1)
Sodium: 144 mmol/L (ref 135–145)

## 2022-08-02 LAB — URINALYSIS, ROUTINE W REFLEX MICROSCOPIC
Bilirubin Urine: NEGATIVE
Glucose, UA: NEGATIVE mg/dL
Hgb urine dipstick: NEGATIVE
Ketones, ur: NEGATIVE mg/dL
Leukocytes,Ua: NEGATIVE
Nitrite: NEGATIVE
Protein, ur: NEGATIVE mg/dL
Specific Gravity, Urine: 1.005 (ref 1.005–1.030)
pH: 7 (ref 5.0–8.0)

## 2022-08-02 LAB — ETHANOL: Alcohol, Ethyl (B): 215 mg/dL — ABNORMAL HIGH (ref <10)

## 2022-08-02 LAB — I-STAT BETA HCG BLOOD, ED (MC, WL, AP ONLY): I-stat hCG, quantitative: <5 m[IU]/mL (ref <5)

## 2022-08-02 NOTE — ED Notes (Signed)
DC instructions reviewed with pt. PT verbalized understanding. PT DC °

## 2022-08-02 NOTE — ED Provider Notes (Signed)
[facility] Provider Note   CSN: [id number] Arrival date & time: 08/01/22  2351     History  Chief Complaint  Patient presents with   Seizures    Nashay T Diep is a 36 y.o. female.  HPI     This is a 36 year old female who presents with seizure-like event.  Patient reports that she was drinking most of the afternoon at a party.  She has a history of losing consciousness and seizure-like activity that she relates to her known anxiety.  She never had an EEG to confirm seizures and she is not on any antiepileptics.  She does take Klonopin but reports compliance with this and has not stopped recently.  She states that she felt her anxiety cannot come on and fell to the side.  Her husband at bedside describes her falling to the left and having multiple episodes of shaking.  He reports 5-7 episodes over 15-minute period of time.  She was reportedly unconscious at this time.  After she came to, she was awake and alert.  She did not have an episode of confusion.  She states that she has previously worn a heart monitor for these episodes.  She does report hitting her head after falling.  She is complaining of right temporal pain.  Otherwise denies chest pain, shortness of breath, abdominal pain, recent illness.  Home Medications Prior to Admission medications   Medication Sig Start Date End Date Taking? Authorizing Provider  clonazePAM (KLONOPIN) 1 MG tablet Take 1 tablet by mouth twice daily as needed for anxiety 01/01/52   Copland, Deirdre Evener, PA-C  levonorgestrel-ethinyl estradiol (SEASONALE) 0.15-0.03 MG tablet Take 1 tablet by mouth daily. 03/28/22   Rod Can, CNM  Multiple Vitamin (MULTIVITAMIN WITH MINERALS) TABS tablet Take 1 tablet by mouth daily.    [provider]  penicillin v potassium (VEETID) 500 MG tablet SMARTSIG:1 Tablet(s) By Mouth Every 12 Hours 03/24/22   [provider]  valACYclovir (VALTREX) 1000 MG tablet Take 1  tablet (1,000 mg total) by mouth daily. 03/14/22 10/10/22  Imagene Riches, CNM      Allergies    Patient has no known allergies.    Review of Systems   Review of Systems  Respiratory:  Negative for shortness of breath.   Cardiovascular:  Negative for chest pain.  Genitourinary:  Negative for dysuria.  Neurological:  Positive for tremors.       Loss of consciousness  All other systems reviewed and are negative.   Physical Exam Updated Vital Signs BP 101/74 (BP Location: Right Arm)   Pulse 91   Temp 98 F (36.7 C) (Oral)   Resp (!) 24   LMP 07/11/2022   SpO2 98%  Physical Exam Vitals and nursing note reviewed.  Constitutional:      Appearance: She is well-developed. She is not ill-appearing.  HENT:     Head: Normocephalic.  Eyes:     Pupils: Pupils are equal, round, and reactive to light.     Comments: Pupils 4 mm reactive bilaterally  Neck:     Comments: C-collar in place Cardiovascular:     Rate and Rhythm: Normal rate and regular rhythm.     Heart sounds: Normal heart sounds.  Pulmonary:     Effort: Pulmonary effort is normal. No respiratory distress.     Breath sounds: No wheezing.  Abdominal:     Palpations: Abdomen is soft.  Skin:    General: Skin is warm and  dry.  Neurological:     Mental Status: She is alert and oriented to person, place, and time.     Comments: Cranial nerves II through XII intact, 5 out of 5 strength in all 4 extremities, no dysmetria to finger-nose-finger  Psychiatric:        Mood and Affect: Mood normal.     ED Results / Procedures / Treatments   Labs (all labs ordered are listed, but only abnormal results are displayed) Labs Reviewed  CBC WITH DIFFERENTIAL/PLATELET - Abnormal; Notable for the following components:      Result Value   RBC 3.51 (*)    Hemoglobin 11.4 (*)    HCT 33.5 (*)    All other components within normal limits  BASIC METABOLIC PANEL - Abnormal; Notable for the following components:   Potassium 3.4 (*)     Chloride 114 (*)    Calcium 7.8 (*)    All other components within normal limits  ETHANOL - Abnormal; Notable for the following components:   Alcohol, Ethyl (B) 215 (*)    All other components within normal limits  URINALYSIS, ROUTINE W REFLEX MICROSCOPIC - Abnormal; Notable for the following components:   Color, Urine COLORLESS (*)    All other components within normal limits  I-STAT BETA HCG BLOOD, ED ([facility], [facility], [facility] ONLY)    EKG None  Radiology CT Head Wo Contrast  Result Date: 08/02/2022 CLINICAL DATA:  Head trauma. EXAM: CT HEAD WITHOUT CONTRAST TECHNIQUE: Contiguous axial images were obtained from the base of the skull through the vertex without intravenous contrast. RADIATION DOSE REDUCTION: This exam was performed according to the departmental dose-optimization program which includes automated exposure control, adjustment of the mA and/or kV according to patient size and/or use of iterative reconstruction technique. COMPARISON:  Head CT 12/29/2020 FINDINGS: Brain: No evidence of acute infarction, hemorrhage, hydrocephalus, extra-axial collection or mass lesion/mass effect. Vascular: No hyperdense vessel or unexpected calcification. Skull: There is no visible scalp hematoma. The calvarium, skull base and orbits are intact. No focal skull lesion is seen. Sinuses/Orbits: No acute finding. Other: None. IMPRESSION: No acute intracranial CT findings or depressed skull fractures. Comparison to the prior study reveals no significant interval change. Electronically Signed   By: Telford Nab M.D.   On: 08/02/2022 00:54    Procedures Procedures    Medications Ordered in ED Medications - No data to display  ED Course/ Medical Decision Making/ A&P                           Medical Decision Making Amount and/or Complexity of Data Reviewed Labs: ordered. Radiology: ordered.   This patient presents to the ED for concern of her like activity, loss of consciousness, this involves an  extensive number of treatment options, and is a complaint that carries with it a high risk of complications and morbidity.  I considered the following differential and admission for this acute, potentially life threatening condition.  The differential diagnosis includes seizure, pseudoseizure, syncope, intoxication  MDM:    This is a 36 year old female who presents with loss of consciousness and seizure-like activity.  She is nontoxic and vital signs are reassuring.  I have low suspicion for true seizure.  It does not appear patient had a true postictal state.  Especially given the history that she had multiple shaking episodes in a row, would expect her to have a more prolonged confusion with recurrent seizure.  Could be syncope.  EKG without acute ischemic arrhythmic changes.  She reportedly has previously worn a Holter monitor for these episodes.  She was acutely intoxicated.  Alcohol level greater than 200.  This could be contributory.  Other lab work-up is largely reassuring.  Patient advised not to drive.  Will provide with neurology follow-up.  CT head without acute traumatic injury.  (Labs, imaging, consults)  Labs: I Ordered, and personally interpreted labs.  The pertinent results include: CBC, BMP, EtOH, beta-hCG  Imaging Studies ordered: I ordered imaging studies including CT head I independently visualized and interpreted imaging. I agree with the radiologist interpretation  Additional history obtained from husband at bedside.  External records from outside source obtained and reviewed including audiology notes  Cardiac Monitoring: The patient was maintained on a cardiac monitor.  I personally viewed and interpreted the cardiac monitored which showed an underlying rhythm of: Normal sinus rhythm  Reevaluation: After the interventions noted above, I reevaluated the patient and found that they have :improved  Social Determinants of Health: Lives independently  Disposition:  Discharge  Co morbidities that complicate the patient evaluation  Past Medical History:  Diagnosis Date   Anxiety    BRCA negative 02/2021   MyRisk neg   Cancer ([place])    cervical cells   Depression    Family history of breast cancer    Increased risk of breast cancer 02/2021   IBIS=23%/riskscore=33%   Seizures ([place])    last seizure 5 years ago     Medicines No orders of the defined types were placed in this encounter.   I have reviewed the patients home medicines and have made adjustments as needed  Problem List / ED Course: Problem List Items Addressed This Visit   None Visit Diagnoses     Seizure-like activity ([place])    -  Primary   Alcohol abuse with intoxication ([place])                       Final Clinical Impression(s) / ED Diagnoses Final diagnoses:  Seizure-like activity ([place])  Alcohol abuse with intoxication ([place])    Rx / DC Orders ED Discharge Orders          Ordered    Ambulatory referral to Neurology       Comments: An appointment is requested in approximately: 2 weeks   08/02/22 0236              Merryl Hacker, MD 08/02/22 [phone]

## 2022-08-02 NOTE — Discharge Instructions (Addendum)
You were seen today for an episode of loss of consciousness.  This is likely multifactorial.  Your labs are reassuring.  This is not consistent with a true seizure.  May be related to syncope or intoxication.  Follow-up closely with your primary physician.  You will be given neurology referral.  You should not drive or operate heavy machinery until cleared by neurology.

## 2022-08-17 ENCOUNTER — Ambulatory Visit: Payer: Medicaid Other | Admitting: Neurology

## 2022-08-17 ENCOUNTER — Encounter: Payer: Self-pay | Admitting: Neurology

## 2022-08-17 VITALS — BP 120/71 | HR 80 | Ht 67.0 in | Wt 149.0 lb

## 2022-08-17 DIAGNOSIS — G43909 Migraine, unspecified, not intractable, without status migrainosus: Secondary | ICD-10-CM | POA: Diagnosis not present

## 2022-08-17 DIAGNOSIS — R569 Unspecified convulsions: Secondary | ICD-10-CM | POA: Diagnosis not present

## 2022-08-17 DIAGNOSIS — F4321 Adjustment disorder with depressed mood: Secondary | ICD-10-CM

## 2022-08-17 MED ORDER — SUMATRIPTAN SUCCINATE 50 MG PO TABS: 50.0000 mg | ORAL_TABLET | ORAL | 1 refills | Status: DC | PRN

## 2022-08-17 NOTE — Progress Notes (Signed)
[facility]  PATIENT: Latoya Luna DOB: Jun 26, 1986  REQUESTING CLINICIAN: Horton, Barbette Hair, MD HISTORY FROM: Patient  REASON FOR VISIT: Seizure like activity    HISTORICAL  CHIEF COMPLAINT:  Chief Complaint  Patient presents with   New Patient (Initial Visit)    Rm 13. Alone. NP internal referral for seizure-like activity.    HISTORY OF PRESENT ILLNESS:  Latoya Luna is a 36 year old woman past medical history episodic migraine and anxiety who is presenting after seizure-like event.  Patient reports on September 5, she was at a friend house having dinner and the next and that she remembers is waking up in the hospital in a c-collar.  She was told that she passed out on the floor and has convulsion.  She had a total of 3 episodes of convulsion. In between friend reported that she was conscious and passed out again.  She did hit her head and complained of headaches afterwards.  There was no complaint of urinary incontinence or tongue biting.  Patient does not remember any of the episodes. She reported in the past having history of anxiety, when she does have increased stress she will feel her heart racing when she knows that she is about to pass out. Sometimes she will lay on the floor but other times she will just pass out and wake up without any confusion.  The difference between her typical syncope and this episode is that she did not get any warning signs.  In the ED she was back into her normal self, her initial work-up including a head CT was within normal limit and she was discharged home.  She denies any additional events since discharge from the hospital.  Patient also reports being under a lot of stress lately, that a few weeks prior to the event she found her deceased fianc in his house. He committed suicide.  She did have grief counseling at church but feels like it was not helpful.  Patient also reports episodic migraines, she had tried Imitrex in the  past with benefit.  On average she will have 1 headache every 2 to 3 weeks with photophobia and phonophobia.  Sometimes headaches are associated with nausea.  Headaches can last up to 3 days if she is not taking any medication.  She has never been into any preventive medication.   Handedness: Left handed   Onset: Since teenager  Seizure Type: Loss of consciousness   Current frequency: 9/5, prior episode was a month before that   Any injuries from seizures: Head injury  Seizure risk factors:  Hx concussion  Previous ASMs: Keppra, Depakote, lamictal   Currenty ASMs: None  ASMs side effects: Not applicable  Brain Images: Normal head CT   Previous EEGs: None    OTHER MEDICAL CONDITIONS: Anxiety   REVIEW OF SYSTEMS: Full 14 system review of systems performed and negative with exception of: As noted in the HPI   ALLERGIES: No Known Allergies  HOME MEDICATIONS: Outpatient Medications Prior to Visit  Medication Sig Dispense Refill   clonazePAM (KLONOPIN) 1 MG tablet Take 1 tablet by mouth twice daily as needed for anxiety 30 tablet 0   Multiple Vitamin (MULTIVITAMIN WITH MINERALS) TABS tablet Take 1 tablet by mouth daily.     valACYclovir (VALTREX) 1000 MG tablet Take 1 tablet (1,000 mg total) by mouth daily. 30 tablet 0   levonorgestrel-ethinyl estradiol (SEASONALE) 0.15-0.03 MG tablet Take 1 tablet by mouth daily. 91 tablet 4   penicillin v potassium (VEETID)  500 MG tablet SMARTSIG:1 Tablet(s) By Mouth Every 12 Hours     No facility-administered medications prior to visit.    PAST MEDICAL HISTORY: Past Medical History:  Diagnosis Date   Anxiety    BRCA negative 02/2021   MyRisk neg   Cancer ([place])    cervical cells   Depression    Family history of breast cancer    Increased risk of breast cancer 02/2021   IBIS=23%/riskscore=33%   Seizures ([facility])    last seizure 5 years ago    PAST SURGICAL HISTORY: Past Surgical History:  Procedure Laterality Date   BREAST  SURGERY      FAMILY HISTORY: Family History  Problem Relation Age of Onset   Prostate cancer Father 24   Breast cancer Paternal Grandmother 29   Breast cancer Paternal Aunt 7   Pancreatic cancer Maternal Aunt 45    SOCIAL HISTORY: Social History   Socioeconomic History   Marital status: Single    Spouse name: Not on file   Number of children: Not on file   Years of education: Not on file   Highest education level: Not on file  Occupational History   Not on file  Tobacco Use   Smoking status: Never   Smokeless tobacco: Never  Substance and Sexual Activity   Alcohol use: Yes    Comment: occ   Drug use: No   Sexual activity: Yes    Comment: partner had vasectomy  Other Topics Concern   Not on file  Social History Narrative   Not on file   Social Determinants of Health   Financial Resource Strain: Not on file  Food Insecurity: Not on file  Transportation Needs: Not on file  Physical Activity: Not on file  Stress: Not on file  Social Connections: Not on file  Intimate Partner Violence: Not on file    PHYSICAL EXAM  GENERAL EXAM/CONSTITUTIONAL: Vitals:  Vitals:   08/17/22 0843  BP: 120/71  Pulse: 80  Weight: 149 lb (67.6 kg)  Height: _0  (1.702 m)   Body mass index is 23.34 kg/m. Wt Readings from Last 3 Encounters:  08/17/22 149 lb (67.6 kg)  03/28/22 148 lb (67.1 kg)  04/21/21 145 lb (65.8 kg)   Patient is in no distress; well developed, nourished and groomed; neck is supple  EYES: Pupils round and reactive to light, Visual fields full to confrontation, Extraocular movements intacts,  No results found.  MUSCULOSKELETAL: Gait, strength, tone, movements noted in Neurologic exam below  NEUROLOGIC: MENTAL STATUS:      No data to display         awake, alert, oriented to person, place and time recent and remote memory intact normal attention and concentration language fluent, comprehension intact, naming intact fund of knowledge  appropriate  CRANIAL NERVE:  2nd, 3rd, 4th, 6th - pupils equal and reactive to light, visual fields full to confrontation, extraocular muscles intact, no nystagmus 5th - facial sensation symmetric 7th - facial strength symmetric 8th - hearing intact 9th - palate elevates symmetrically, uvula midline 11th - shoulder shrug symmetric 12th - tongue protrusion midline  MOTOR:  normal bulk and tone, full strength in the BUE, BLE  SENSORY:  normal and symmetric to light touch  COORDINATION:  finger-nose-finger, fine finger movements normal  REFLEXES:  deep tendon reflexes present and symmetric  GAIT/STATION:  normal   DIAGNOSTIC DATA (LABS, IMAGING, TESTING) - I reviewed patient records, labs, notes, testing and imaging myself where available.  Lab Results  Component Value Date   WBC 4.1 08/02/2022   HGB 11.4 (L) 08/02/2022   HCT 33.5 (L) 08/02/2022   MCV 95.4 08/02/2022   PLT 261 08/02/2022      Component Value Date/Time   NA 144 08/02/2022 0103   NA 139 01/01/2015 1104   K 3.4 (L) 08/02/2022 0103   K 3.6 01/01/2015 1104   CL 114 (H) 08/02/2022 0103   CL 107 01/01/2015 1104   CO2 22 08/02/2022 0103   CO2 25 01/01/2015 1104   GLUCOSE 99 08/02/2022 0103   GLUCOSE 68 01/01/2015 1104   BUN 8 08/02/2022 0103   BUN 8 01/01/2015 1104   CREATININE 0.55 08/02/2022 0103   CREATININE 0.43 (L) 01/01/2015 1104   CALCIUM 7.8 (L) 08/02/2022 0103   CALCIUM 7.8 (L) 01/01/2015 1104   PROT 6.9 12/29/2020 1805   PROT 6.4 01/01/2015 1104   ALBUMIN 3.7 12/29/2020 1805   ALBUMIN 2.5 (L) 01/01/2015 1104   AST 27 12/29/2020 1805   AST 23 01/01/2015 1104   ALT 26 12/29/2020 1805   ALT 20 01/01/2015 1104   ALKPHOS 27 (L) 12/29/2020 1805   ALKPHOS 53 01/01/2015 1104   BILITOT 0.5 12/29/2020 1805   BILITOT 0.2 01/01/2015 1104   GFRNONAA >60 08/02/2022 0103   GFRNONAA >60 01/01/2015 1104   GFRNONAA >60 02/27/2014 1606   GFRAA >60 05/27/2020 1227   GFRAA >60 01/01/2015 1104    GFRAA >60 02/27/2014 1606   No results found for: "CHOL", "HDL", "LDLCALC", "LDLDIRECT", "TRIG" No results found for: "HGBA1C" No results found for: "VITAMINB12" No results found for: "TSH"  Head CT 08/02/22 No acute intracranial CT findings or depressed skull fractures.  I personally reviewed brain Images.   ASSESSMENT AND PLAN  36 y.o. year old female  with anxiety/depression, episodic migraine, who is presenting after seizure-like event.  Patient passed out while having dinner, without any warning signs.  She reported with  her typical panic anxiety attack, she will have a warning sign prior to passing out.  She reports being under a lot of stress in the weeks prior to the event, she found her deceased fianc in his house, he committed suicide.  She has tried to deal with it by getting grief counseling at church but feels like it was not helpful.  At this time we will proceed with a routine EEG and if normal then patient episode likely nonepileptic seizure and related to increased stress anxiety.  I did refer her to psychology for management of her grief and depression. In terms of the episodic migraines, I will start her on Imitrex.  I also advised her to contact me if her headaches do not improve.  She voices understanding.  Continue to follow with PCP and return as needed.   1. Grief   2. Episodic migraine   3. Seizure-like activity ([place])     Patient Instructions  Routine EEG, I will contact you to go over the result Continue current medication Continue follow-up PCP Referral to psychology for grief due to loss of fianc and increased anxiety depression   Per [facility] statutes, patients with seizures are not allowed to drive until they have been seizure-free for six months.  Other recommendations include using caution when using heavy equipment or power tools. Avoid working on ladders or at heights. Take showers instead of baths.  Do not swim alone.  Ensure the water  temperature is not too high on the home water heater. Do not go  swimming alone. Do not lock yourself in a room alone (i.e. bathroom). When caring for infants or small children, sit down when holding, feeding, or changing them to minimize risk of injury to the child in the event you have a seizure. Maintain good sleep hygiene. Avoid alcohol.  Also recommend adequate sleep, hydration, good diet and minimize stress.   During the Seizure  - First, ensure adequate ventilation and place patients on the floor on their left side  Loosen clothing around the neck and ensure the airway is patent. If the patient is clenching the teeth, do not force the mouth open with any object as this can cause severe damage - Remove all items from the surrounding that can be hazardous. The patient may be oblivious to what's happening and may not even know what he or she is doing. If the patient is confused and wandering, either gently guide him/her away and block access to outside areas - Reassure the individual and be comforting - Call 911. In most cases, the seizure ends before EMS arrives. However, there are cases when seizures may last over 3 to 5 minutes. Or the individual may have developed breathing difficulties or severe injuries. If a pregnant patient or a person with diabetes develops a seizure, it is prudent to call an ambulance. - Finally, if the patient does not regain full consciousness, then call EMS. Most patients will remain confused for about 45 to 90 minutes after a seizure, so you must use judgment in calling for help. - Avoid restraints but make sure the patient is in a bed with padded side rails - Place the individual in a lateral position with the neck slightly flexed; this will help the saliva drain from the mouth and prevent the tongue from falling backward - Remove all nearby furniture and other hazards from the area - Provide verbal assurance as the individual is regaining consciousness - Provide  the patient with privacy if possible - Call for help and start treatment as ordered by the caregiver   After the Seizure (Postictal Stage)  After a seizure, most patients experience confusion, fatigue, muscle pain and/or a headache. Thus, one should permit the individual to sleep. For the next few days, reassurance is essential. Being calm and helping reorient the person is also of importance.  Most seizures are painless and end spontaneously. Seizures are not harmful to others but can lead to complications such as stress on the lungs, brain and the heart. Individuals with prior lung problems may develop labored breathing and respiratory distress.     Orders Placed This Encounter  Procedures   Ambulatory referral to Psychology   EEG adult    Meds ordered this encounter  Medications   SUMAtriptan (IMITREX) 50 MG tablet    Sig: Take 1 tablet (50 mg total) by mouth every 2 (two) hours as needed for migraine. May repeat in 2 hours if headache persists or recurs.    Dispense:  10 tablet    Refill:  1    Return if symptoms worsen or fail to improve.    Alric Ran, MD 08/17/2022, 9:42 AM  [facility] [address] [place], [place] [address] [phone]

## 2022-08-17 NOTE — Patient Instructions (Signed)
Routine EEG, I will contact you to go over the result Continue current medication Continue follow-up PCP Referral to psychology for grief due to loss of fianc and increased anxiety depression

## 2022-08-22 ENCOUNTER — Ambulatory Visit: Payer: Medicaid Other | Admitting: Neurology

## 2022-08-22 ENCOUNTER — Other Ambulatory Visit: Payer: Self-pay | Admitting: Obstetrics and Gynecology

## 2022-08-22 DIAGNOSIS — R569 Unspecified convulsions: Secondary | ICD-10-CM | POA: Diagnosis not present

## 2022-08-22 DIAGNOSIS — F419 Anxiety disorder, unspecified: Secondary | ICD-10-CM

## 2022-08-29 NOTE — Procedures (Signed)
    History:  36 year old woman with seizure vs. Syncope   EEG classification: Awake and drowsy  Description of the recording: The background rhythms of this recording consists of a fairly well modulated medium amplitude alpha rhythm of 10.5 Hz that is reactive to eye opening and closure. As the record progresses, the patient appears to remain in the waking state throughout the recording. Photic stimulation was performed, did not show any abnormalities. Hyperventilation was also performed, did not show any abnormalities. Toward the end of the recording, the patient enters the drowsy state with slight symmetric slowing seen. The patient never enters stage II sleep. No abnormal epileptiform discharges seen during this recording. There was no focal slowing. EKG monitor shows no evidence of cardiac rhythm abnormalities with a heart rate of 72.  Abnormality: None   Impression: This is a normal EEG recording in the waking and drowsy state. No evidence of interictal epileptiform discharges seen. A normal EEG does not exclude a diagnosis of epilepsy.    Alric Ran, MD [facility]

## 2022-09-12 ENCOUNTER — Ambulatory Visit (INDEPENDENT_AMBULATORY_CARE_PROVIDER_SITE_OTHER): Payer: Medicaid Other

## 2022-09-12 ENCOUNTER — Other Ambulatory Visit (HOSPITAL_COMMUNITY)
Admission: RE | Admit: 2022-09-12 | Discharge: 2022-09-12 | Disposition: A | Discharge status: Home / Self Care | Payer: Medicaid Other | Source: Ambulatory Visit | Attending: Advanced Practice Midwife | Admitting: Advanced Practice Midwife

## 2022-09-12 VITALS — BP 101/70 | HR 88 | Resp 16 | Ht 67.0 in | Wt 151.6 lb

## 2022-09-12 DIAGNOSIS — Z23 Encounter for immunization: Secondary | ICD-10-CM

## 2022-09-12 DIAGNOSIS — N760 Acute vaginitis: Secondary | ICD-10-CM | POA: Insufficient documentation

## 2022-09-12 NOTE — Progress Notes (Unsigned)
    Nurse Visit Note  Subjective:    Patient ID: Latoya Luna, female    DOB: 10-Jul-1986, 36 y.o.   MRN: [id number]  HPI  Patient is a 36 y.o. [id number] female who presents for vaginitis. She has some discharge, irritation that comes and goes. She denies having odor, itching. She did a self swab to check for yeast and BV. She also wanted to have her urine check to rule out UTI. She denies having any symptoms. Urine dip was negative.

## 2022-09-14 LAB — CERVICOVAGINAL ANCILLARY ONLY
Bacterial Vaginitis (gardnerella): NEGATIVE
Candida Glabrata: NEGATIVE
Candida Vaginitis: POSITIVE — AB
Comment: NEGATIVE
Comment: NEGATIVE
Comment: NEGATIVE

## 2022-09-16 ENCOUNTER — Other Ambulatory Visit: Payer: Self-pay

## 2022-09-16 DIAGNOSIS — B3731 Acute candidiasis of vulva and vagina: Secondary | ICD-10-CM

## 2022-09-16 MED ORDER — FLUCONAZOLE 150 MG PO TABS: 150.0000 mg | ORAL_TABLET | Freq: Once | ORAL | 0 refills | Status: AC

## 2022-10-13 ENCOUNTER — Other Ambulatory Visit: Payer: Self-pay | Admitting: Obstetrics and Gynecology

## 2022-10-13 DIAGNOSIS — F419 Anxiety disorder, unspecified: Secondary | ICD-10-CM

## 2022-10-17 ENCOUNTER — Other Ambulatory Visit: Payer: Self-pay | Admitting: Obstetrics and Gynecology

## 2022-10-17 DIAGNOSIS — F419 Anxiety disorder, unspecified: Secondary | ICD-10-CM

## 2022-10-19 ENCOUNTER — Encounter: Payer: Self-pay | Admitting: Advanced Practice Midwife

## 2022-10-19 ENCOUNTER — Other Ambulatory Visit: Payer: Self-pay | Admitting: Obstetrics and Gynecology

## 2022-10-19 DIAGNOSIS — F419 Anxiety disorder, unspecified: Secondary | ICD-10-CM

## 2022-10-19 MED ORDER — CLONAZEPAM 1 MG PO TABS: 1.0000 mg | ORAL_TABLET | Freq: Two times a day (BID) | ORAL | 0 refills | Status: DC | PRN

## 2022-10-19 NOTE — Progress Notes (Signed)
Rx RF clonazepam to take sparingly

## 2022-11-03 ENCOUNTER — Encounter: Payer: Self-pay | Admitting: Advanced Practice Midwife

## 2022-11-03 ENCOUNTER — Other Ambulatory Visit: Payer: Self-pay | Admitting: Obstetrics

## 2022-11-03 ENCOUNTER — Other Ambulatory Visit: Payer: Self-pay | Admitting: Neurology

## 2022-11-03 ENCOUNTER — Encounter: Payer: Self-pay | Admitting: Neurology

## 2022-11-03 DIAGNOSIS — B009 Herpesviral infection, unspecified: Secondary | ICD-10-CM

## 2022-11-03 MED ORDER — SUMATRIPTAN SUCCINATE 50 MG PO TABS: 50.0000 mg | ORAL_TABLET | ORAL | 1 refills | Status: DC | PRN

## 2022-12-05 ENCOUNTER — Other Ambulatory Visit: Payer: Self-pay | Admitting: Obstetrics and Gynecology

## 2022-12-05 DIAGNOSIS — F419 Anxiety disorder, unspecified: Secondary | ICD-10-CM

## 2022-12-08 ENCOUNTER — Other Ambulatory Visit: Payer: Self-pay | Admitting: Obstetrics and Gynecology

## 2022-12-08 DIAGNOSIS — F419 Anxiety disorder, unspecified: Secondary | ICD-10-CM

## 2022-12-08 DIAGNOSIS — F410 Panic disorder [episodic paroxysmal anxiety] without agoraphobia: Secondary | ICD-10-CM

## 2022-12-08 MED ORDER — PAROXETINE HCL 20 MG PO TABS: ORAL_TABLET | ORAL | 1 refills | Status: DC

## 2022-12-08 MED ORDER — CLONAZEPAM 1 MG PO TABS: 1.0000 mg | ORAL_TABLET | Freq: Two times a day (BID) | ORAL | 0 refills | Status: DC | PRN

## 2022-12-08 NOTE — Progress Notes (Signed)
Rx paxil for panic attacks/anxiety since having to take klonopin several times wkly. Will RF klonopin as rescue med.

## 2022-12-08 NOTE — Progress Notes (Signed)
Rx RF prn anxiety. Starting paxil too

## 2022-12-24 ENCOUNTER — Other Ambulatory Visit: Payer: Self-pay

## 2022-12-24 ENCOUNTER — Emergency Department
Admission: EM | Admit: 2022-12-24 | Discharge: 2022-12-25 | Discharge status: Against Medical Advice | Payer: Medicaid Other | Attending: Emergency Medicine | Admitting: Emergency Medicine

## 2022-12-24 DIAGNOSIS — W19XXXA Unspecified fall, initial encounter: Secondary | ICD-10-CM

## 2022-12-24 DIAGNOSIS — Y92009 Unspecified place in unspecified non-institutional (private) residence as the place of occurrence of the external cause: Secondary | ICD-10-CM | POA: Insufficient documentation

## 2022-12-24 DIAGNOSIS — R531 Weakness: Secondary | ICD-10-CM | POA: Insufficient documentation

## 2022-12-24 DIAGNOSIS — W108XXA Fall (on) (from) other stairs and steps, initial encounter: Secondary | ICD-10-CM | POA: Insufficient documentation

## 2022-12-24 DIAGNOSIS — R258 Other abnormal involuntary movements: Secondary | ICD-10-CM | POA: Insufficient documentation

## 2022-12-24 DIAGNOSIS — R55 Syncope and collapse: Secondary | ICD-10-CM | POA: Insufficient documentation

## 2022-12-24 DIAGNOSIS — R519 Headache, unspecified: Secondary | ICD-10-CM | POA: Insufficient documentation

## 2022-12-24 DIAGNOSIS — R569 Unspecified convulsions: Secondary | ICD-10-CM

## 2022-12-24 NOTE — ED Triage Notes (Signed)
Ems states "she did appear to have a seizure in route"

## 2022-12-24 NOTE — ED Provider Notes (Incomplete)
[facility] Provider Note    Event Date/Time   First MD Initiated Contact with Patient 12/24/22 2334     (approximate)   History   Seizures   HPI {Remember to add pertinent medical, surgical, social, and/or OB history to HPI:1} Latoya Luna is a 37 y.o. female   who presents to the emergency department today after concerns for syncope/seizure-like episode and fall.  The patient states that this has happened to her before.  Typically appears to be related to her anxiety.  She has seen cardiology and neurology for this problem.  Has not had an episode in quite some time.  Tonight she did not feel particularly anxious although was discussing the suicide of her fianc roughly a year ago with her friends.  She was then at the top of her stairs when she had 1 of these episodes.  She did fall down the stairs.  Is complaining of some headache but denies any other significant pain. In addition the patient has felt a little off recently.  She denies any fevers but has just felt weak and tired.      Physical Exam   Triage Vital Signs: ED Triage Vitals  Enc Vitals Group     BP 12/24/22 2342 109/65     Pulse Rate 12/24/22 2342 96     Resp 12/24/22 2342 19     Temp 12/24/22 2346 99.1 F (37.3 C)     Temp src --      SpO2 12/24/22 2346 99 %     Weight 12/24/22 2348 141 lb (64 kg)     Height 12/24/22 2348 '5\' 7"'$  (1.702 m)     Head Circumference --      Peak Flow --      Pain Score 12/24/22 2344 6     Pain Loc --      Pain Edu? --      Excl. in [place]? --     Most recent vital signs: Vitals:   12/24/22 2342 12/24/22 2346  BP: 109/65   Pulse: 96   Resp: 19   Temp:  99.1 F (37.3 C)  SpO2:  99%   General: Awake, alert, oriented. CV:  Good peripheral perfusion. Regular rate and rhythm. Resp:  Normal effort. Lungs clear. Abd:  No distention. Non tender.    ED Results / Procedures / Treatments   Labs (all labs ordered are listed, but only abnormal  results are displayed) Labs Reviewed - No data to display   EKG  ***   RADIOLOGY *** {USE THE WORD "INTERPRETED"!! You MUST document your own interpretation of imaging, as well as the fact that you reviewed the radiologist's report!:1}   PROCEDURES:  Critical Care performed: {CriticalCareYesNo:19197::"Yes, see critical care procedure note(s)","No"}  Procedures   MEDICATIONS ORDERED IN ED: Medications - No data to display   IMPRESSION / MDM / [place] / ED COURSE  I reviewed the triage vital signs and the nursing notes.                              Differential diagnosis includes, but is not limited to, ***  Patient's presentation is most consistent with {EM COPA:27473}  *** {If the patient is on the monitor, remove the brackets and asterisks on the sentence below and remember to document it as a Procedure as well. Otherwise delete the sentence below:1} {**The patient is on the cardiac  monitor to evaluate for evidence of arrhythmia and/or significant heart rate changes.**} {Remember to include, when applicable, any/all of the following data: independent review of imaging independent review of labs (comment specifically on pertinent positives and negatives) review of specific prior hospitalizations, PCP/specialist notes, etc. discuss meds given and prescribed document any discussion with consultants (including hospitalists) any clinical decision tools you used and why (PECARN, NEXUS, etc.) did you consider admitting the patient? document social determinants of health affecting patient's care (homelessness, inability to follow up in a timely fashion, etc) document any pre-existing conditions increasing risk on current visit (e.g. diabetes and HTN increasing danger of high-risk chest pain/ACS) describes what meds you gave (especially parenteral) and why any other interventions?:1}     FINAL CLINICAL IMPRESSION(S) / ED DIAGNOSES   Final diagnoses:  None      Rx / DC Orders   ED Discharge Orders     None        Note:  This document was prepared using Dragon voice recognition software and may include unintentional dictation errors.

## 2022-12-24 NOTE — ED Triage Notes (Signed)
Ems states "called out for syncope and seizure at home. She was on the steps and fell down about 13-14 steps that are carpet. She has a hx of seizures with anxiety but never been dx with them. She is co of soreness and headache. Patient had an episode of anxiety prior to seizure."

## 2022-12-24 NOTE — ED Provider Notes (Signed)
[facility] Provider Note    Event Date/Time   First MD Initiated Contact with Patient 12/24/22 2334     (approximate)   History   Seizures   HPI  Latoya Luna is a 37 y.o. female   who presents to the emergency department today after concerns for syncope/seizure-like episode and fall.  The patient states that this has happened to her before.  Typically appears to be related to her anxiety.  She has seen cardiology and neurology for this problem.  Has not had an episode in quite some time.  Tonight she did not feel particularly anxious although was discussing the suicide of her fianc roughly a year ago with her friends.  She was then at the top of her stairs when she had 1 of these episodes.  She did fall down the stairs.  Is complaining of some headache but denies any other significant pain. In addition the patient has felt a little off recently.  She denies any fevers but has just felt weak and tired.  Physical Exam   Triage Vital Signs: ED Triage Vitals  Enc Vitals Group     BP 12/24/22 2342 109/65     Pulse Rate 12/24/22 2342 96     Resp 12/24/22 2342 19     Temp 12/24/22 2346 99.1 F (37.3 C)     Temp src --      SpO2 12/24/22 2346 99 %     Weight 12/24/22 2348 141 lb (64 kg)     Height 12/24/22 2348 '5\' 7"'$  (1.702 m)     Head Circumference --      Peak Flow --      Pain Score 12/24/22 2344 6     Pain Loc --      Pain Edu? --      Excl. in [place]? --     Most recent vital signs: Vitals:   12/24/22 2342 12/24/22 2346  BP: 109/65   Pulse: 96   Resp: 19   Temp:  99.1 F (37.3 C)  SpO2:  99%   General: Awake, alert, oriented. CV:  Good peripheral perfusion. Regular rate and rhythm. Resp:  Normal effort. Lungs clear. Abd:  No distention. Non tender.    ED Results / Procedures / Treatments   Labs (all labs ordered are listed, but only abnormal results are displayed) Labs Reviewed  BASIC METABOLIC PANEL - Abnormal; Notable for the  following components:      Result Value   Calcium 8.1 (*)    All other components within normal limits  URINALYSIS, ROUTINE W REFLEX MICROSCOPIC - Abnormal; Notable for the following components:   Color, Urine YELLOW (*)    APPearance CLEAR (*)    Hgb urine dipstick SMALL (*)    Bacteria, UA RARE (*)    All other components within normal limits  ETHANOL - Abnormal; Notable for the following components:   Alcohol, Ethyl (B) 193 (*)    All other components within normal limits  CBC WITH DIFFERENTIAL/PLATELET  URINE DRUG SCREEN, QUALITATIVE ([facility] ONLY)  POC URINE PREG, ED     EKG  I, Nance Pear, attending physician, personally viewed and interpreted this EKG  EKG Time: 0006 Rate: 100 Rhythm: sinus rhythm Axis: normal Intervals: qtc 498 QRS: narrow, q waves v1, v2 ST changes: no st elevation Impression: abnormal ekg  RADIOLOGY  I independently interpreted and visualized the CT head/cervical spine. My interpretation: No bleed, no fracture Radiology interpretation:  IMPRESSION:  No acute intracranial abnormality.    No cervical spine fracture.         PROCEDURES:  Critical Care performed: No  Procedures   MEDICATIONS ORDERED IN ED: Medications - No data to display   IMPRESSION / MDM / [place] / ED COURSE  I reviewed the triage vital signs and the nursing notes.                              Differential diagnosis includes, but is not limited to, intracranial process, electrolyte abnormality, drug induced.  Patient's presentation is most consistent with acute presentation with potential threat to life or bodily function.  The patient is on the cardiac monitor to evaluate for evidence of arrhythmia and/or significant heart rate changes.  Patient presented to the emergency department today because of concerns for a syncope versus seizure-like episode.  Patient has had these before.  The time my exam she is awake and alert not postictal.  She  is complaining of some headache after the fall.  CT head and cervical spine without any concerning abnormalities.  Blood work without concerning electrolyte abnormality. Patient was monitored for a number of hours without any concerning abnormalities, and patient without any further episodes. Given history of similar episodes in the past and without obvious etiology do not feel patient requires any further emergent work up. Will plan on discharging to follow up with primary care.     FINAL CLINICAL IMPRESSION(S) / ED DIAGNOSES   Final diagnoses:  Seizure-like activity ([place])  Syncope, unspecified syncope type  Fall, initial encounter     Note:  This document was prepared using Set designer software and may include unintentional dictation errors.    Nance Pear, MD 12/25/22 [phone]

## 2022-12-25 ENCOUNTER — Emergency Department: Payer: Medicaid Other

## 2022-12-25 LAB — URINE DRUG SCREEN, QUALITATIVE (ARMC ONLY)
Amphetamines, Ur Screen: NOT DETECTED
Barbiturates, Ur Screen: NOT DETECTED
Benzodiazepine, Ur Scrn: NOT DETECTED
Cannabinoid 50 Ng, Ur ~~LOC~~: NOT DETECTED
Cocaine Metabolite,Ur ~~LOC~~: NOT DETECTED
MDMA (Ecstasy)Ur Screen: NOT DETECTED
Methadone Scn, Ur: NOT DETECTED
Opiate, Ur Screen: NOT DETECTED
Phencyclidine (PCP) Ur S: NOT DETECTED
Tricyclic, Ur Screen: NOT DETECTED

## 2022-12-25 LAB — BASIC METABOLIC PANEL
Anion gap: 7 (ref 5–15)
BUN: 12 mg/dL (ref 6–20)
CO2: 24 mmol/L (ref 22–32)
Calcium: 8.1 mg/dL — ABNORMAL LOW (ref 8.9–10.3)
Chloride: 111 mmol/L (ref 98–111)
Creatinine, Ser: 0.65 mg/dL (ref 0.44–1.00)
GFR, Estimated: >60 mL/min (ref >60)
Glucose, Bld: 93 mg/dL (ref 70–99)
Potassium: 4 mmol/L (ref 3.5–5.1)
Sodium: 142 mmol/L (ref 135–145)

## 2022-12-25 LAB — URINALYSIS, ROUTINE W REFLEX MICROSCOPIC
Bilirubin Urine: NEGATIVE
Glucose, UA: NEGATIVE mg/dL
Ketones, ur: NEGATIVE mg/dL
Leukocytes,Ua: NEGATIVE
Nitrite: NEGATIVE
Protein, ur: NEGATIVE mg/dL
Specific Gravity, Urine: 1.014 (ref 1.005–1.030)
pH: 5 (ref 5.0–8.0)

## 2022-12-25 LAB — CBC WITH DIFFERENTIAL/PLATELET
Abs Immature Granulocytes: 0.02 10*3/uL (ref 0.00–0.07)
Basophils Absolute: 0 10*3/uL (ref 0.0–0.1)
Basophils Relative: 1 %
Eosinophils Absolute: 0.1 10*3/uL (ref 0.0–0.5)
Eosinophils Relative: 1 %
HCT: 37.1 % (ref 36.0–46.0)
Hemoglobin: 12.2 g/dL (ref 12.0–15.0)
Immature Granulocytes: 1 %
Lymphocytes Relative: 42 %
Lymphs Abs: 1.7 10*3/uL (ref 0.7–4.0)
MCH: 31.5 pg (ref 26.0–34.0)
MCHC: 32.9 g/dL (ref 30.0–36.0)
MCV: 95.9 fL (ref 80.0–100.0)
Monocytes Absolute: 0.2 10*3/uL (ref 0.1–1.0)
Monocytes Relative: 6 %
Neutro Abs: 2 10*3/uL (ref 1.7–7.7)
Neutrophils Relative %: 49 %
Platelets: 267 10*3/uL (ref 150–400)
RBC: 3.87 MIL/uL (ref 3.87–5.11)
RDW: 13.2 % (ref 11.5–15.5)
WBC: 4 10*3/uL (ref 4.0–10.5)
nRBC: 0 % (ref 0.0–0.2)

## 2022-12-25 LAB — ETHANOL: Alcohol, Ethyl (B): 193 mg/dL — ABNORMAL HIGH (ref <10)

## 2022-12-25 LAB — POC URINE PREG, ED: Preg Test, Ur: NEGATIVE

## 2022-12-25 NOTE — Discharge Instructions (Signed)
Please seek medical attention for any high fevers, chest pain, shortness of breath, change in behavior, persistent vomiting, bloody stool or any other new or concerning symptoms.  

## 2023-01-25 ENCOUNTER — Other Ambulatory Visit: Payer: Self-pay | Admitting: Obstetrics and Gynecology

## 2023-01-25 DIAGNOSIS — F419 Anxiety disorder, unspecified: Secondary | ICD-10-CM

## 2023-01-31 ENCOUNTER — Other Ambulatory Visit: Payer: Self-pay | Admitting: Obstetrics and Gynecology

## 2023-01-31 DIAGNOSIS — F419 Anxiety disorder, unspecified: Secondary | ICD-10-CM

## 2023-02-03 ENCOUNTER — Encounter: Payer: Self-pay | Admitting: Advanced Practice Midwife

## 2023-02-05 ENCOUNTER — Other Ambulatory Visit: Payer: Self-pay | Admitting: Obstetrics and Gynecology

## 2023-02-05 DIAGNOSIS — F419 Anxiety disorder, unspecified: Secondary | ICD-10-CM

## 2023-02-05 MED ORDER — CLONAZEPAM 1 MG PO TABS: 1.0000 mg | ORAL_TABLET | Freq: Two times a day (BID) | ORAL | 0 refills | Status: DC | PRN

## 2023-02-05 NOTE — Progress Notes (Signed)
Rx RF klonopin. Pt under increased stress

## 2023-02-21 ENCOUNTER — Other Ambulatory Visit (HOSPITAL_COMMUNITY)
Admission: RE | Admit: 2023-02-21 | Discharge: 2023-02-21 | Disposition: A | Discharge status: Home / Self Care | Payer: Medicaid Other | Source: Ambulatory Visit | Attending: Advanced Practice Midwife | Admitting: Advanced Practice Midwife

## 2023-02-21 ENCOUNTER — Encounter: Payer: Self-pay | Admitting: Advanced Practice Midwife

## 2023-02-21 ENCOUNTER — Ambulatory Visit: Payer: Medicaid Other | Admitting: Advanced Practice Midwife

## 2023-02-21 VITALS — BP 100/60 | Ht 67.0 in | Wt 145.0 lb

## 2023-02-21 DIAGNOSIS — F32A Depression, unspecified: Secondary | ICD-10-CM | POA: Diagnosis not present

## 2023-02-21 DIAGNOSIS — N898 Other specified noninflammatory disorders of vagina: Secondary | ICD-10-CM

## 2023-02-21 DIAGNOSIS — F419 Anxiety disorder, unspecified: Secondary | ICD-10-CM | POA: Diagnosis not present

## 2023-02-21 DIAGNOSIS — Z113 Encounter for screening for infections with a predominantly sexual mode of transmission: Secondary | ICD-10-CM | POA: Insufficient documentation

## 2023-02-21 DIAGNOSIS — R5383 Other fatigue: Secondary | ICD-10-CM

## 2023-02-21 DIAGNOSIS — Z13220 Encounter for screening for lipoid disorders: Secondary | ICD-10-CM

## 2023-02-21 DIAGNOSIS — Z0000 Encounter for general adult medical examination without abnormal findings: Secondary | ICD-10-CM

## 2023-02-21 DIAGNOSIS — Z131 Encounter for screening for diabetes mellitus: Secondary | ICD-10-CM

## 2023-02-21 DIAGNOSIS — Z1321 Encounter for screening for nutritional disorder: Secondary | ICD-10-CM

## 2023-02-22 LAB — COMPREHENSIVE METABOLIC PANEL
ALT: 18 IU/L (ref 0–32)
AST: 20 IU/L (ref 0–40)
Albumin/Globulin Ratio: 1.5 (ref 1.2–2.2)
Albumin: 4 g/dL (ref 3.9–4.9)
Alkaline Phosphatase: 32 IU/L — ABNORMAL LOW (ref 44–121)
BUN/Creatinine Ratio: 19 (ref 9–23)
BUN: 12 mg/dL (ref 6–20)
Bilirubin Total: 0.6 mg/dL (ref 0.0–1.2)
CO2: 22 mmol/L (ref 20–29)
Calcium: 9 mg/dL (ref 8.7–10.2)
Chloride: 101 mmol/L (ref 96–106)
Creatinine, Ser: 0.63 mg/dL (ref 0.57–1.00)
Globulin, Total: 2.7 g/dL (ref 1.5–4.5)
Glucose: 88 mg/dL (ref 70–99)
Potassium: 4.1 mmol/L (ref 3.5–5.2)
Sodium: 137 mmol/L (ref 134–144)
Total Protein: 6.7 g/dL (ref 6.0–8.5)
eGFR: 117 mL/min/{1.73_m2} (ref >59)

## 2023-02-22 LAB — LIPID PANEL WITH LDL/HDL RATIO
Cholesterol, Total: 192 mg/dL (ref 100–199)
HDL: 62 mg/dL (ref >39)
LDL Chol Calc (NIH): 111 mg/dL — ABNORMAL HIGH (ref 0–99)
LDL/HDL Ratio: 1.8 ratio (ref 0.0–3.2)
Triglycerides: 106 mg/dL (ref 0–149)
VLDL Cholesterol Cal: 19 mg/dL (ref 5–40)

## 2023-02-22 LAB — FSH/LH
FSH: <0.3 m[IU]/mL
LH: <0.3 m[IU]/mL

## 2023-02-22 LAB — CBC
Hematocrit: 37.3 % (ref 34.0–46.6)
Hemoglobin: 12.4 g/dL (ref 11.1–15.9)
MCH: 31.6 pg (ref 26.6–33.0)
MCHC: 33.2 g/dL (ref 31.5–35.7)
MCV: 95 fL (ref 79–97)
Platelets: 261 10*3/uL (ref 150–450)
RBC: 3.92 x10E6/uL (ref 3.77–5.28)
RDW: 13.1 % (ref 11.7–15.4)
WBC: 5 10*3/uL (ref 3.4–10.8)

## 2023-02-22 LAB — VITAMIN D 25 HYDROXY (VIT D DEFICIENCY, FRACTURES): Vit D, 25-Hydroxy: 57.1 ng/mL (ref 30.0–100.0)

## 2023-02-22 LAB — HGB A1C W/O EAG: Hgb A1c MFr Bld: 5.2 % (ref 4.8–5.6)

## 2023-02-22 LAB — ESTRADIOL: Estradiol: <5 pg/mL

## 2023-02-22 LAB — PROGESTERONE: Progesterone: 0.2 ng/mL

## 2023-02-22 LAB — TSH: TSH: 1.14 u[IU]/mL (ref 0.450–4.500)

## 2023-02-23 ENCOUNTER — Encounter: Payer: Self-pay | Admitting: Advanced Practice Midwife

## 2023-02-23 LAB — CERVICOVAGINAL ANCILLARY ONLY
Bacterial Vaginitis (gardnerella): NEGATIVE
Candida Glabrata: NEGATIVE
Candida Vaginitis: NEGATIVE
Chlamydia: NEGATIVE
Comment: NEGATIVE
Comment: NEGATIVE
Comment: NEGATIVE
Comment: NEGATIVE
Comment: NEGATIVE
Comment: NORMAL
Neisseria Gonorrhea: NEGATIVE
Trichomonas: NEGATIVE

## 2023-02-23 NOTE — Progress Notes (Signed)
Patient ID: Latoya Luna, female   DOB: March 13, 1986, 37 y.o.   MRN: [id number]  Reason for Consult: Labs Only ( Pt reports  I was wanting to make an appt to get some blood work done and get just everything checked. Like hormone levels exc.  I been feeling so off last few months very tired, no motivation. )    Subjective:  Date of Service: 02/21/2023  HPI:  Latoya Luna is a 37 y.o. female being seen for concerns of worsening anxiety and depression symptoms over the past few months. She reports, difficulty sleeping,  decreased motivation, hard to get out of bed and a struggle to function. She does "function" well enough to take care of her children and do her job. She was seeing a therapist and then missed a visit. She has been unable to schedule another visit since then. She recently restarted her Paxil- around 2 weeks ago. "No big changes" since then. Reviewed need to stay on medication continuously for a period of time for effectiveness. She requests lab "workup" to see if that could be the reason for "feeling off".   She usually has a healthy diet, she walks some, takes a vitamin C/D/Zinc supplement and a probiotic. She also mentions slight vaginal irritation and requests vaginitis screen.  Past Medical History:  Diagnosis Date   Anxiety    BRCA negative 02/2021   MyRisk neg   Cancer ([place])    cervical cells   Depression    Family history of breast cancer    Increased risk of breast cancer 02/2021   IBIS=23%/riskscore=33%   Seizures (H[facility])    last seizure 5 years ago   Family History  Problem Relation Age of Onset   Prostate cancer Father 72   Breast cancer Paternal Grandmother 59   Breast cancer Paternal Aunt 8   Pancreatic cancer Maternal Aunt 75   Past Surgical History:  Procedure Laterality Date   BREAST SURGERY      Short Social History:  Social History   Tobacco Use   Smoking status: Never   Smokeless tobacco: Never  Substance Use Topics   Alcohol use:  Yes    Comment: occ    No Known Allergies  Current Outpatient Medications  Medication Sig Dispense Refill   clonazePAM (KLONOPIN) 1 MG tablet Take 1 tablet (1 mg total) by mouth 2 (two) times daily as needed. for anxiety 30 tablet 0   JOLESSA 0.15-0.03 MG tablet Take 1 tablet by mouth daily.     Multiple Vitamin (MULTIVITAMIN WITH MINERALS) TABS tablet Take 1 tablet by mouth daily.     PARoxetine (PAXIL) 20 MG tablet Take 1/2 tab daily for 6 days, then 1 tab daily 30 tablet 1   SUMAtriptan (IMITREX) 50 MG tablet Take 1 tablet (50 mg total) by mouth every 2 (two) hours as needed for migraine. May repeat in 2 hours if headache persists or recurs. 10 tablet 1   valACYclovir (VALTREX) 1000 MG tablet Take 1 tablet by mouth once daily 30 tablet 5   No current facility-administered medications for this visit.    Review of Systems  Constitutional:  Positive for malaise/fatigue. Negative for chills and fever.  HENT:  Negative for congestion, ear discharge, ear pain, hearing loss, sinus pain and sore throat.   Eyes:  Negative for blurred vision and double vision.  Respiratory:  Negative for cough, shortness of breath and wheezing.   Cardiovascular:  Negative for chest pain, palpitations and leg swelling.  Gastrointestinal:  Negative for abdominal pain, blood in stool, constipation, diarrhea, heartburn, melena, nausea and vomiting.  Genitourinary:  Negative for dysuria, flank pain, frequency, hematuria and urgency.  Musculoskeletal:  Negative for back pain, joint pain and myalgias.  Skin:  Negative for itching and rash.  Neurological:  Negative for dizziness, tingling, tremors, sensory change, speech change, focal weakness, seizures, loss of consciousness, weakness and headaches.  Endo/Heme/Allergies:  Negative for environmental allergies. Does not bruise/bleed easily.  Psychiatric/Behavioral:  Positive for depression. Negative for hallucinations, memory loss, substance abuse and suicidal ideas.  The patient is not nervous/anxious and does not have insomnia.        Positive for anxiety       Objective:  Objective   Vitals:   02/21/23 1501  BP: 100/60  Weight: 145 lb (65.8 kg)  Height: 5\' 7"  (1.702 m)   Body mass index is 22.71 kg/m. Constitutional: Well nourished, well developed female in no acute distress.  HEENT: normal Skin: Warm and dry.  Cardiovascular: Regular rate and rhythm.   Extremity:  no edema   Respiratory: Clear to auscultation bilateral. Normal respiratory effort Psych: Alert and Oriented x3. No memory deficits. Normal mood and affect.  MS: normal gait, normal bilateral lower extremity ROM/strength/stability.  Pelvic exam: patient self swabbed aptima   Data:  Latest Reference Range & Units 02/21/23 15:58  COMPREHENSIVE METABOLIC PANEL  Rpt !  Sodium 134 - 144 mmol/L 137  Potassium 3.5 - 5.2 mmol/L 4.1  Chloride 96 - 106 mmol/L 101  CO2 20 - 29 mmol/L 22  Glucose 70 - 99 mg/dL 88  BUN 6 - 20 mg/dL 12  Creatinine 0.57 - 1.00 mg/dL 0.63  Calcium 8.7 - 10.2 mg/dL 9.0  BUN/Creatinine Ratio 9 - 23  19  eGFR >59 mL/min/1.73 117  Alkaline Phosphatase 44 - 121 IU/L 32 (L)  Albumin 3.9 - 4.9 g/dL 4.0  Albumin/Globulin Ratio 1.2 - 2.2  1.5  AST 0 - 40 IU/L 20  ALT 0 - 32 IU/L 18  Total Protein 6.0 - 8.5 g/dL 6.7  Total Bilirubin 0.0 - 1.2 mg/dL 0.6  Cholesterol, Total 100 - 199 mg/dL 192  HDL Cholesterol >39 mg/dL 62  LDL/HDL Ratio 0.0 - 3.2 ratio 1.8  Triglycerides 0 - 149 mg/dL 106  VLDL Cholesterol Cal 5 - 40 mg/dL 19  LDL Chol Calc (NIH) 0 - 99 mg/dL 111 (H)  Vitamin D, 25-Hydroxy 30.0 - 100.0 ng/mL 57.1  Globulin, Total 1.5 - 4.5 g/dL 2.7  WBC 3.4 - 10.8 x10E3/uL 5.0  RBC 3.77 - 5.28 x10E6/uL 3.92  Hemoglobin 11.1 - 15.9 g/dL 12.4  HCT 34.0 - 46.6 % 37.3  MCV 79 - 97 fL 95  MCH 26.6 - 33.0 pg 31.6  MCHC 31.5 - 35.7 g/dL 33.2  RDW 11.7 - 15.4 % 13.1  Platelets 150 - 450 x10E3/uL 261  LH mIU/mL <0.3  FSH mIU/mL <0.3  Hemoglobin A1C 4.8  - 5.6 % 5.2  Estradiol pg/mL <5.0  Progesterone ng/mL 0.2  TSH 0.450 - 4.500 uIU/mL 1.140  !: Data is abnormal (L): Data is abnormally low (H): Data is abnormally high Rpt: View report in Results Review for more information  Aptima/vaginitis results pending  Assessment/Plan:     37 y.o. G3 P87 female with worsening anxiety/depression  Continue taking Paxil as prescribed/follow up with [facility] therapy encouraged Labs today: CBC, CMP, Hgb A1C, Vitamin D, TSH, Lipid panel, FSH/LH, Estradiol, Progesterone, Aptima/vaginitis Message to patient regarding results and  suggested hormone supplementation. Awaiting patient response.   [facility] 02/23/2023, 1:06 PM

## 2023-02-23 NOTE — Patient Instructions (Signed)
Managing Depression, Adult Depression is a mental health condition that affects your thoughts, feelings, and actions. Being diagnosed with depression can bring you relief if you did not know why you have felt or behaved a certain way. It could also leave you feeling overwhelmed. Finding ways to manage your symptoms can help you feel more positive about your future. How to manage lifestyle changes Being depressed is difficult. Depression can increase the level of everyday stress. Stress can make depression symptoms worse. You may believe your symptoms cannot be managed or will never improve. However, there are many things you can try to help manage your symptoms. There is hope. Managing stress  Stress is your body's reaction to life changes and events, both good and bad. Stress can add to your feelings of depression. Learning to manage your stress can help lessen your feelings of depression. Try some of the following approaches to reducing your stress (stress reduction techniques): Listen to music that you enjoy and that inspires you. Try using a meditation app or take a meditation class. Develop a practice that helps you connect with your spiritual self. Walk in nature, pray, or go to a place of worship. Practice deep breathing. To do this, inhale slowly through your nose. Pause at the top of your inhale for a few seconds and then exhale slowly, letting yourself relax. Repeat this three or four times. Practice yoga to help relax and work your muscles. Choose a stress reduction technique that works for you. These techniques take time and practice to develop. Set aside 5-15 minutes a day to do them. Therapists can offer training in these techniques. Do these things to help manage stress: Keep a journal. Know your limits. Set healthy boundaries for yourself and others, such as saying "no" when you think something is too much. Pay attention to how you react to certain situations. You may not be able to  control everything, but you can change your reaction. Add humor to your life by watching funny movies or shows. Make time for activities that you enjoy and that relax you. Spend less time using electronics, especially at night before bed. The light from screens can make your brain think it is time to get up rather than go to bed.  Medicines Medicines, such as antidepressants, are often a part of treatment for depression. Talk with your pharmacist or health care provider about all the medicines, supplements, and herbal products that you take, their possible side effects, and what medicines and other products are safe to take together. Make sure to report any side effects you may have to your health care provider. Relationships Your health care provider may suggest family therapy, couples therapy, or individual therapy as part of your treatment. How to recognize changes Everyone responds differently to treatment for depression. As you recover from depression, you may start to: Have more interest in doing activities. Feel more hopeful. Have more energy. Eat a more regular amount of food. Have better mental focus. It is important to recognize if your depression is not getting better or is getting worse. The symptoms you had in the beginning may return, such as: Feeling tired. Eating too much or too little. Sleeping too much or too little. Feeling restless, agitated, or hopeless. Trouble focusing or making decisions. Having unexplained aches and pains. Feeling irritable, angry, or aggressive. If you or your family members notice these symptoms coming back, let your health care provider know right away. Follow these instructions at home: Activity Try to   get some form of exercise each day, such as walking. Try yoga, mindfulness, or other stress reduction techniques. Participate in group activities if you are able. Lifestyle Get enough sleep. Cut down on or stop using caffeine, tobacco,  alcohol, and any other harmful substances. Eat a healthy diet that includes plenty of vegetables, fruits, whole grains, low-fat dairy products, and lean protein. Limit foods that are high in solid fats, added sugar, or salt (sodium). General instructions Take over-the-counter and prescription medicines only as told by your health care provider. Keep all follow-up visits. It is important for your health care provider to check on your mood, behavior, and medicines. Your health care provider may need to make changes to your treatment. Where to find support Talking to others  Friends and family members can be sources of support and guidance. Talk to trusted friends or family members about your condition. Explain your symptoms and let them know that you are working with a health care provider to treat your depression. Tell friends and family how they can help. Finances Find mental health providers that fit with your financial situation. Talk with your health care provider if you are worried about access to food, housing, or medicine. Call your insurance company to learn about your co-pays and prescription plan. Where to find more information You can find support in your area from: Anxiety and Depression Association of America (ADAA): adaa.org Mental Health America: mentalhealthamerica.net [employer] on Mental Illness: nami.org Contact a health care provider if: You stop taking your antidepressant medicines, and you have any of these symptoms: Nausea. Headache. Light-headedness. Chills and body aches. Not being able to sleep (insomnia). You or your friends and family think your depression is getting worse. Get help right away if: You have thoughts of hurting yourself or others. Get help right away if you feel like you may hurt yourself or others, or have thoughts about taking your own life. Go to your nearest emergency room or: Call 911. Call the [facility] at  [phone] or 988. This is open 24 hours a day. Text the Crisis Text Line at 850-415-6446. This information is not intended to replace advice given to you by your health care provider. Make sure you discuss any questions you have with your health care provider. Document Revised: 03/22/2022 Document Reviewed: 03/22/2022 Elsevier Patient Education  [place], Adult After being diagnosed with anxiety, you may be relieved to know why you have felt or behaved a certain way. You may also feel overwhelmed about the treatment ahead and what it will mean for your life. With care and support, you can manage your anxiety. How to manage lifestyle changes Understanding the difference between stress and anxiety Although stress can play a role in anxiety, it is not the same as anxiety. Stress is your body's reaction to life changes and events, both good and bad. Stress is often caused by something external, such as a deadline, test, or competition. It normally goes away after the event has ended and will last just a few hours. But, stress can be ongoing and can lead to more than just stress. Anxiety is caused by something internal, such as imagining a terrible outcome or worrying that something will go wrong that will greatly upset you. Anxiety often does not go away even after the event is over, and it can become a long-term (chronic) worry. Lowering stress and anxiety Talk with your health care provider or a counselor to learn more about  lowering anxiety and stress. They may suggest tension-reduction techniques, such as: Music. Spend time creating or listening to music that you enjoy and that inspires you. Mindfulness-based meditation. Practice being aware of your normal breaths while not trying to control your breathing. It can be done while sitting or walking. Centering prayer. Focus on a word, phrase, or sacred image that means something to you and brings you peace. Deep breathing.  Expand your stomach and inhale slowly through your nose. Hold your breath for 3-5 seconds. Then breathe out slowly, letting your stomach muscles relax. Self-talk. Learn to notice and spot thought patterns that lead to anxiety reactions. Change those patterns to thoughts that feel peaceful. Muscle relaxation. Take time to tense muscles and then relax them. Choose a tension-reduction technique that fits your lifestyle and personality. These techniques take time and practice. Set aside 5-15 minutes a day to do them. Specialized therapists can offer counseling and training in these techniques. The training to help with anxiety may be covered by some insurance plans. Other things you can do to manage stress and anxiety include: Keeping a stress diary. This can help you learn what triggers your reaction and then learn ways to manage your response. Thinking about how you react to certain situations. You may not be able to control everything, but you can control your response. Making time for activities that help you relax and not feeling guilty about spending your time in this way. Doing visual imagery. This involves imagining or creating mental pictures to help you relax. Practicing yoga. Through yoga poses, you can lower tension and relax.  Medicines Medicines for anxiety include: Antidepressant medicines. These are usually prescribed for long-term daily control. Anti-anxiety medicines. These may be added in severe cases, especially when panic attacks occur. When used together, medicines, psychotherapy, and tension-reduction techniques may be the most effective treatment. Relationships Relationships can play a big part in helping you recover. Spend more time connecting with trusted friends and family members. Think about going to couples counseling if you have a partner, taking family education classes, or going to family therapy. Therapy can help you and others better understand your anxiety. How to  recognize changes in your anxiety Everyone responds differently to treatment for anxiety. Recovery from anxiety happens when symptoms lessen and stop interfering with your daily life at home or work. This may mean that you will start to: Have better concentration and focus. Worry will interfere less in your daily thinking. Sleep better. Be less irritable. Have more energy. Have improved memory. Try to recognize when your condition is getting worse. Contact your provider if your symptoms interfere with home or work and you feel like your condition is not improving. Follow these instructions at home: Activity Exercise. Adults should: Exercise for at least 150 minutes each week. The exercise should increase your heart rate and make you sweat (moderate-intensity exercise). Do strengthening exercises at least twice a week. Get the right amount and quality of sleep. Most adults need 7-9 hours of sleep each night. Lifestyle  Eat a healthy diet that includes plenty of vegetables, fruits, whole grains, low-fat dairy products, and lean protein. Do not eat a lot of foods that are high in fats, added sugars, or salt (sodium). Make choices that simplify your life. Do not use any products that contain nicotine or tobacco. These products include cigarettes, chewing tobacco, and vaping devices, such as e-cigarettes. If you need help quitting, ask your provider. Avoid caffeine, alcohol, and certain over-the-counter cold medicines. These  may make you feel worse. Ask your pharmacist which medicines to avoid. General instructions Take over-the-counter and prescription medicines only as told by your provider. Keep all follow-up visits. This is to make sure you are managing your anxiety well or if you need more support. Where to find support You can get help and support from: Self-help groups. Online and [employer]. A trusted spiritual leader. Couples counseling. Family education  classes. Family therapy. Where to find more information You may find that joining a support group helps you deal with your anxiety. The following sources can help you find counselors or support groups near you: [place]: mentalhealthamerica.net Anxiety and Depression Association of [place] (ADAA): adaa.[place] on Mental Illness (NAMI): nami.org Contact a health care provider if: You have a hard time staying focused or finishing tasks. You spend many hours a day feeling worried about everyday life. You are very tired because you cannot stop worrying. You start to have headaches or often feel tense. You have chronic nausea or diarrhea. Get help right away if: Your heart feels like it is racing. You have shortness of breath. You have thoughts of hurting yourself or others. Get help right away if you feel like you may hurt yourself or others, or have thoughts about taking your own life. Go to your nearest emergency room or: Call 911. Call the [facility] at ([phone] or 988. This is open 24 hours a day. Text the Crisis Text Line at 517-024-6950. This information is not intended to replace advice given to you by your health care provider. Make sure you discuss any questions you have with your health care provider. Document Revised: 08/23/2022 Document Reviewed: 03/07/2021 Elsevier Patient Education  [place].

## 2023-03-11 ENCOUNTER — Other Ambulatory Visit: Payer: Self-pay | Admitting: Advanced Practice Midwife

## 2023-03-11 DIAGNOSIS — N951 Menopausal and female climacteric states: Secondary | ICD-10-CM

## 2023-03-11 MED ORDER — PREMPHASE 0.625-5 MG PO TABS: 1.0000 | ORAL_TABLET | Freq: Every day | ORAL | 5 refills | Status: DC

## 2023-03-11 NOTE — Progress Notes (Signed)
Message to patient regarding new Rx for Premphase and discontinuation of Jolessa.

## 2023-03-13 ENCOUNTER — Other Ambulatory Visit: Payer: Self-pay

## 2023-03-13 ENCOUNTER — Emergency Department: Payer: Medicaid Other

## 2023-03-13 ENCOUNTER — Emergency Department
Admission: EM | Admit: 2023-03-13 | Discharge: 2023-03-13 | Disposition: A | Discharge status: Home / Self Care | Payer: Medicaid Other | Attending: Emergency Medicine | Admitting: Emergency Medicine

## 2023-03-13 DIAGNOSIS — R0789 Other chest pain: Secondary | ICD-10-CM | POA: Diagnosis not present

## 2023-03-13 DIAGNOSIS — Z8541 Personal history of malignant neoplasm of cervix uteri: Secondary | ICD-10-CM | POA: Insufficient documentation

## 2023-03-13 DIAGNOSIS — R258 Other abnormal involuntary movements: Secondary | ICD-10-CM | POA: Diagnosis present

## 2023-03-13 DIAGNOSIS — D72829 Elevated white blood cell count, unspecified: Secondary | ICD-10-CM | POA: Insufficient documentation

## 2023-03-13 DIAGNOSIS — R569 Unspecified convulsions: Secondary | ICD-10-CM

## 2023-03-13 LAB — CBC
HCT: 38.1 % (ref 36.0–46.0)
Hemoglobin: 13 g/dL (ref 12.0–15.0)
MCH: 32.3 pg (ref 26.0–34.0)
MCHC: 34.1 g/dL (ref 30.0–36.0)
MCV: 94.8 fL (ref 80.0–100.0)
Platelets: 258 10*3/uL (ref 150–400)
RBC: 4.02 MIL/uL (ref 3.87–5.11)
RDW: 12.6 % (ref 11.5–15.5)
WBC: 3.8 10*3/uL — ABNORMAL LOW (ref 4.0–10.5)
nRBC: 0 % (ref 0.0–0.2)

## 2023-03-13 LAB — BASIC METABOLIC PANEL
Anion gap: 10 (ref 5–15)
BUN: 12 mg/dL (ref 6–20)
CO2: 22 mmol/L (ref 22–32)
Calcium: 8.7 mg/dL — ABNORMAL LOW (ref 8.9–10.3)
Chloride: 107 mmol/L (ref 98–111)
Creatinine, Ser: 0.57 mg/dL (ref 0.44–1.00)
GFR, Estimated: >60 mL/min (ref >60)
Glucose, Bld: 89 mg/dL (ref 70–99)
Potassium: 3.7 mmol/L (ref 3.5–5.1)
Sodium: 139 mmol/L (ref 135–145)

## 2023-03-13 LAB — TROPONIN I (HIGH SENSITIVITY): Troponin I (High Sensitivity): 3 ng/L (ref <18)

## 2023-03-13 LAB — POC URINE PREG, ED: Preg Test, Ur: NEGATIVE

## 2023-03-13 MED ORDER — ACETAMINOPHEN 500 MG PO TABS
1000.0000 mg | ORAL_TABLET | Freq: Once | ORAL | Status: AC
  Administered 2023-03-13: 1000 mg via ORAL
  Filled 2023-03-13: qty 2

## 2023-03-13 MED ORDER — SUMATRIPTAN SUCCINATE 50 MG PO TABS
50.0000 mg | ORAL_TABLET | Freq: Once | ORAL | Status: AC
  Administered 2023-03-13: 50 mg via ORAL
  Filled 2023-03-13: qty 1

## 2023-03-13 NOTE — ED Triage Notes (Signed)
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 Pt told EMS was also having PTSD reaction to recent home stress and hx of finding a family member who had committed suicide  No seizures witnessed by EMS and pt was ambulatory. Alert and oriented  12 lead was normal, VSS, CBG 82   Home meds include aprazolam, clonazepam, acyclovir. Pt states does not take anything for seizures, just anxiety.  Pt states that roommate told her she had stopped breathing in sleep and that he did CPR on her. Pt unsure how long this lasted. Pt states she did lose consciousness. Pt also endorses L chest pain (sharp) and SOB.

## 2023-03-13 NOTE — ED Provider Notes (Signed)
[facility] Provider Note    Event Date/Time   First MD Initiated Contact with Patient 03/13/23 1349     (approximate)   History   Seizures and Chest Pain   HPI  Latoya Luna is a 37 y.o. female past medical history of anxiety and seizure-like activity who presents with concern for seizure activity.  Patient's partner who is at bedside tells me that this morning when he went to wake her she was difficult to arouse-was in a deep sleep.  He then went to work and their daughter later called him saying that she was having seizure-like activity.  He then came home and she was having shaking in the upper extremities had bitten her tongue and had stopped breathing he gave her rescue breaths x 2.  He thinks this all could have lasted for about an hour.  When she woke up she immediately said that she had pain in the left shoulder and chest.  Patient has had a migraine headache over the last several days.  She is out of her Imitrex which her neurologist has prescribed to her.  Does feel like she is under good amount of stress and has been told that her prior seizure-like activity episodes are brought on by stress.  She is endorsing pain in the left shoulder and chest region no dyspnea.  No history of DVT/PE.  Patient has had similar seizure-like episodes in the past but typically not occurring when she is in sleep and not occurring this long.  Has been seen by neurologist last several months ago with negative EEG.  She is not on any antiepileptics.    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 --      Excl. in GC? --     Most recent vital signs: Vitals:   03/13/23 1530 03/13/23 1535  BP:  113/83  Pulse:    Resp: (!) 22 17  Temp:    SpO2:       General: Awake, no distress.  CV:  Good peripheral perfusion.  Resp:  Normal effort.  Abd:  No distention.  Neuro:  Awake, Alert, Oriented x 3  Other:  Aox3, nml speech  PERRL, EOMI, face symmetric, nml tongue movement  5/5 strength in the BL upper and lower extremities  Sensation grossly intact in the BL upper and lower extremities  Finger-nose-finger intact BL Mild tenderness palpation of left anterior chest wall and pain is reproduced with abduction internal rotation of shoulder   ED Results / Procedures / Treatments  Labs (all labs ordered are listed, but only abnormal results are displayed) Labs Reviewed  BASIC METABOLIC PANEL - Abnormal; Notable for the following components:      Result Value   Calcium 8.7 (*)    All other components within normal limits  CBC - Abnormal; Notable for the following components:   WBC 3.8 (*)    All other components within normal limits  POC URINE PREG, ED  TROPONIN I (HIGH SENSITIVITY)     EKG  EKG interpretation performed by myself: NSR, nml axis, nml intervals, no acute ischemic changes    RADIOLOGY I reviewed and interpreted the CT scan of the brain which does not show any acute intracranial  process    PROCEDURES:  Critical Care performed: No  Procedures   MEDICATIONS ORDERED IN ED: Medications  SUMAtriptan (IMITREX) tablet 50 mg (50 mg Oral Given 03/13/23 1540)  acetaminophen (TYLENOL) tablet 1,000 mg (1,000 mg Oral Given 03/13/23 1531)     IMPRESSION / MDM / ASSESSMENT AND PLAN / ED COURSE  I reviewed the triage vital signs and the nursing notes.                              Patient's presentation is most consistent with acute complicated illness / injury requiring diagnostic workup.  Differential diagnosis includes, but is not limited to, seizure, pseudoseizure, intracranial hemorrhage, intracranial mass, musculoskeletal chest pain, low suspicion for ACS  The patient is a 36 year old female presents with seizure-like activity.  Per her husband she had about an hour of shaking in the upper extremities associated with tongue biting and eyes rolled back and stopping breathing.  When patient woke up she complained of shoulder and left chest pain sounds that she did not have a postictal period.  On arrival to ED patient is mentating normally with intact neurologic exam.  Vital signs are stable.  She does endorse ongoing pain in the left chest and left shoulder.  EKG is nonischemic and troponin is negative.  Her pain does seem to be reproduced with palpation of the chest wall as well as in the range of the shoulder and I suspect that this is musculoskeletal and low suspicion for ACS or PE or other life-threatening process.  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  CT head is negative.  Screening labs also to include CBC and BMP and pregnancy test are reassuring.  She is mildly leukopenic with a white count of 3.8.  Did have an EEG several months ago that was negative and neurology did not start her on any antiepileptics.  Unclear whether this episode today was truly seizures.   Given that it lasted for about an hour I would expect that if this truly was a seizure it would have been unlikely to stop on its own and patient would be much more ill.  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  I did recommend that she reach out to her neurologist.  Will not start any additional medications at this time.  I did give her a dose of Imitrex and Tylenol for her headache which is similar to prior migraines.       FINAL CLINICAL IMPRESSION(S) / ED DIAGNOSES   Final diagnoses:  Seizure-like activity     Rx / DC Orders   ED Discharge Orders     None        Note:  This document was prepared using Dragon voice recognition software and may include unintentional dictation errors.   Georga Hacking, MD 03/13/23 [phone]

## 2023-03-13 NOTE — ED Notes (Signed)
Pt. Up to bathroom to obtain urine specimen. Gait steady, NAD.

## 2023-03-22 ENCOUNTER — Other Ambulatory Visit: Payer: Self-pay | Admitting: Neurology

## 2023-04-05 ENCOUNTER — Telehealth: Payer: Medicaid Other | Admitting: Nurse Practitioner

## 2023-04-05 DIAGNOSIS — B3731 Acute candidiasis of vulva and vagina: Secondary | ICD-10-CM

## 2023-04-05 MED ORDER — FLUCONAZOLE 150 MG PO TABS
150.0000 mg | ORAL_TABLET | Freq: Once | ORAL | 0 refills | Status: AC
Start: 2023-04-05 — End: 2023-04-05

## 2023-04-05 NOTE — Progress Notes (Signed)

## 2023-04-15 ENCOUNTER — Encounter: Payer: Self-pay | Admitting: Obstetrics and Gynecology

## 2023-04-15 ENCOUNTER — Other Ambulatory Visit: Payer: Self-pay | Admitting: Obstetrics and Gynecology

## 2023-04-15 ENCOUNTER — Other Ambulatory Visit: Payer: Self-pay | Admitting: Advanced Practice Midwife

## 2023-04-15 DIAGNOSIS — F419 Anxiety disorder, unspecified: Secondary | ICD-10-CM

## 2023-04-15 DIAGNOSIS — B009 Herpesviral infection, unspecified: Secondary | ICD-10-CM

## 2023-04-16 ENCOUNTER — Encounter: Payer: Self-pay | Admitting: Advanced Practice Midwife

## 2023-04-17 ENCOUNTER — Other Ambulatory Visit: Payer: Self-pay | Admitting: Obstetrics and Gynecology

## 2023-04-17 DIAGNOSIS — B009 Herpesviral infection, unspecified: Secondary | ICD-10-CM

## 2023-04-17 DIAGNOSIS — F419 Anxiety disorder, unspecified: Secondary | ICD-10-CM

## 2023-04-17 MED ORDER — CLONAZEPAM 1 MG PO TABS
1.0000 mg | ORAL_TABLET | Freq: Two times a day (BID) | ORAL | 0 refills | Status: DC | PRN
Start: 2023-04-17 — End: 2023-06-14

## 2023-04-17 MED ORDER — VALACYCLOVIR HCL 1 G PO TABS
1000.0000 mg | ORAL_TABLET | Freq: Every day | ORAL | 0 refills | Status: DC
Start: 2023-04-17 — End: 2023-04-27

## 2023-04-17 NOTE — Progress Notes (Signed)
Rx RF valtrex daily, klonopin prn. Annual due

## 2023-04-27 ENCOUNTER — Encounter: Payer: Self-pay | Admitting: Licensed Practical Nurse

## 2023-04-27 ENCOUNTER — Ambulatory Visit (INDEPENDENT_AMBULATORY_CARE_PROVIDER_SITE_OTHER): Payer: Medicaid Other | Admitting: Licensed Practical Nurse

## 2023-04-27 ENCOUNTER — Telehealth: Payer: Self-pay | Admitting: Advanced Practice Midwife

## 2023-04-27 ENCOUNTER — Other Ambulatory Visit (HOSPITAL_COMMUNITY)
Admission: RE | Admit: 2023-04-27 | Discharge: 2023-04-27 | Disposition: A | Discharge status: Home / Self Care | Payer: Medicaid Other | Source: Ambulatory Visit | Attending: Licensed Practical Nurse | Admitting: Licensed Practical Nurse

## 2023-04-27 VITALS — BP 112/77 | HR 86 | Ht 67.0 in | Wt 147.0 lb

## 2023-04-27 DIAGNOSIS — B009 Herpesviral infection, unspecified: Secondary | ICD-10-CM | POA: Diagnosis not present

## 2023-04-27 DIAGNOSIS — N898 Other specified noninflammatory disorders of vagina: Secondary | ICD-10-CM | POA: Insufficient documentation

## 2023-04-27 DIAGNOSIS — Z113 Encounter for screening for infections with a predominantly sexual mode of transmission: Secondary | ICD-10-CM | POA: Insufficient documentation

## 2023-04-27 MED ORDER — VALACYCLOVIR HCL 1 G PO TABS: 1000.0000 mg | ORAL_TABLET | Freq: Every day | ORAL | 3 refills | Status: DC

## 2023-04-27 MED ORDER — NYSTATIN-TRIAMCINOLONE 100000-0.1 UNIT/GM-% EX OINT
1.0000 | TOPICAL_OINTMENT | Freq: Two times a day (BID) | CUTANEOUS | 0 refills | Status: AC
Start: 2023-04-27 — End: 2023-05-04

## 2023-04-27 NOTE — Telephone Encounter (Signed)
The patient is scheduled for annual with Latoya Luna in July. The patient is requesting lab order for rechecking hormones. Please advise?

## 2023-04-27 NOTE — Progress Notes (Signed)
HPI:      [name] is a 37 y.o. [phone] who LMP was Patient's last menstrual period was 03/20/2023., presents today for a problem visit.  She complains of:  Vaginitis: Vaginal irritation x 1 week.  She recently has been on vacation,has been in bathing suits and not following her normal hygiene routine. Reports feeling irritated internally and externally. She had IC a few days ago, it was really uncomfortable, she admits she did not have adequate lubrication at that time either. Denies abnormal discharge or odor. Has been treated for BV in the past.  She had a virtual appointment in early May, was treated for suspected yeast with Diflucan. Her symptoms improved a little. She is currently treated for anxiety, had tried to stop her medication, which has made her anxiety worse, anxiety tends to trigger her HSV, she gets a tingling sensation in her thighs and pelvis, so then she takes 2 1,000mg  Valtrex at once for 3 days, when she does this, she does not get an out break.    PMHx: She  has a past medical history of Anxiety, BRCA negative (02/2021), Cancer [facility]), Depression, Family history of breast cancer, Increased risk of breast cancer (02/2021), and Seizures (HCC). Also,  has a past surgical history that includes Breast surgery., family history includes Breast cancer (age of onset: 69) in her paternal grandmother; Breast cancer (age of onset: 23) in her paternal aunt; Pancreatic cancer (age of onset: 56) in her maternal aunt; Prostate cancer (age of onset: 12) in her father.,  reports that she has never smoked. She has never used smokeless tobacco. She reports current alcohol use. She reports that she does not use drugs.  She has a current medication list which includes the following prescription(s): clonazepam, premphase, multivitamin with minerals, nystatin-triamcinolone ointment, paroxetine, sumatriptan, and valacyclovir. Also, has No Known Allergies.  ROS see HPI   Objective: BP 112/77    Pulse 86   Ht 5\' 7"  (1.702 m)   Wt 147 lb (66.7 kg)   LMP 03/20/2023   BMI 23.02 kg/m  Physical Exam Genitourinary:     Genitourinary Comments: External genitalia WNL no redness or swelling or lesions Inner labia red and irritated looking, thick white-green discharge present on perineum, swabs blindly collected.   Wet prep negative whiff, ph change, few clues cells neg trich or hyphea   Pulmonary:     Effort: Pulmonary effort is normal.  Abdominal:     Tenderness: There is no abdominal tenderness.  Neurological:     Mental Status: She is alert.  Skin:    General: Skin is warm.  Psychiatric:        Mood and Affect: Mood normal.     ASSESSMENT/PLAN:    Problem List Items Addressed This Visit       Other   Herpes simplex   Relevant Medications   nystatin-triamcinolone ointment (MYCOLOG)   valACYclovir (VALTREX) 1000 MG tablet   Other Visit Diagnoses     Vaginal irritation    -  Primary   Relevant Medications   nystatin-triamcinolone ointment (MYCOLOG)   Other Relevant Orders   Cervicovaginal ancillary only   Screening examination for venereal disease       Relevant Orders   Cervicovaginal ancillary only      -Suspect yeast, will treat with Mycolog, aptima sent -reviewed plan when pt feels an  possible HSV outbreak, per CDC app may take 1,000 mg every 12 hours for 5-10 days.   Carie Caddy,  CNM   [facility]  04/27/23  12:42 PM

## 2023-04-28 ENCOUNTER — Other Ambulatory Visit: Payer: Self-pay | Admitting: Licensed Practical Nurse

## 2023-04-28 DIAGNOSIS — B3731 Acute candidiasis of vulva and vagina: Secondary | ICD-10-CM

## 2023-04-28 DIAGNOSIS — N760 Acute vaginitis: Secondary | ICD-10-CM

## 2023-04-28 LAB — CERVICOVAGINAL ANCILLARY ONLY
Bacterial Vaginitis (gardnerella): POSITIVE — AB
Candida Glabrata: NEGATIVE
Candida Vaginitis: POSITIVE — AB
Chlamydia: NEGATIVE
Comment: NEGATIVE
Comment: NEGATIVE
Comment: NEGATIVE
Comment: NEGATIVE
Comment: NEGATIVE
Comment: NORMAL
Neisseria Gonorrhea: NEGATIVE
Trichomonas: NEGATIVE

## 2023-04-28 MED ORDER — METRONIDAZOLE 500 MG PO TABS
500.0000 mg | ORAL_TABLET | Freq: Two times a day (BID) | ORAL | 0 refills | Status: DC
Start: 2023-04-28 — End: 2023-04-28

## 2023-04-28 MED ORDER — FLUCONAZOLE 150 MG PO TABS
150.0000 mg | ORAL_TABLET | ORAL | 0 refills | Status: AC
Start: 2023-04-28 — End: 2023-05-02

## 2023-04-28 MED ORDER — CLINDAMYCIN HCL 300 MG PO CAPS
300.0000 mg | ORAL_CAPSULE | Freq: Two times a day (BID) | ORAL | 0 refills | Status: DC
Start: 2023-04-28 — End: 2023-06-22

## 2023-04-28 NOTE — Progress Notes (Signed)
Pt call review lab results, familiar with BV and yeast, requesting medication that is safe to use with alcohol, she has a trip planned next week that includes drinks already paid for.  Script for Clindamycin and Diflucan sent to pharmacy Carie Caddy, CNM   [facility]  04/28/23  5:18 PM

## 2023-05-02 ENCOUNTER — Other Ambulatory Visit: Payer: Self-pay | Admitting: Advanced Practice Midwife

## 2023-05-02 DIAGNOSIS — N951 Menopausal and female climacteric states: Secondary | ICD-10-CM

## 2023-05-02 NOTE — Progress Notes (Signed)
Orders placed for hormone labs- recheck

## 2023-05-08 NOTE — Telephone Encounter (Signed)
I contacted the patient via phone. I left message for the patient to call back to scheduled lab appointment only. JEG placed orders.

## 2023-05-10 ENCOUNTER — Other Ambulatory Visit: Payer: Self-pay | Admitting: Neurology

## 2023-06-06 ENCOUNTER — Other Ambulatory Visit: Payer: Self-pay

## 2023-06-06 ENCOUNTER — Other Ambulatory Visit: Payer: Medicaid Other

## 2023-06-06 DIAGNOSIS — Z3200 Encounter for pregnancy test, result unknown: Secondary | ICD-10-CM

## 2023-06-06 DIAGNOSIS — N951 Menopausal and female climacteric states: Secondary | ICD-10-CM

## 2023-06-07 ENCOUNTER — Encounter: Payer: Self-pay | Admitting: Advanced Practice Midwife

## 2023-06-07 LAB — FSH/LH
FSH: 2.7 m[IU]/mL
LH: 9.5 m[IU]/mL

## 2023-06-07 LAB — PROGESTERONE: Progesterone: 10.6 ng/mL

## 2023-06-07 LAB — BETA HCG QUANT (REF LAB): hCG Quant: <1 m[IU]/mL

## 2023-06-07 LAB — ESTRADIOL: Estradiol: 160 pg/mL

## 2023-06-14 ENCOUNTER — Other Ambulatory Visit: Payer: Self-pay | Admitting: Obstetrics and Gynecology

## 2023-06-14 DIAGNOSIS — F419 Anxiety disorder, unspecified: Secondary | ICD-10-CM

## 2023-06-14 MED ORDER — CLONAZEPAM 1 MG PO TABS: 1.0000 mg | ORAL_TABLET | Freq: Two times a day (BID) | ORAL | 0 refills | Status: DC | PRN

## 2023-06-15 ENCOUNTER — Other Ambulatory Visit: Payer: Self-pay | Admitting: Neurology

## 2023-06-15 MED ORDER — SUMATRIPTAN SUCCINATE 50 MG PO TABS: ORAL_TABLET | ORAL | 0 refills | Status: DC

## 2023-06-20 NOTE — Progress Notes (Unsigned)
PCP:  [facility]   No chief complaint on file.    HPI:      [name] is a 37 y.o. [phone] whose LMP was No LMP recorded. (Menstrual status: Oral contraceptives)., presents today for her annual examination.  Her menses are {norm/abn:715}, lasting {number: 22536} days.  Dysmenorrhea {dysmen:716}. She {does:18564} have intermenstrual bleeding. Was having perimneopausal sx, had low hormone levels 3/24, given premphase by [facility] Plan  Sex activity: {sex active: 315163}.  Last Pap: 03/09/21  Results were: no abnormalities /neg HPV DNA  Hx of STDs: {STD hx:14358}  There is no FH of breast cancer. There is no FH of ovarian cancer. The patient {does:18564} do self-breast exams.  Tobacco use: {tob:20664} Alcohol use: {Alcohol:11675} No drug use.  Exercise: {exercise:31265}  She {does:18564} get adequate calcium and Vitamin D in her diet.  Hx of anxiety, takes klonopin occas but ??? On paxil    Patient Active Problem List   Diagnosis Date Noted   Encounter for surveillance of contraceptive pills 12/09/2020   Anxiety 05/05/2020   Herpes simplex 11/26/2014   History of brain disorder 09/04/2014   History of seizure 09/04/2014    Past Surgical History:  Procedure Laterality Date   BREAST SURGERY      Family History  Problem Relation Age of Onset   Prostate cancer Father 31   Breast cancer Paternal Grandmother 72   Breast cancer Paternal Aunt 47   Pancreatic cancer Maternal Aunt 51    Social History   Socioeconomic History   Marital status: Single    Spouse name: Not on file   Number of children: Not on file   Years of education: Not on file   Highest education level: Not on file  Occupational History   Not on file  Tobacco Use   Smoking status: Never   Smokeless tobacco: Never  Substance and Sexual Activity   Alcohol use: Yes    Comment: occ   Drug use: No   Sexual activity: Yes    Birth control/protection: OCP    Comment: partner  had vasectomy  Other Topics Concern   Not on file  Social History Narrative   Not on file   Social Determinants of Health   Financial Resource Strain: Not on file  Food Insecurity: Not on file  Transportation Needs: Not on file  Physical Activity: Not on file  Stress: Not on file  Social Connections: Not on file  Intimate Partner Violence: Not on file     Current Outpatient Medications:    clindamycin (CLEOCIN) 300 MG capsule, Take 1 capsule (300 mg total) by mouth 2 (two) times daily. For seven days, Disp: 14 capsule, Rfl: 0   clonazePAM (KLONOPIN) 1 MG tablet, Take 1 tablet (1 mg total) by mouth 2 (two) times daily as needed. for anxiety, Disp: 30 tablet, Rfl: 0   conjugated estrogens-medroxyprogesteron (PREMPHASE) TABS tablet, Take 1 tablet by mouth daily., Disp: 30 tablet, Rfl: 5   Multiple Vitamin (MULTIVITAMIN WITH MINERALS) TABS tablet, Take 1 tablet by mouth daily., Disp: , Rfl:    PARoxetine (PAXIL) 20 MG tablet, Take 1/2 tab daily for 6 days, then 1 tab daily, Disp: 30 tablet, Rfl: 1   SUMAtriptan (IMITREX) 50 MG tablet, TAKE 1 TABLET BY MOUTH AT ONSET OF MIGRAINE. MAY REPEAT IN 2 HOURS IF HEADACHE PERSISTS OR RECURS., Disp: 10 tablet, Rfl: 0   valACYclovir (VALTREX) 1000 MG tablet, Take 1 tablet (1,000 mg total) by mouth  daily., Disp: 90 tablet, Rfl: 3     ROS:  Review of Systems BREAST: No symptoms   Objective: There were no vitals taken for this visit.   OBGyn Exam  Results: No results found for this or any previous visit (from the past 24 hour(s)).  Assessment/Plan: No diagnosis found.  No orders of the defined types were placed in this encounter.            GYN counsel {counseling: 16159}     F/U  No follow-ups on file.  Kalista Laguardia B. Chaka Jefferys, PA-C 06/20/2023 3:32 PM

## 2023-06-22 ENCOUNTER — Encounter: Payer: Self-pay | Admitting: Obstetrics and Gynecology

## 2023-06-22 ENCOUNTER — Ambulatory Visit (INDEPENDENT_AMBULATORY_CARE_PROVIDER_SITE_OTHER): Payer: Medicaid Other | Admitting: Obstetrics and Gynecology

## 2023-06-22 VITALS — BP 110/60 | Ht 67.0 in | Wt 146.0 lb

## 2023-06-22 DIAGNOSIS — Z803 Family history of malignant neoplasm of breast: Secondary | ICD-10-CM

## 2023-06-22 DIAGNOSIS — F419 Anxiety disorder, unspecified: Secondary | ICD-10-CM

## 2023-06-22 DIAGNOSIS — Z01419 Encounter for gynecological examination (general) (routine) without abnormal findings: Secondary | ICD-10-CM | POA: Diagnosis not present

## 2023-06-22 DIAGNOSIS — N951 Menopausal and female climacteric states: Secondary | ICD-10-CM

## 2023-06-22 DIAGNOSIS — A6004 Herpesviral vulvovaginitis: Secondary | ICD-10-CM

## 2023-06-22 DIAGNOSIS — Z1231 Encounter for screening mammogram for malignant neoplasm of breast: Secondary | ICD-10-CM

## 2023-06-22 DIAGNOSIS — Z9189 Other specified personal risk factors, not elsewhere classified: Secondary | ICD-10-CM

## 2023-06-22 DIAGNOSIS — F5105 Insomnia due to other mental disorder: Secondary | ICD-10-CM

## 2023-06-22 DIAGNOSIS — Z30011 Encounter for initial prescription of contraceptive pills: Secondary | ICD-10-CM

## 2023-06-22 MED ORDER — LEVONORGESTREL-ETHINYL ESTRAD 0.1-20 MG-MCG PO TABS
1.0000 | ORAL_TABLET | Freq: Every day | ORAL | 3 refills | Status: DC
Start: 2023-06-22 — End: 2023-08-14

## 2023-06-22 MED ORDER — SERTRALINE HCL 50 MG PO TABS
ORAL_TABLET | ORAL | 1 refills | Status: DC
Start: 2023-06-22 — End: 2023-08-14

## 2023-06-22 MED ORDER — TRAZODONE HCL 50 MG PO TABS
50.0000 mg | ORAL_TABLET | Freq: Every evening | ORAL | 1 refills | Status: DC | PRN
Start: 2023-06-22 — End: 2023-08-14

## 2023-06-22 MED ORDER — VALACYCLOVIR HCL 1 G PO TABS
1000.0000 mg | ORAL_TABLET | Freq: Every day | ORAL | 3 refills | Status: DC
Start: 2023-06-22 — End: 2024-06-04

## 2023-06-22 NOTE — Patient Instructions (Signed)
I value your feedback and you entrusting us with your care. If you get a [facility] patient survey, I would appreciate you taking the time to let us know about your experience today. Thank you!  [facility] ([place])--[phone]  

## 2023-08-08 NOTE — Progress Notes (Signed)
[facility], [place]   Chief Complaint  Patient presents with   Follow-up    Anxiety    HPI:      [name] is a 37 y.o. [id number] whose LMP was Patient's last menstrual period was 08/14/2023 (exact date)., presents today for anxiety/depression f/u. Started sertraline 50 mg 7/24 for sx; took it for 1 1/2 wks; didn't notice a difference in symptoms, no side effects. Wasn't able to sleep for 5 nights, not improved with trazodone. Restarted clonazepam for sleep so stopped sertraline. Hasn't been on sertraline since. Taking clonazepam prn sleep. Is exercising more, hasn't found in-network therapist yet. No SI. Pt went to ED for sharp chest pain yesterday; had elevated D-dimer with neg CT. Diagnosed with pleurisy. Had restarted OCPs 2 months ago. Pt states had elevated D-dimer about 2 yrs ago too, not on OCPs. Wants to start trying to conceive 1/25. Has had irregular bleeding on OCPs past 2 months; now with some vaginal irritation. Hx of BV in past. Wants to self-swab today.   06/22/23 NOTE:Hx of anxiety/depression/PTSD; has had a hard couple of yrs with loss of fiance; was on paxil and lexapro but doesn't take it long enough; prefers to do 1/2 to 1 tab klonopin daily as needed; usually takes 1/2 tab at bedtime to sleep. Has taken trazodone for sleep in past with sx relief. No success with unisom/melatonin.  Willing to try it again. Pt not exercising much now and plans to increase, has helped before. Does get episodic panic attacks. Has seen therapist. Discussed importance of treating anxiety with SSRI and not temporary sx relief with benzo, as well as addictive potential and new studies showing brain alterations with regular use.  Pt willing to try sertraline since safe with pregnancy in future.  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/04/2014   History of seizure 09/04/2014    Past Surgical History:  Procedure Laterality Date   BREAST SURGERY      Family History  Problem Relation Age of Onset   Prostate cancer Father 39   Breast cancer Paternal Grandmother 107   Pancreatic cancer Maternal Aunt 49   Breast cancer Paternal Aunt 66    Social History   Socioeconomic History   Marital status: Single    Spouse name: Not on file   Number of children: Not on file   Years of education: Not on file   Highest education level: Not on file  Occupational History   Not on file  Tobacco Use   Smoking status: Never   Smokeless tobacco: Never  Vaping Use   Vaping status: Never Used  Substance and Sexual Activity   Alcohol use: Yes    Comment: occ   Drug use: No   Sexual activity: Yes    Birth control/protection: None    Comment: partner had vasectomy  Other Topics Concern   Not on file  Social History Narrative   Not on file   Social Determinants of Health   Financial Resource Strain: Not on file  Food Insecurity: Not on file  Transportation Needs: Not on file  Physical Activity: Not on file  Stress: Not on file  Social Connections: Not on file  Intimate Partner Violence: Not on file  Outpatient Medications Prior to Visit  Medication Sig Dispense Refill   clonazePAM (KLONOPIN) 1 MG tablet Take 1 tablet (1 mg total) by mouth 2 (two) times daily as needed. for anxiety 15 tablet 0   Multiple Vitamin (MULTIVITAMIN WITH MINERALS) TABS tablet Take 1 tablet by mouth daily.     SUMAtriptan (IMITREX) 50 MG tablet TAKE 1 TABLET BY MOUTH AT ONSET OF MIGRAINE. MAY REPEAT IN 2 HOURS IF HEADACHE PERSISTS OR RECURS. 10 tablet 0   valACYclovir (VALTREX) 1000 MG tablet Take 1 tablet (1,000 mg total) by mouth daily. 90 tablet 3   levonorgestrel-ethinyl estradiol (AVIANE) 0.1-20 MG-MCG tablet Take 1 tablet by mouth daily. 84 tablet 3    sertraline (ZOLOFT) 50 MG tablet Take 1/2 tab daily for 6 days, then 1 tab daily 30 tablet 1   traZODone (DESYREL) 50 MG tablet Take 1 tablet (50 mg total) by mouth at bedtime as needed for sleep. 30 tablet 1   conjugated estrogens-medroxyprogesteron (PREMPHASE) TABS tablet Take 1 tablet by mouth daily. (Patient not taking: Reported on 06/22/2023) 30 tablet 5   No facility-administered medications prior to visit.      ROS:  Review of Systems  Constitutional:  Negative for fever.  Gastrointestinal:  Negative for blood in stool, constipation, diarrhea, nausea and vomiting.  Genitourinary:  Negative for dyspareunia, dysuria, flank pain, frequency, hematuria, urgency, vaginal bleeding, vaginal discharge and vaginal pain.  Musculoskeletal:  Negative for back pain.  Skin:  Negative for rash.   BREAST: No symptoms   OBJECTIVE:   Vitals:  BP 104/80   Ht 5\' 7"  (1.702 m)   Wt 146 lb (66.2 kg)   LMP 08/14/2023 (Exact Date)   BMI 22.87 kg/m   Physical Exam Vitals reviewed.  Constitutional:      Appearance: She is well-developed.  Pulmonary:     Effort: Pulmonary effort is normal.  Musculoskeletal:        General: Normal range of motion.     Cervical back: Normal range of motion.  Skin:    General: Skin is warm and dry.  Neurological:     General: No focal deficit present.     Mental Status: She is alert and oriented to person, place, and time.     Cranial Nerves: No cranial nerve deficit.  Psychiatric:        Mood and Affect: Mood normal.        Behavior: Behavior normal.        Thought Content: Thought content normal.        Judgment: Judgment normal.     Results:    08/14/2023    9:45 AM 06/22/2023    9:57 AM 02/21/2023    3:32 PM  GAD 7 : Generalized Anxiety Score  Nervous, Anxious, on Edge 2 3 3   Control/stop worrying 2 3 3   Worry too much - different things 2 3 3   Trouble relaxing 2 3 3   Restless 2 3 3   Easily annoyed or irritable 2 3 3   Afraid - awful might  happen 2 3 3   Total GAD 7 Score 14 21 21   Anxiety Difficulty  Somewhat difficult Very difficult       08/14/2023    9:45 AM  Depression screen PHQ 2/9  Decreased Interest 2  Down, Depressed, Hopeless 1  PHQ - 2 Score 3  Altered sleeping 3  Tired, decreased energy 2  Change in appetite 1  Feeling bad or failure about yourself  2  Trouble concentrating  1  Moving slowly or fidgety/restless 0  Suicidal thoughts 0  PHQ-9 Score 12     Assessment/Plan: Anxiety and depression - Plan: sertraline (ZOLOFT) 50 MG tablet; restart sertraline, Rx eRxd. Pt to f/u via phone/MyChart in 7 wks. Aware it takes 6-8 wks to reach max effectiveness. Once sx improved, will be able to decrease clonazepam use (Rx RF eRxd last wk). Cont exercise, encouraged therapy.   Vaginal irritation - Plan: Cervicovaginal ancillary only; self-swab today, will f/u with results if pos.   Encounter for surveillance of contraceptive pills - Plan: norethindrone (MICRONOR) 0.35 MG tablet; Rx change to POPs given recent elevated D-dimer. Rx eRxd, aware to use condoms if more than 3 hrs late taking pill. Plans to try to conceive 1/25; start PNVs. F/u prn.    Meds ordered this encounter  Medications   sertraline (ZOLOFT) 50 MG tablet    Sig: Take 1/2 tab daily for 6 days, then 1 tab daily    Dispense:  30 tablet    Refill:  1    Order Specific Question:   Supervising Provider    Answer:   Hildred Laser [[id number]]   norethindrone (MICRONOR) 0.35 MG tablet    Sig: Take 1 tablet (0.35 mg total) by mouth daily.    Dispense:  84 tablet    Refill:  1    Order Specific Question:   Supervising Provider    Answer:   Hildred Laser [[id number]]      Return if symptoms worsen or fail to improve.  Daemyn Gariepy B. Elesha Thedford, PA-C 08/14/2023 9:58 AM

## 2023-08-09 ENCOUNTER — Other Ambulatory Visit: Payer: Self-pay | Admitting: Obstetrics and Gynecology

## 2023-08-09 ENCOUNTER — Other Ambulatory Visit: Payer: Self-pay | Admitting: Neurology

## 2023-08-09 DIAGNOSIS — F419 Anxiety disorder, unspecified: Secondary | ICD-10-CM

## 2023-08-09 MED ORDER — SUMATRIPTAN SUCCINATE 50 MG PO TABS: ORAL_TABLET | ORAL | 0 refills | Status: DC

## 2023-08-10 NOTE — Telephone Encounter (Signed)
[facility] calling triage saying pt told them [facility] was going to send RF yesterday but they don't have anything. Advised RF request was sent to [facility] and waiting approval from [facility].

## 2023-08-11 MED ORDER — CLONAZEPAM 1 MG PO TABS: 1.0000 mg | ORAL_TABLET | Freq: Two times a day (BID) | ORAL | 0 refills | Status: DC | PRN

## 2023-08-13 ENCOUNTER — Other Ambulatory Visit: Payer: Self-pay

## 2023-08-13 ENCOUNTER — Emergency Department
Admission: EM | Admit: 2023-08-13 | Discharge: 2023-08-13 | Disposition: A | Discharge status: Home / Self Care | Payer: Medicaid Other | Attending: Emergency Medicine | Admitting: Emergency Medicine

## 2023-08-13 ENCOUNTER — Emergency Department: Payer: Medicaid Other

## 2023-08-13 DIAGNOSIS — R091 Pleurisy: Secondary | ICD-10-CM | POA: Insufficient documentation

## 2023-08-13 DIAGNOSIS — R079 Chest pain, unspecified: Secondary | ICD-10-CM | POA: Diagnosis present

## 2023-08-13 LAB — CBC
HCT: 37 % (ref 36.0–46.0)
Hemoglobin: 12.5 g/dL (ref 12.0–15.0)
MCH: 32.1 pg (ref 26.0–34.0)
MCHC: 33.8 g/dL (ref 30.0–36.0)
MCV: 95.1 fL (ref 80.0–100.0)
Platelets: 249 10*3/uL (ref 150–400)
RBC: 3.89 MIL/uL (ref 3.87–5.11)
RDW: 13 % (ref 11.5–15.5)
WBC: 4.5 10*3/uL (ref 4.0–10.5)
nRBC: 0 % (ref 0.0–0.2)

## 2023-08-13 LAB — BASIC METABOLIC PANEL
Anion gap: 11 (ref 5–15)
BUN: 12 mg/dL (ref 6–20)
CO2: 25 mmol/L (ref 22–32)
Calcium: 8.2 mg/dL — ABNORMAL LOW (ref 8.9–10.3)
Chloride: 102 mmol/L (ref 98–111)
Creatinine, Ser: 0.56 mg/dL (ref 0.44–1.00)
GFR, Estimated: >60 mL/min (ref >60)
Glucose, Bld: 83 mg/dL (ref 70–99)
Potassium: 3.7 mmol/L (ref 3.5–5.1)
Sodium: 138 mmol/L (ref 135–145)

## 2023-08-13 LAB — POC URINE PREG, ED: Preg Test, Ur: NEGATIVE

## 2023-08-13 LAB — D-DIMER, QUANTITATIVE: D-Dimer, Quant: 0.95 ug{FEU}/mL — ABNORMAL HIGH (ref 0.00–0.50)

## 2023-08-13 LAB — TROPONIN I (HIGH SENSITIVITY)
Troponin I (High Sensitivity): 3 ng/L (ref <18)
Troponin I (High Sensitivity): 3 ng/L (ref <18)

## 2023-08-13 LAB — HCG, QUANTITATIVE, PREGNANCY: hCG, Beta Chain, Quant, S: <1 m[IU]/mL (ref <5)

## 2023-08-13 MED ORDER — MORPHINE SULFATE (PF) 4 MG/ML IV SOLN
4.0000 mg | Freq: Once | INTRAVENOUS | Status: AC
  Administered 2023-08-13: 4 mg via INTRAVENOUS
  Filled 2023-08-13: qty 1

## 2023-08-13 MED ORDER — KETOROLAC TROMETHAMINE 30 MG/ML IJ SOLN: 30.0000 mg | Freq: Once | INTRAMUSCULAR | Status: DC

## 2023-08-13 MED ORDER — IOHEXOL 350 MG/ML SOLN
75.0000 mL | Freq: Once | INTRAVENOUS | Status: AC | PRN
  Administered 2023-08-13: 75 mL via INTRAVENOUS

## 2023-08-13 MED ORDER — KETOROLAC TROMETHAMINE 15 MG/ML IJ SOLN
15.0000 mg | Freq: Once | INTRAMUSCULAR | Status: AC
  Administered 2023-08-13: 15 mg via INTRAVENOUS
  Filled 2023-08-13: qty 1

## 2023-08-13 MED ORDER — ONDANSETRON HCL 4 MG/2ML IJ SOLN
4.0000 mg | Freq: Once | INTRAMUSCULAR | Status: AC
  Administered 2023-08-13: 4 mg via INTRAVENOUS
  Filled 2023-08-13: qty 2

## 2023-08-13 MED ORDER — ACETAMINOPHEN 500 MG PO TABS
1000.0000 mg | ORAL_TABLET | Freq: Once | ORAL | Status: AC
  Administered 2023-08-13: 1000 mg via ORAL
  Filled 2023-08-13: qty 2

## 2023-08-13 NOTE — Discharge Instructions (Addendum)
You were seen in the in the emergency department for chest pain.  You are not having a heart attack today.  You had no findings of a blood clot.  You had no signs of pneumonia.  Your lab work and your vital signs were normal.  Concern that you have pleurisy.  You can alternate Motrin and Tylenol for pain control.  Return turn to the emergency department for any worsening symptoms.  Pain control:  Ibuprofen (motrin/aleve/advil) - You can take 3 tablets (600 mg) every 6 hours as needed for pain/fever.  Acetaminophen (tylenol) - You can take 2 extra strength tablets (1000 mg) every 6 hours as needed for pain/fever.  You can alternate these medications or take them together.  Make sure you eat food/drink water when taking these medications.  Thank you for choosing [facility] for your health care, it was my pleasure to care for you today!  Corena Herter, MD

## 2023-08-13 NOTE — ED Provider Notes (Signed)
[facility] Provider Note    Event Date/Time   First MD Initiated Contact with Patient 08/13/23 1701     (approximate)   History   Chest Pain   HPI  Latoya Luna is a 37 y.o. female no significant past medical history who presents to the emergency department chest pain.  Woke up this morning with severe chest pain, describes a sharp stabbing pain to the middle of her chest and on the left side.  Worse with deep inspiration.  Denies any cough.  Mild shortness of breath.  Denies any nausea vomiting or diaphoresis.  Recently started on OCP.  No recent travel.  No swelling into her leg.  No family history coronary artery disease at a young age.     Physical Exam   Triage Vital Signs: ED Triage Vitals  Encounter Vitals Group     BP 08/13/23 1551 109/78     Systolic BP Percentile --      Diastolic BP Percentile --      Pulse Rate 08/13/23 1551 100     Resp 08/13/23 1551 19     Temp 08/13/23 1551 98.7 F (37.1 C)     Temp Source 08/13/23 1551 Oral     SpO2 08/13/23 1551 98 %     Weight 08/13/23 1550 140 lb (63.5 kg)     Height 08/13/23 1550 5\' 7"  (1.702 m)     Head Circumference --      Peak Flow --      Pain Score 08/13/23 1550 7     Pain Loc --      Pain Education --      Exclude from Growth Chart --     Most recent vital signs: Vitals:   08/13/23 1551  BP: 109/78  Pulse: 100  Resp: 19  Temp: 98.7 F (37.1 C)  SpO2: 98%    Physical Exam Constitutional:      Appearance: She is well-developed.  HENT:     Head: Atraumatic.  Eyes:     Conjunctiva/sclera: Conjunctivae normal.  Cardiovascular:     Rate and Rhythm: Regular rhythm.     Heart sounds: Normal heart sounds.  Pulmonary:     Effort: No respiratory distress.  Abdominal:     General: There is no distension.     Palpations: Abdomen is soft.  Musculoskeletal:        General: Normal range of motion.     Cervical back: Normal range of motion.     Right lower leg: No edema.      Left lower leg: No edema.  Skin:    General: Skin is warm.  Neurological:     Mental Status: She is alert. Mental status is at baseline.     IMPRESSION / MDM / ASSESSMENT AND PLAN / ED COURSE  I reviewed the triage vital signs and the nursing notes.  Differential diagnosis including ACS, pneumonia, pulmonary embolism, pleurisy, costochondritis  Low risk Wells criteria will obtain a D-dimer  D-dimer positive will obtain CTA  EKG  I, Corena Herter, the attending physician, personally viewed and interpreted this ECG.   Rate: Normal  Rhythm: Normal sinus  Axis: Normal  Intervals: Normal  ST&T Change: None  No tachycardic or bradycardic dysrhythmias while on cardiac telemetry.  RADIOLOGY I independently reviewed imaging, my interpretation of imaging: CTA with no signs of pulmonary embolism  LABS (all labs ordered are listed, but only abnormal results are displayed) Labs interpreted as -  Labs Reviewed  BASIC METABOLIC PANEL - Abnormal; Notable for the following components:      Result Value   Calcium 8.2 (*)    All other components within normal limits  D-DIMER, QUANTITATIVE - Abnormal; Notable for the following components:   D-Dimer, Quant 0.95 (*)    All other components within normal limits  CBC  HCG, QUANTITATIVE, PREGNANCY  POC URINE PREG, ED  TROPONIN I (HIGH SENSITIVITY)  TROPONIN I (HIGH SENSITIVITY)     MDM    EKG with no acute findings.  Troponin negative.  Low risk Wells criteria however D-dimer was positive.  CTA with no signs of pulmonary embolism or pneumonia.  Lab work overall reassuring.  Most likely with pleurisy.  Given multiple doses of pain medication in the emergency department.  Patient now has pain that has been well-controlled.  No further findings consistent with acute pericarditis.  Discussed close follow-up with primary care physician and given return precautions.   PROCEDURES:  Critical Care performed:  No  Procedures  Patient's presentation is most consistent with acute presentation with potential threat to life or bodily function.   MEDICATIONS ORDERED IN ED: Medications  acetaminophen (TYLENOL) tablet 1,000 mg (1,000 mg Oral Given 08/13/23 1750)  ketorolac (TORADOL) 15 MG/ML injection 15 mg (15 mg Intravenous Given 08/13/23 1751)  morphine (PF) 4 MG/ML injection 4 mg (4 mg Intravenous Given 08/13/23 1820)  ondansetron (ZOFRAN) injection 4 mg (4 mg Intravenous Given 08/13/23 1821)  iohexol (OMNIPAQUE) 350 MG/ML injection 75 mL (75 mLs Intravenous Contrast Given 08/13/23 1827)    FINAL CLINICAL IMPRESSION(S) / ED DIAGNOSES   Final diagnoses:  Pleurisy     Rx / DC Orders   ED Discharge Orders     None        Note:  This document was prepared using Dragon voice recognition software and may include unintentional dictation errors.   Corena Herter, MD 08/13/23 2317

## 2023-08-13 NOTE — ED Triage Notes (Signed)
Pt sts that she has been having chest pain since this AM when she woke up.

## 2023-08-14 ENCOUNTER — Ambulatory Visit (INDEPENDENT_AMBULATORY_CARE_PROVIDER_SITE_OTHER): Payer: Medicaid Other | Admitting: Obstetrics and Gynecology

## 2023-08-14 ENCOUNTER — Encounter: Payer: Self-pay | Admitting: Obstetrics and Gynecology

## 2023-08-14 ENCOUNTER — Other Ambulatory Visit (HOSPITAL_COMMUNITY)
Admission: RE | Admit: 2023-08-14 | Discharge: 2023-08-14 | Disposition: A | Discharge status: Home / Self Care | Payer: Medicaid Other | Source: Ambulatory Visit | Attending: Obstetrics and Gynecology | Admitting: Obstetrics and Gynecology

## 2023-08-14 VITALS — BP 104/80 | Ht 67.0 in | Wt 146.0 lb

## 2023-08-14 DIAGNOSIS — N898 Other specified noninflammatory disorders of vagina: Secondary | ICD-10-CM | POA: Diagnosis present

## 2023-08-14 DIAGNOSIS — F32A Depression, unspecified: Secondary | ICD-10-CM | POA: Diagnosis not present

## 2023-08-14 DIAGNOSIS — Z3041 Encounter for surveillance of contraceptive pills: Secondary | ICD-10-CM | POA: Diagnosis not present

## 2023-08-14 DIAGNOSIS — F419 Anxiety disorder, unspecified: Secondary | ICD-10-CM

## 2023-08-14 MED ORDER — SERTRALINE HCL 50 MG PO TABS
ORAL_TABLET | ORAL | 1 refills | Status: DC
Start: 2023-08-14 — End: 2023-10-12

## 2023-08-14 MED ORDER — NORETHINDRONE 0.35 MG PO TABS
1.0000 | ORAL_TABLET | Freq: Every day | ORAL | 1 refills | Status: DC
Start: 2023-08-14 — End: 2024-01-19

## 2023-08-14 NOTE — Patient Instructions (Signed)
I value your feedback and you entrusting us with your care. If you get a [facility] patient survey, I would appreciate you taking the time to let us know about your experience today. Thank you! ? ? ?

## 2023-08-15 LAB — CERVICOVAGINAL ANCILLARY ONLY
Bacterial Vaginitis (gardnerella): NEGATIVE
Candida Glabrata: NEGATIVE
Candida Vaginitis: POSITIVE — AB
Comment: NEGATIVE
Comment: NEGATIVE
Comment: NEGATIVE

## 2023-08-15 MED ORDER — FLUCONAZOLE 150 MG PO TABS
150.0000 mg | ORAL_TABLET | Freq: Once | ORAL | 0 refills | Status: AC
Start: 2023-08-15 — End: 2023-08-15

## 2023-08-15 NOTE — Addendum Note (Signed)
Addended by: Althea Grimmer B on: 08/15/2023 12:06 PM   Modules accepted: Orders

## 2023-09-11 ENCOUNTER — Other Ambulatory Visit: Payer: Self-pay | Admitting: Obstetrics and Gynecology

## 2023-09-11 DIAGNOSIS — F419 Anxiety disorder, unspecified: Secondary | ICD-10-CM

## 2023-09-12 ENCOUNTER — Other Ambulatory Visit: Payer: Self-pay | Admitting: Obstetrics and Gynecology

## 2023-09-12 DIAGNOSIS — F419 Anxiety disorder, unspecified: Secondary | ICD-10-CM

## 2023-09-12 MED ORDER — CLONAZEPAM 1 MG PO TABS
1.0000 mg | ORAL_TABLET | Freq: Two times a day (BID) | ORAL | 0 refills | Status: DC | PRN
Start: 2023-09-12 — End: 2023-10-12

## 2023-09-12 NOTE — Progress Notes (Signed)
Rx RF clonazepam while sertraline dose being adjusted.

## 2023-10-11 ENCOUNTER — Other Ambulatory Visit (HOSPITAL_COMMUNITY)
Admission: RE | Admit: 2023-10-11 | Discharge: 2023-10-11 | Disposition: A | Discharge status: Home / Self Care | Payer: Medicaid Other | Source: Ambulatory Visit | Attending: Licensed Practical Nurse | Admitting: Licensed Practical Nurse

## 2023-10-11 ENCOUNTER — Other Ambulatory Visit: Payer: Self-pay | Admitting: Obstetrics and Gynecology

## 2023-10-11 ENCOUNTER — Ambulatory Visit: Payer: Medicaid Other

## 2023-10-11 VITALS — BP 134/84 | HR 82 | Ht 67.0 in | Wt 146.0 lb

## 2023-10-11 DIAGNOSIS — N898 Other specified noninflammatory disorders of vagina: Secondary | ICD-10-CM | POA: Diagnosis present

## 2023-10-11 DIAGNOSIS — F419 Anxiety disorder, unspecified: Secondary | ICD-10-CM

## 2023-10-11 LAB — POCT URINALYSIS DIPSTICK OB
Bilirubin, UA: NEGATIVE
Blood, UA: NEGATIVE
Glucose, UA: NEGATIVE
Ketones, UA: NEGATIVE
Leukocytes, UA: NEGATIVE
Nitrite, UA: NEGATIVE
POC,PROTEIN,UA: NEGATIVE
Spec Grav, UA: 1.02 (ref 1.010–1.025)
Urobilinogen, UA: 0.2 U/dL
pH, UA: 6 (ref 5.0–8.0)

## 2023-10-11 NOTE — Progress Notes (Signed)
    NURSE VISIT NOTE  Subjective:    Patient ID: Latoya Luna, female    DOB: 09/24/1986, 37 y.o.   MRN: [id number]  HPI  Patient is a 37 y.o. [id number] female who presents for white vaginal discharge for a couple days Denies abnormal vaginal bleeding or significant pelvic pain or fever. admits to  vaginal irritation  . Patient denies history of known exposure to STD.Pt asking for a urine check as well.Patient verified her label for ancillary swab and urine that she self collected in office.     Objective:    BP 134/84   Pulse 82   Ht 5\' 7"  (1.702 m)   Wt 146 lb (66.2 kg)   BMI 22.87 kg/m     Assessment:   1. Vaginal discharge   2. Vaginal irritation       Plan:   GC and chlamydia DNA  probe sent to lab. Treatment: abstain from coitus during course of treatment ROV prn if symptoms persist or worsen.   Loney Laurence, CMA

## 2023-10-11 NOTE — Addendum Note (Signed)
Addended by: Loney Laurence on: 10/11/2023 10:51 AM   Modules accepted: Orders

## 2023-10-12 ENCOUNTER — Other Ambulatory Visit: Payer: Self-pay | Admitting: Obstetrics and Gynecology

## 2023-10-12 DIAGNOSIS — F419 Anxiety disorder, unspecified: Secondary | ICD-10-CM

## 2023-10-12 DIAGNOSIS — F32A Depression, unspecified: Secondary | ICD-10-CM

## 2023-10-12 LAB — CERVICOVAGINAL ANCILLARY ONLY
Bacterial Vaginitis (gardnerella): NEGATIVE
Candida Glabrata: NEGATIVE
Candida Vaginitis: POSITIVE — AB
Chlamydia: NEGATIVE
Comment: NEGATIVE
Comment: NEGATIVE
Comment: NEGATIVE
Comment: NEGATIVE
Comment: NEGATIVE
Comment: NORMAL
Neisseria Gonorrhea: NEGATIVE
Trichomonas: NEGATIVE

## 2023-10-12 MED ORDER — SERTRALINE HCL 100 MG PO TABS
100.0000 mg | ORAL_TABLET | Freq: Every day | ORAL | 0 refills | Status: DC
Start: 2023-10-12 — End: 2023-10-30

## 2023-10-12 MED ORDER — CLONAZEPAM 1 MG PO TABS
1.0000 mg | ORAL_TABLET | Freq: Two times a day (BID) | ORAL | 0 refills | Status: DC | PRN
Start: 2023-10-12 — End: 2023-10-30

## 2023-10-12 NOTE — Progress Notes (Signed)
Rx RF clonazepam. Pt needs to increase sertraline to 100 mg dose due to cont panic attacks. Likes clonazepam for sleep as well.

## 2023-10-13 ENCOUNTER — Encounter: Payer: Self-pay | Admitting: Obstetrics and Gynecology

## 2023-10-14 LAB — URINE CULTURE: Organism ID, Bacteria: NO GROWTH

## 2023-10-16 ENCOUNTER — Other Ambulatory Visit: Payer: Self-pay

## 2023-10-16 DIAGNOSIS — B3731 Acute candidiasis of vulva and vagina: Secondary | ICD-10-CM

## 2023-10-16 DIAGNOSIS — B379 Candidiasis, unspecified: Secondary | ICD-10-CM

## 2023-10-16 MED ORDER — FLUCONAZOLE 150 MG PO TABS
150.0000 mg | ORAL_TABLET | Freq: Every day | ORAL | 0 refills | Status: DC
Start: 2023-10-16 — End: 2024-03-25

## 2023-10-30 ENCOUNTER — Other Ambulatory Visit: Payer: Self-pay | Admitting: Neurology

## 2023-10-30 ENCOUNTER — Other Ambulatory Visit: Payer: Self-pay | Admitting: Obstetrics and Gynecology

## 2023-10-30 DIAGNOSIS — F32A Depression, unspecified: Secondary | ICD-10-CM

## 2023-10-31 MED ORDER — CLONAZEPAM 1 MG PO TABS
1.0000 mg | ORAL_TABLET | Freq: Two times a day (BID) | ORAL | 0 refills | Status: DC | PRN
Start: 2023-10-31 — End: 2023-12-19

## 2023-10-31 MED ORDER — SERTRALINE HCL 100 MG PO TABS
100.0000 mg | ORAL_TABLET | Freq: Every day | ORAL | 2 refills | Status: DC
Start: 2023-10-31 — End: 2024-08-01

## 2023-10-31 NOTE — Telephone Encounter (Signed)
Pls call pt to do GAD/PHQ. Confirm she is on 100 mg dose (Rxd 10/12/23). Thx

## 2023-10-31 NOTE — Telephone Encounter (Signed)
GAD=8; PHQ=7. Pt likes clonazepam to sleep. Won't try other OTC meds, no relief with trazodone.

## 2023-11-21 MED ORDER — SUMATRIPTAN SUCCINATE 50 MG PO TABS: ORAL_TABLET | ORAL | 0 refills | Status: DC

## 2023-12-19 ENCOUNTER — Other Ambulatory Visit: Payer: Self-pay | Admitting: Obstetrics and Gynecology

## 2023-12-19 DIAGNOSIS — F32A Depression, unspecified: Secondary | ICD-10-CM

## 2023-12-20 ENCOUNTER — Other Ambulatory Visit: Payer: Self-pay | Admitting: Obstetrics and Gynecology

## 2023-12-20 DIAGNOSIS — F32A Depression, unspecified: Secondary | ICD-10-CM

## 2023-12-21 ENCOUNTER — Encounter: Payer: Self-pay | Admitting: Obstetrics and Gynecology

## 2023-12-21 NOTE — Telephone Encounter (Signed)
Pls call pt to f/u on 150 mg sertaline dose and do GAD/PHQ. Thx.

## 2023-12-21 NOTE — Telephone Encounter (Signed)
Has been take care of in different encounter.

## 2023-12-21 NOTE — Telephone Encounter (Signed)
Pls let pt know I'm out of town and can't refill remotely. Will send in Sun once I'm home.

## 2023-12-22 NOTE — Telephone Encounter (Signed)
Rx sertraline 100 mg dose #90 with 2 RF eRxd 11/27/23. Pt can pick up that Rx and start 1 1/2 tabs (150 mg) daily and f/u with how that is working via [employer] in 1 month. Thx.

## 2023-12-24 MED ORDER — CLONAZEPAM 1 MG PO TABS
1.0000 mg | ORAL_TABLET | Freq: Two times a day (BID) | ORAL | 0 refills | Status: DC | PRN
Start: 2023-12-24 — End: 2024-02-09

## 2023-12-25 NOTE — Telephone Encounter (Signed)
Pt aware.

## 2023-12-25 NOTE — Telephone Encounter (Signed)
Called pt, no answer, LVMTRC.

## 2024-01-18 ENCOUNTER — Other Ambulatory Visit: Payer: Self-pay | Admitting: Obstetrics and Gynecology

## 2024-01-18 DIAGNOSIS — Z3041 Encounter for surveillance of contraceptive pills: Secondary | ICD-10-CM

## 2024-02-09 ENCOUNTER — Telehealth: Payer: Self-pay

## 2024-02-09 ENCOUNTER — Encounter: Payer: Self-pay | Admitting: Advanced Practice Midwife

## 2024-02-09 ENCOUNTER — Other Ambulatory Visit: Payer: Self-pay | Admitting: Advanced Practice Midwife

## 2024-02-09 DIAGNOSIS — F32A Depression, unspecified: Secondary | ICD-10-CM

## 2024-02-09 MED ORDER — CLONAZEPAM 1 MG PO TABS
1.0000 mg | ORAL_TABLET | Freq: Two times a day (BID) | ORAL | 0 refills | Status: DC | PRN
Start: 2024-02-09 — End: 2024-03-07

## 2024-02-09 NOTE — Progress Notes (Signed)
 Rx Klonopin refill (10 tabs) sent per patient request for panic attack. Message to patient.

## 2024-02-09 NOTE — Telephone Encounter (Signed)
 Patient requesting refill of clonazePAM (KLONOPIN) 1 MG tablet . Advised Helmut Muster is out of the office until Monday. Patient reports she uses it for her panic attacks. She's been sick with stomach virus all week. She had a panic attack about an hour ago and realized she was out of meds. Advised will send to on call provider for review.

## 2024-02-12 ENCOUNTER — Encounter: Payer: Self-pay | Admitting: Advanced Practice Midwife

## 2024-02-12 ENCOUNTER — Other Ambulatory Visit: Payer: Self-pay | Admitting: Advanced Practice Midwife

## 2024-02-12 ENCOUNTER — Telehealth: Payer: Self-pay

## 2024-02-12 DIAGNOSIS — R110 Nausea: Secondary | ICD-10-CM

## 2024-02-12 MED ORDER — ONDANSETRON 4 MG PO TBDP
4.0000 mg | ORAL_TABLET | Freq: Three times a day (TID) | ORAL | 0 refills | Status: AC | PRN
Start: 2024-02-12 — End: ?

## 2024-02-12 NOTE — Progress Notes (Signed)
 Message to patient regarding zofran rx and encouraged to increase hydration.

## 2024-02-12 NOTE — Telephone Encounter (Signed)
 Patient called she was evaluated by EMS yesterday for dehydration. She was treated with Zofran. She would like for you to send her more Zofran. She felt better when she was on it. Please advise.

## 2024-03-05 ENCOUNTER — Other Ambulatory Visit: Payer: Self-pay | Admitting: Advanced Practice Midwife

## 2024-03-05 DIAGNOSIS — F32A Anxiety disorder, unspecified: Secondary | ICD-10-CM

## 2024-03-06 ENCOUNTER — Encounter: Payer: Self-pay | Admitting: Obstetrics and Gynecology

## 2024-03-07 ENCOUNTER — Other Ambulatory Visit: Payer: Self-pay | Admitting: Obstetrics and Gynecology

## 2024-03-07 DIAGNOSIS — F419 Anxiety disorder, unspecified: Secondary | ICD-10-CM

## 2024-03-07 MED ORDER — CLONAZEPAM 1 MG PO TABS
1.0000 mg | ORAL_TABLET | Freq: Two times a day (BID) | ORAL | 0 refills | Status: DC | PRN
Start: 2024-03-07 — End: 2024-04-16

## 2024-03-07 NOTE — Progress Notes (Signed)
 Rx RF klonopin. Pt tries to take sparingly for sleep

## 2024-03-21 ENCOUNTER — Other Ambulatory Visit (HOSPITAL_COMMUNITY)
Admission: RE | Admit: 2024-03-21 | Discharge: 2024-03-21 | Disposition: A | Discharge status: Home / Self Care | Source: Ambulatory Visit | Attending: Licensed Practical Nurse | Admitting: Licensed Practical Nurse

## 2024-03-21 ENCOUNTER — Ambulatory Visit (INDEPENDENT_AMBULATORY_CARE_PROVIDER_SITE_OTHER)

## 2024-03-21 VITALS — BP 110/79 | HR 90 | Wt 150.9 lb

## 2024-03-21 DIAGNOSIS — N898 Other specified noninflammatory disorders of vagina: Secondary | ICD-10-CM | POA: Diagnosis present

## 2024-03-21 NOTE — Progress Notes (Signed)
    NURSE VISIT NOTE  Subjective:    Patient ID: LENAE WHERLEY, female    DOB: 1986-01-22, 38 y.o.   MRN: [id number]  HPI  Patient is a 38 y.o. G74P3003 female who presents for white vaginal discharge for 1 week(s). Denies abnormal vaginal bleeding or significant pelvic pain or fever. denies. Patient denies history of known exposure to STD.   Objective:    BP 110/79 (BP Location: Right Arm, Patient Position: Sitting, Cuff Size: Normal)   Pulse 90   Wt 150 lb 14.4 oz (68.4 kg)   BMI 23.63 kg/m     Assessment:     Plan:   GC and chlamydia DNA  probe sent to lab. Treatment: waiting on swab results ROV prn if symptoms persist or worsen.   Cathyann Cobia, CMA

## 2024-03-22 ENCOUNTER — Other Ambulatory Visit: Payer: Self-pay

## 2024-03-22 ENCOUNTER — Emergency Department
Admission: EM | Admit: 2024-03-22 | Discharge: 2024-03-22 | Disposition: A | Discharge status: Home / Self Care | Attending: Emergency Medicine | Admitting: Emergency Medicine

## 2024-03-22 DIAGNOSIS — F419 Anxiety disorder, unspecified: Secondary | ICD-10-CM | POA: Diagnosis not present

## 2024-03-22 DIAGNOSIS — F411 Generalized anxiety disorder: Secondary | ICD-10-CM

## 2024-03-22 DIAGNOSIS — F439 Reaction to severe stress, unspecified: Secondary | ICD-10-CM | POA: Diagnosis present

## 2024-03-22 LAB — HCG, QUANTITATIVE, PREGNANCY: hCG, Beta Chain, Quant, S: <1 m[IU]/mL (ref <5)

## 2024-03-22 LAB — BASIC METABOLIC PANEL WITH GFR
Anion gap: 12 (ref 5–15)
BUN: 15 mg/dL (ref 6–20)
CO2: 19 mmol/L — ABNORMAL LOW (ref 22–32)
Calcium: 8.4 mg/dL — ABNORMAL LOW (ref 8.9–10.3)
Chloride: 108 mmol/L (ref 98–111)
Creatinine, Ser: 0.61 mg/dL (ref 0.44–1.00)
GFR, Estimated: >60 mL/min (ref >60)
Glucose, Bld: 94 mg/dL (ref 70–99)
Potassium: 3.4 mmol/L — ABNORMAL LOW (ref 3.5–5.1)
Sodium: 139 mmol/L (ref 135–145)

## 2024-03-22 LAB — CBC
HCT: 33.5 % — ABNORMAL LOW (ref 36.0–46.0)
Hemoglobin: 11.4 g/dL — ABNORMAL LOW (ref 12.0–15.0)
MCH: 32.3 pg (ref 26.0–34.0)
MCHC: 34 g/dL (ref 30.0–36.0)
MCV: 94.9 fL (ref 80.0–100.0)
Platelets: 215 10*3/uL (ref 150–400)
RBC: 3.53 MIL/uL — ABNORMAL LOW (ref 3.87–5.11)
RDW: 13.1 % (ref 11.5–15.5)
WBC: 4.2 10*3/uL (ref 4.0–10.5)
nRBC: 0 % (ref 0.0–0.2)

## 2024-03-22 LAB — TROPONIN I (HIGH SENSITIVITY): Troponin I (High Sensitivity): <2 ng/L (ref <18)

## 2024-03-22 NOTE — ED Triage Notes (Signed)
 Pt reports having stress at home and has stress induced seizures.  2 seizures reported by pt and another in the truck by EMS. Pt c/o mild headache from fall off the couch.

## 2024-03-22 NOTE — ED Provider Notes (Signed)
 [facility] Provider Note    Event Date/Time   First MD Initiated Contact with Patient 03/22/24 0215     (approximate)   History   Seizures (Pt brought in by EMS after having 2 witnessed seizures.  Pt has hx of stress induced seizures.  Pt having stress at home, took klonopin  and when she woke up, she was in the back of the ambulance.  Pt states that she may have hit her head because her head is hurting a little and is feeling pulsating in both ears.  Pt is AAOx4.)   HPI  Latoya Luna is a 38 y.o. female reports a history of "stress-induced seizures", severe anxiety,   Patient reports that this evening she been under a lot of stress because of her niece who is currently hospitalized at 37 weeks pregnancy and in [place] area.  She does not respond well to life stress, reports that she was with a neighbor tonight they had pizza and a couple of tequila drinks to help with her stress.  She went home and was getting ready for bed and took her Klonopin  dose, she then reports that her daughter saw her breathing very fast and she nearly fainted.  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  She reports that she has seen neurologist, had sleep studies, seeing cardiology and has been determined that she does not respond well to being under severe stress.  She does take Klonopin  nightly that is a ongoing prescription and she has not run out of it.  Additionally, she does occasionally use alcohol but not on a regular daily basis.  Tonight she 2 drinks of tequila to compensate for the increased stress that she has been experiencing.  No pain or burning with urination.  Saw her PCP recently and is awaiting test back on a yeast infection.  Denies pregnancy.  She reported a headache earlier no longer having headache.  No neck pain.  No ongoing symptoms and she feels better at this time.  She is  experiencing a mild feeling of tightness across her chest as well, denies history of known heart disease but is followed with cardiology in the past   Physical Exam   Triage Vital Signs: ED Triage Vitals  Encounter Vitals Group     BP 03/22/24 0217 108/63     Systolic BP Percentile --      Diastolic BP Percentile --      Pulse Rate 03/22/24 0217 89     Resp 03/22/24 0217 18     Temp 03/22/24 0216 98.2 F (36.8 C)     Temp Source 03/22/24 0216 Oral     SpO2 03/22/24 0217 94 %     Weight 03/22/24 0218 150 lb 12.7 oz (68.4 kg)     Height 03/22/24 0218 5\' 7"  (1.702 m)     Head Circumference --      Peak Flow --      Pain Score 03/22/24 0218 2     Pain Loc --      Pain Education --      Exclude from Growth Chart --     Most recent vital signs: Vitals:   03/22/24 0300 03/22/24 0330  BP: 99/77 92/64  Pulse: 89 90  Resp: (!) 37 (!) 21  Temp:    SpO2: 98% 97%     General: Awake, no distress.  She  does appear somewhat anxious reports feeling anxious and under stress CV:  Good peripheral perfusion.  Normal tones and rate Resp:  Normal effort.  Clear bilateral Abd:  No distention.  Soft nontender nondistended throughout Other:  Moves all extremities well.  No focal deficits.  Extraocular movements and pupils equal and normal.  Clear speech.  Fully alert and oriented.  Normocephalic atraumatic.   ED Results / Procedures / Treatments   Labs (all labs ordered are listed, but only abnormal results are displayed) Labs Reviewed  CBC - Abnormal; Notable for the following components:      Result Value   RBC 3.53 (*)    Hemoglobin 11.4 (*)    HCT 33.5 (*)    All other components within normal limits  BASIC METABOLIC PANEL WITH GFR - Abnormal; Notable for the following components:   Potassium 3.4 (*)    CO2 19 (*)    Calcium 8.4 (*)    All other components within normal limits  HCG, QUANTITATIVE, PREGNANCY  TROPONIN I (HIGH SENSITIVITY)   Hemoglobin 11, mild anemia noted.   To reduce CO2 at about 19 suspect possibly related to anxiety state  EKG  Interpreted by me at 215 heart rate 90 QRS 80 QTc 470 Normal sinus rhythm.  Possible old anteroseptal infarct.  Q waves in V1 and V2 are old when compared with previous from September 2024   RADIOLOGY     PROCEDURES:  Critical Care performed: No  Procedures   MEDICATIONS ORDERED IN ED: Medications - No data to display   IMPRESSION / MDM / ASSESSMENT AND PLAN / ED COURSE  I reviewed the triage vital signs and the nursing notes.                              Differential diagnosis includes, but is not limited to, pseudoseizure, stress-induced abnormality, coronary vasospasm, ongoing anxiety disorder, etc.  She is fully awake alert does not take any anticoagulants has no evidence of head injury.  Fully oriented pleasant does not appear to be under severe anxiety or stress at this time reports symptoms improving.  She did use alcohol this evening and also took Klonopin  prior to the event occurring due to the increased stress that she has been experiencing her life.  In review of multiple notes she is following closely with primary care, her Klonopin  is prescription she has not run out, she does not drink alcohol daily and does not carry a known history of organic seizure disorder.  Her reevaluation at this time is quite reassuring given the ongoing symptoms of chest tightness I suspect this is likely secondary to anxiety with a low risk for coronary disease but will check ECG and troponin.  Prinzmetal would be a consideration  Patient's presentation is most consistent with acute complicated illness / injury requiring diagnostic workup.      Clinical Course as of 03/22/24 0420  Fri Mar 22, 2024  0249 Nursing reported patient having shaking activity.  She does not appear to be an active seizure however.  I walked past assessed, patient resting comfortably without distress at this time.  No obvious seizure-like  activity observed.  The patient did not urinate herself has not bit her tongue. [MQ]    Clinical Course User Index [MQ] Iver Marker, MD   Patient noted to have a normal EEG in September 2023.    ----------------------------------------- 4:20 AM on 03/22/2024 ----------------------------------------- Patient resting comfortably  with normal hemodynamics and no distress.  Reports she feels much better.  She is resting comfortably, her family including daughter resting in the bed with her.  She is in no distress.  Family here to pick her up, she is not driving self home.  Encouraged to follow-up with her primary care and also recommended she might wish to just consider some counseling services she reports that is something she feels like she needs to reengage.  Return precautions and treatment recommendations and follow-up discussed with the patient who is agreeable with the plan.   FINAL CLINICAL IMPRESSION(S) / ED DIAGNOSES   Final diagnoses:  Anxiety state     Rx / DC Orders   ED Discharge Orders     None        Note:  This document was prepared using Dragon voice recognition software and may include unintentional dictation errors.   Iver Marker, MD 03/22/24 [phone]

## 2024-03-22 NOTE — Discharge Instructions (Addendum)
 No driving today.  Please follow-up closely with your primary care doctor.

## 2024-03-25 ENCOUNTER — Other Ambulatory Visit: Payer: Self-pay | Admitting: Obstetrics and Gynecology

## 2024-03-25 DIAGNOSIS — B3731 Acute candidiasis of vulva and vagina: Secondary | ICD-10-CM

## 2024-03-25 DIAGNOSIS — B379 Candidiasis, unspecified: Secondary | ICD-10-CM

## 2024-03-25 LAB — CERVICOVAGINAL ANCILLARY ONLY
Bacterial Vaginitis (gardnerella): NEGATIVE
Candida Glabrata: NEGATIVE
Candida Vaginitis: POSITIVE — AB
Chlamydia: NEGATIVE
Comment: NEGATIVE
Comment: NEGATIVE
Comment: NEGATIVE
Comment: NEGATIVE
Comment: NEGATIVE
Comment: NORMAL
Neisseria Gonorrhea: NEGATIVE
Trichomonas: NEGATIVE

## 2024-03-25 MED ORDER — FLUCONAZOLE 150 MG PO TABS: 150.0000 mg | ORAL_TABLET | Freq: Every day | ORAL | 0 refills | Status: DC

## 2024-03-25 NOTE — Progress Notes (Signed)
Rx diflucan for yeast vag on culture

## 2024-03-28 ENCOUNTER — Other Ambulatory Visit: Payer: Self-pay | Admitting: Neurology

## 2024-03-29 NOTE — Telephone Encounter (Signed)
 Last seen on 08/17/22 No follow up scheduled

## 2024-04-04 ENCOUNTER — Other Ambulatory Visit: Payer: Self-pay | Admitting: Neurology

## 2024-04-05 MED ORDER — SUMATRIPTAN SUCCINATE 50 MG PO TABS: ORAL_TABLET | ORAL | 0 refills | Status: AC

## 2024-04-09 ENCOUNTER — Other Ambulatory Visit: Payer: Self-pay | Admitting: Obstetrics and Gynecology

## 2024-04-09 DIAGNOSIS — Z3041 Encounter for surveillance of contraceptive pills: Secondary | ICD-10-CM

## 2024-04-15 ENCOUNTER — Other Ambulatory Visit: Payer: Self-pay | Admitting: Obstetrics and Gynecology

## 2024-04-15 DIAGNOSIS — B3731 Acute candidiasis of vulva and vagina: Secondary | ICD-10-CM

## 2024-04-15 DIAGNOSIS — B379 Candidiasis, unspecified: Secondary | ICD-10-CM

## 2024-04-15 MED ORDER — FLUCONAZOLE 150 MG PO TABS: 150.0000 mg | ORAL_TABLET | Freq: Every day | ORAL | 0 refills | Status: DC

## 2024-04-15 NOTE — Progress Notes (Signed)
Rx RF diflucan for yeast vag sx 

## 2024-04-16 ENCOUNTER — Other Ambulatory Visit: Payer: Self-pay | Admitting: Obstetrics and Gynecology

## 2024-04-16 DIAGNOSIS — F32A Depression, unspecified: Secondary | ICD-10-CM

## 2024-04-16 MED ORDER — CLONAZEPAM 1 MG PO TABS: 1.0000 mg | ORAL_TABLET | Freq: Two times a day (BID) | ORAL | 0 refills | Status: DC | PRN

## 2024-04-16 NOTE — Progress Notes (Signed)
 Rx RF klonopin  prn anxiety; tries to take sparingly; significant stress causes seizures.

## 2024-05-02 ENCOUNTER — Emergency Department
Admission: EM | Admit: 2024-05-02 | Discharge: 2024-05-02 | Disposition: A | Discharge status: Home / Self Care | Attending: Emergency Medicine | Admitting: Emergency Medicine

## 2024-05-02 ENCOUNTER — Other Ambulatory Visit: Payer: Self-pay

## 2024-05-02 DIAGNOSIS — G8929 Other chronic pain: Secondary | ICD-10-CM | POA: Insufficient documentation

## 2024-05-02 DIAGNOSIS — M545 Low back pain, unspecified: Secondary | ICD-10-CM | POA: Diagnosis present

## 2024-05-02 MED ORDER — CYCLOBENZAPRINE HCL 10 MG PO TABS: 10.0000 mg | ORAL_TABLET | Freq: Three times a day (TID) | ORAL | 0 refills | Status: AC | PRN

## 2024-05-02 MED ORDER — GABAPENTIN 100 MG PO CAPS
100.0000 mg | ORAL_CAPSULE | Freq: Three times a day (TID) | ORAL | 0 refills | Status: DC
Start: 2024-05-02 — End: 2024-10-01

## 2024-05-02 MED ORDER — LIDOCAINE 5 % EX PTCH
1.0000 | MEDICATED_PATCH | CUTANEOUS | 0 refills | Status: AC
Start: 2024-05-02 — End: 2024-05-12

## 2024-05-02 NOTE — ED Notes (Signed)
 See triage note  Presents with lower back pain  States pain started a few months ago   States she has a hx of sz and and has had several falls

## 2024-05-02 NOTE — ED Provider Notes (Signed)
 [facility] Provider Note   Event Date/Time   First MD Initiated Contact with Patient 05/02/24 1338     (approximate) History  Back Pain  HPI Latoya Luna is a 38 y.o. female with a stated past medical history of chronic low back pain who presents complaining of intermittently worsening bilateral low back pain that has caused her to not be able to move as well as she wants to.  Patient states that she had a fall that started this back pain off initially however she has had further trauma to her back over the last few weeks that has worsened her pain.  Patient denies any bowel/bladder incontinence, distal paresthesias or numbness, groin anesthesia, or foot drop ROS: Patient currently denies any vision changes, tinnitus, difficulty speaking, facial droop, sore throat, chest pain, shortness of breath, abdominal pain, nausea/vomiting/diarrhea, dysuria, or weakness/numbness/paresthesias in any extremity   Physical Exam  Triage Vital Signs: ED Triage Vitals  Encounter Vitals Group     BP 05/02/24 1220 113/67     Systolic BP Percentile --      Diastolic BP Percentile --      Pulse Rate 05/02/24 1220 90     Resp 05/02/24 1220 18     Temp 05/02/24 1220 98.5 F (36.9 C)     Temp Source 05/02/24 1220 Oral     SpO2 05/02/24 1220 100 %     Weight 05/02/24 1221 150 lb (68 kg)     Height 05/02/24 1221 5\' 7"  (1.702 m)     Head Circumference --      Peak Flow --      Pain Score 05/02/24 1220 7     Pain Loc --      Pain Education --      Exclude from Growth Chart --    Most recent vital signs: Vitals:   05/02/24 1220  BP: 113/67  Pulse: 90  Resp: 18  Temp: 98.5 F (36.9 C)  SpO2: 100%   General: Awake, oriented x4. CV:  Good peripheral perfusion. Resp:  Normal effort. Abd:  No distention. Other:  Middle-aged well-developed, well-nourished Caucasian female resting comfortably in no acute distress.  Tenderness to palpation in bilateral lower paraspinal  musculature with associated spasm.  Straight leg test negative bilaterally ED Results / Procedures / Treatments  Labs (all labs ordered are listed, but only abnormal results are displayed) Labs Reviewed - No data to display PROCEDURES: Critical Care performed: No Procedures MEDICATIONS ORDERED IN ED: Medications - No data to display IMPRESSION / MDM / ASSESSMENT AND PLAN / ED COURSE  I reviewed the triage vital signs and the nursing notes.                             The patient is on the cardiac monitor to evaluate for evidence of arrhythmia and/or significant heart rate changes. Patient's presentation is most consistent with acute presentation with potential threat to life or bodily function. Patient presents for low back pain. Given History and Exam the patient appears to be at low risk for Spinal Cord Compression Syndrome, Vertebral Malignancy/Mets, acute Spinal Fracture, Vertebral Osteomyelitis, Epidural Abscess, Infected or Obstructing Kidney Stone.  Their presentation appears most likely to be secondary to non-emergent musculoskeletal etiology vs non-emergent disc herniation.  ED Workup: Defer imaging and labwork for outpatient follow up at this time.  Disposition: Discharge. Strict return precautions discussed with patient with full understanding. Advised  patient to follow up promptly with primary care provider   FINAL CLINICAL IMPRESSION(S) / ED DIAGNOSES   Final diagnoses:  Chronic bilateral low back pain without sciatica   Rx / DC Orders   ED Discharge Orders          Ordered    cyclobenzaprine (FLEXERIL) 10 MG tablet  3 times daily PRN        05/02/24 1355    gabapentin (NEURONTIN) 100 MG capsule  3 times daily        05/02/24 1355    lidocaine  (LIDODERM ) 5 %  Every 24 hours        05/02/24 1355           Note:  This document was prepared using Dragon voice recognition software and may include unintentional dictation errors.   Mally Gavina K, MD 05/02/24  [phone]

## 2024-05-02 NOTE — ED Triage Notes (Signed)
 Pt presents to the ED via POV from home for lower back pain that started a few months ago. Pt reports frequent falls due to seizures. Pt denies incontinence or sensation issues. Pt states that she feels she needs an MRI.

## 2024-06-03 ENCOUNTER — Other Ambulatory Visit: Payer: Self-pay | Admitting: Obstetrics and Gynecology

## 2024-06-03 DIAGNOSIS — A6004 Herpesviral vulvovaginitis: Secondary | ICD-10-CM

## 2024-06-04 ENCOUNTER — Other Ambulatory Visit: Payer: Self-pay | Admitting: Obstetrics and Gynecology

## 2024-06-04 DIAGNOSIS — A6004 Herpesviral vulvovaginitis: Secondary | ICD-10-CM

## 2024-06-04 MED ORDER — VALACYCLOVIR HCL 1 G PO TABS: 1000.0000 mg | ORAL_TABLET | Freq: Every day | ORAL | 0 refills | Status: DC

## 2024-06-04 NOTE — Telephone Encounter (Signed)
 Patient contacted office to inquire why her refill for Valtrex  was denied 06/03/24? Patient states that she reached out to pharmacy and states we denied refill, patient was upset stating she does not understand and that she takes medication every day. Patient would like message forwarded to PA to review. According to chart last office visit was 08/14/2023, medication was last filled 04/05/2024. Patient is also asking for a refill on Imitrex , she request both of these prescriptions sent to [facility] on [address]. KW

## 2024-06-04 NOTE — Telephone Encounter (Signed)
 Valtrex  declined by CMA due to annual due 7/25. Pt to schedule. 1 mo RF eRxd in meantime. Imitrex  not managed by us  so needs to f/u with Dr. Gregg. Pls notify pt of info. Thx.

## 2024-06-04 NOTE — Progress Notes (Signed)
 Rx RF valtrex  till 7/25 annual

## 2024-06-04 NOTE — Telephone Encounter (Signed)
 I contacted patient and she states someone from our office has called and notified her of message below. KW

## 2024-06-04 NOTE — Telephone Encounter (Signed)
Pt aware.

## 2024-06-11 ENCOUNTER — Encounter: Payer: Self-pay | Admitting: Obstetrics and Gynecology

## 2024-06-12 ENCOUNTER — Other Ambulatory Visit: Payer: Self-pay | Admitting: Certified Nurse Midwife

## 2024-06-12 DIAGNOSIS — Z87898 Personal history of other specified conditions: Secondary | ICD-10-CM

## 2024-06-12 DIAGNOSIS — F419 Anxiety disorder, unspecified: Secondary | ICD-10-CM

## 2024-06-12 MED ORDER — CLONAZEPAM 1 MG PO TABS: 1.0000 mg | ORAL_TABLET | Freq: Two times a day (BID) | ORAL | 0 refills | Status: DC | PRN

## 2024-06-12 NOTE — Progress Notes (Signed)
 Klonopin  #10 1mg  prescribed due to anxiety prior to flight with history of seizures secondary to anxiety. Appointment needed prior to any further refills.

## 2024-07-02 ENCOUNTER — Other Ambulatory Visit: Payer: Self-pay | Admitting: Obstetrics and Gynecology

## 2024-07-02 ENCOUNTER — Telehealth: Payer: Self-pay

## 2024-07-02 DIAGNOSIS — F419 Anxiety disorder, unspecified: Secondary | ICD-10-CM

## 2024-07-02 DIAGNOSIS — A6004 Herpesviral vulvovaginitis: Secondary | ICD-10-CM

## 2024-07-02 MED ORDER — VALACYCLOVIR HCL 1 G PO TABS: 1000.0000 mg | ORAL_TABLET | Freq: Every day | ORAL | 0 refills | Status: DC

## 2024-07-02 MED ORDER — CLONAZEPAM 1 MG PO TABS: 1.0000 mg | ORAL_TABLET | Freq: Two times a day (BID) | ORAL | 0 refills | Status: DC | PRN

## 2024-07-02 NOTE — Telephone Encounter (Signed)
 Rx valtrex  and clonazepam  eRxd.

## 2024-07-02 NOTE — Progress Notes (Signed)
 Rx RF valtrex /clonazepam  till 9/25 annual

## 2024-07-02 NOTE — Telephone Encounter (Signed)
 From Charlene:  Patient will be out of meds before appointment on 08/01/2024. Will be leaving to go out of town tomorrow by lunch. Her Pharmacy is [facility] on [address]. You needed the Valtrex , and anixety meds

## 2024-07-03 NOTE — Telephone Encounter (Signed)
Called pt, no answer, left detailed msg Rxs sent to pharmacy.

## 2024-07-11 ENCOUNTER — Other Ambulatory Visit (HOSPITAL_COMMUNITY)
Admission: RE | Admit: 2024-07-11 | Discharge: 2024-07-11 | Disposition: A | Discharge status: Home / Self Care | Source: Ambulatory Visit | Attending: Certified Nurse Midwife | Admitting: Certified Nurse Midwife

## 2024-07-11 ENCOUNTER — Ambulatory Visit (INDEPENDENT_AMBULATORY_CARE_PROVIDER_SITE_OTHER)

## 2024-07-11 VITALS — BP 97/71 | HR 89 | Resp 16 | Ht 67.0 in | Wt 137.0 lb

## 2024-07-11 DIAGNOSIS — N898 Other specified noninflammatory disorders of vagina: Secondary | ICD-10-CM | POA: Insufficient documentation

## 2024-07-11 DIAGNOSIS — R319 Hematuria, unspecified: Secondary | ICD-10-CM

## 2024-07-11 LAB — POCT URINALYSIS DIPSTICK
Bilirubin, UA: NEGATIVE
Glucose, UA: NEGATIVE
Ketones, UA: NEGATIVE
Leukocytes, UA: NEGATIVE
Nitrite, UA: NEGATIVE
Protein, UA: NEGATIVE
Spec Grav, UA: 1.015 (ref 1.010–1.025)
Urobilinogen, UA: 0.2 U/dL
pH, UA: 6.5 (ref 5.0–8.0)

## 2024-07-11 NOTE — Progress Notes (Signed)
    NURSE VISIT NOTE  Subjective:    Patient ID: Latoya Luna, female    DOB: February 23, 1986, 38 y.o.   MRN: [id number]  HPI  Patient is a 38 y.o. G35P3003 female who presents for vaginal irritation x 1 week. Denies abnormal vaginal bleeding or significant pelvic pain or fever. denies dysuria, hematuria, urinary frequency, urinary urgency, flank pain, pelvic pain, and vaginal discharge. Patient denies history of known exposure to STD.   Objective:    BP 97/71   Pulse 89   Resp 16   Ht 5' 7 (1.702 m)   Wt 137 lb (62.1 kg)   BMI 21.46 kg/m    Lab Results  Component Value Date   COLORU yellow 05/05/2020   CLARITYU clear 05/05/2020   GLUCOSEUR Negative 07/11/2024   BILIRUBINUR Negative 07/11/2024   KETONESU Negative 07/11/2024   SPECGRAV 1.015 07/11/2024   RBCUR Trace 07/11/2024   PHUR 6.5 07/11/2024   PROTEINUR Negative 07/11/2024   UROBILINOGEN 0.2 07/11/2024   LEUKOCYTESUR Negative 07/11/2024     Assessment:   1. Vaginal discharge     bacterial vaginosis and nonspecific vaginitis  Plan:   Urine Culture Sent  GC and chlamydia DNA  probe sent to lab. Treatment: see results for additional treatments. PhD Samples were given to patient. ROV prn if symptoms persist or worsen.   Camelia Fetters, CMA  [facility]

## 2024-07-11 NOTE — Patient Instructions (Signed)
 Vaginal Infection (Bacterial Vaginosis): What to Know  Bacterial vaginosis is an infection of the vagina. It happens when the balance of normal germs (bacteria) in the vagina changes. If you don't get treated, it can make it easier for you to get other infections from sex. These are called sexually transmitted infections (STIs). If you're pregnant, you need to get treated right away. This infection can cause a baby to be born early or at a low birth weight. What are the causes? This infection happens when too many harmful germs grow in the vagina. You can't get this infection from toilet seats, bedsheets, swimming pools, or things that touch your vagina. What increases the risk? Having sex with a new person or more than one person. Having sex without protection. Douching. Having an intrauterine device (IUD). Smoking. Using drugs or drinking alcohol. These can lead you to do risky things. Taking certain antibiotics. Being pregnant. What are the signs or symptoms? Some females have no symptoms. Symptoms may include: A gray or white discharge from your vagina. It can be watery or foamy. A fishy smell. This can happen after sex or during your menstrual period. Itching in and around your vagina. Burning or pain when you pee. How is this treated? This infection is treated with antibiotics. These may be given to you as: A pill. A cream for your vagina. A medicine that you put into your vagina (suppository). If the infection comes back, you may need more antibiotics. Follow these instructions at home: Medicines Take your medicines as told. Take or use your antibiotics as told. Do not stop using them even if you start to feel better. General instructions If the person you have sex with is a female, tell her that you have this infection. She will need to follow up with her doctor. Female partners don't need to be treated. Do not have sex until you finish treatment. Drink more fluids as  told. Keep your vagina and butt clean. Wash these areas with warm water each day. Wipe from front to back after you poop. If you're breastfeeding a baby, talk to your doctor if you should keep doing so during treatment. How is this prevented? Self-care Do not douche. Do not use deodorant sprays on your vagina. Wear cotton underwear. Do not wear tight pants and pantyhose, especially in the summer. Safe sex Use condoms the correct way and every time you have sex. Use dental dams to protect yourself during oral sex. Limit how many people you have sex with. Get tested for STIs. The person you have sex with should also get tested. Drugs and alcohol Do not smoke, vape, or use nicotine or tobacco. Do not use drugs. Limit the amount of alcohol you drink because it can lead you to do risky things. Where to find more information To learn more: Go to TonerPromos.no. Click Health Topics A-Z. Type bacterial vaginosis in the search bar. American Sexual Health Association (ASHA): ashasexualhealth.org U.S. Department of Health and [employer], Office on Women's Health: TravelLesson.ca Contact a doctor if: Your symptoms don't get better, even after treatment. You have more discharge or pain when you pee. You have a fever or chills. You have pain in your belly or in the area between your hips. You have pain during sex. You bleed from your vagina between menstrual periods. This information is not intended to replace advice given to you by your health care provider. Make sure you discuss any questions you have with your health care provider. Document  Revised: 05/03/2023 Document Reviewed: 05/03/2023 Elsevier Patient Education  2024 [employer].

## 2024-07-12 DIAGNOSIS — B9689 Other specified bacterial agents as the cause of diseases classified elsewhere: Secondary | ICD-10-CM

## 2024-07-12 DIAGNOSIS — B379 Candidiasis, unspecified: Secondary | ICD-10-CM

## 2024-07-12 LAB — CERVICOVAGINAL ANCILLARY ONLY
Bacterial Vaginitis (gardnerella): POSITIVE — AB
Candida Glabrata: NEGATIVE
Candida Vaginitis: POSITIVE — AB
Comment: NEGATIVE
Comment: NEGATIVE
Comment: NEGATIVE

## 2024-07-12 MED ORDER — FLUCONAZOLE 150 MG PO TABS: 150.0000 mg | ORAL_TABLET | Freq: Every day | ORAL | 0 refills | Status: DC

## 2024-07-12 MED ORDER — METRONIDAZOLE 500 MG PO TABS: 500.0000 mg | ORAL_TABLET | Freq: Two times a day (BID) | ORAL | 0 refills | Status: DC

## 2024-07-13 LAB — URINE CULTURE

## 2024-07-14 ENCOUNTER — Ambulatory Visit: Payer: Self-pay | Admitting: Obstetrics and Gynecology

## 2024-07-14 ENCOUNTER — Other Ambulatory Visit: Payer: Self-pay | Admitting: Obstetrics and Gynecology

## 2024-07-14 MED ORDER — CLINDAMYCIN HCL 300 MG PO CAPS: 300.0000 mg | ORAL_CAPSULE | Freq: Two times a day (BID) | ORAL | 0 refills | Status: AC

## 2024-07-14 NOTE — Progress Notes (Signed)
 Rx clindamycin  for BV; can't take flagyl  due to upcoming alcohol use

## 2024-07-16 ENCOUNTER — Other Ambulatory Visit: Payer: Self-pay | Admitting: Obstetrics and Gynecology

## 2024-07-16 DIAGNOSIS — Z3041 Encounter for surveillance of contraceptive pills: Secondary | ICD-10-CM

## 2024-07-17 ENCOUNTER — Other Ambulatory Visit: Payer: Self-pay

## 2024-07-17 DIAGNOSIS — Z3041 Encounter for surveillance of contraceptive pills: Secondary | ICD-10-CM

## 2024-07-17 MED ORDER — NORETHINDRONE 0.35 MG PO TABS: 1.0000 | ORAL_TABLET | Freq: Every day | ORAL | 0 refills | Status: DC

## 2024-07-31 NOTE — Progress Notes (Unsigned)
 PCP:  Pcp, No   No chief complaint on file.    HPI:      Latoya Luna is a 38 y.o. [phone] whose LMP was No LMP recorded., presents today for her annual examination.  Her menses were Q3 months on seasonale, lasting 5-7 days, no BTB without late pills, mild dysmen. Was having increased anxiety sx and pt wanted hormone levels checked. Slater Rains checked them while pt on OCPs and levels were abn 3/24. Pt stopped OCPs and started HRT for a short time. Levels then rechecked off HRT and they were normal 7/24. Pt has had 2 periods since off OCPs, 1 was normal; LMP 06/14/22 was only 2 days. Pt would like to go back on OCPs but do monthly because wants to conceive again next year.   Sex activity: single partner, contraception - was on OCPs, wants to restart. No pain/bleeding/dryness.  Last Pap: 03/09/21  Results were: no abnormalities /neg HPV DNA  Hx of STD: HSV 2; takes 1 g valtrex  daily as preventive; anxiety is a trigger for sx; not controlled in past with 500 mg dose Hx of BV and yeast on culture  Last mammo: never There is a FH of breast cancer in her pat aunt, PGM and prostate cancer in her dad. There is no FH of ovarian cancer. There is a FH of pancreatic cancer in her mat grt aunt. Pt is MyRisk neg. IBIS=23%/riskscore=33%.  The patient does not do self-breast exams. Not taking Vit D supp.   Tobacco use: The patient denies current or previous tobacco use. Alcohol use: social drinker No drug use.  Exercise: moderately active; also helps with anxiety sx  She does get adequate calcium but not Vitamin D  in her diet.  Hx of anxiety/depression/PTSD; has had a hard couple of yrs with loss of fiance; was on paxil  and lexapro  but doesn't take it long enough; prefers to do 1/2 to 1 tab klonopin  daily as needed; usually takes 1/2 tab at bedtime to sleep. Has taken trazodone  for sleep in past with sx relief. No success with unisom/melatonin.  Willing to try it again. Pt not exercising much  now and plans to increase, has helped before. Does get episodic panic attacks. Has seen therapist. Discussed importance of treating anxiety with SSRI and not temporary sx relief with benzo, as well as addictive potential and new studies showing brain alterations with regular use.  Pt willing to try sertraline  since safe with pregnancy in future. 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   Encounter for surveillance of contraceptive pills 12/09/2020   Anxiety and depression 05/05/2020   Herpes simplex 11/26/2014   History of brain disorder 09/04/2014   History of seizure 09/04/2014    Past Surgical History:  Procedure Laterality Date   BREAST SURGERY      Family History  Problem Relation Age of Onset   Prostate cancer Father 70   Breast cancer Paternal Grandmother 83   Pancreatic cancer Maternal Aunt 58   Breast cancer Paternal Aunt 68    Social History   Socioeconomic History   Marital status: Single    Spouse name: Not on file   Number of children: Not on file   Years of education: Not on file   Highest education level: Not on file  Occupational History   Not on file  Tobacco Use   Smoking status:  Never   Smokeless tobacco: Never  Vaping Use   Vaping status: Never Used  Substance and Sexual Activity   Alcohol use: Yes    Comment: occ   Drug use: No   Sexual activity: Yes    Birth control/protection: None    Comment: partner had vasectomy  Other Topics Concern   Not on file  Social History Narrative   Not on file   Social Drivers of Health   Financial Resource Strain: Not on file  Food Insecurity: Not on file  Transportation Needs: Not on file  Physical Activity: Not on file  Stress: Not on file  Social Connections: Not on file  Intimate Partner Violence: Not on file     Current Outpatient Medications:    clonazePAM  (KLONOPIN ) 1 MG tablet, Take 1  tablet (1 mg total) by mouth 2 (two) times daily as needed. for anxiety, Disp: 20 tablet, Rfl: 0   fluconazole  (DIFLUCAN ) 150 MG tablet, Take 1 tablet (150 mg total) by mouth daily., Disp: 1 tablet, Rfl: 0   fluconazole  (DIFLUCAN ) 150 MG tablet, Take 1 tablet (150 mg total) by mouth daily., Disp: 1 tablet, Rfl: 0   gabapentin  (NEURONTIN ) 100 MG capsule, Take 1 capsule (100 mg total) by mouth 3 (three) times daily for 10 days., Disp: 30 capsule, Rfl: 0   Multiple Vitamin (MULTIVITAMIN WITH MINERALS) TABS tablet, Take 1 tablet by mouth daily., Disp: , Rfl:    norethindrone  (MICRONOR ) 0.35 MG tablet, Take 1 tablet (0.35 mg total) by mouth daily., Disp: 84 tablet, Rfl: 0   ondansetron  (ZOFRAN -ODT) 4 MG disintegrating tablet, Take 1 tablet (4 mg total) by mouth every 8 (eight) hours as needed for nausea or vomiting., Disp: 20 tablet, Rfl: 0   sertraline  (ZOLOFT ) 100 MG tablet, Take 1 tablet (100 mg total) by mouth daily. Take 1/2 tab daily for 6 days, then 1 tab daily, Disp: 90 tablet, Rfl: 2   SUMAtriptan  (IMITREX ) 50 MG tablet, TAKE 1 TABLET BY MOUTH AT ONSET OF MIGRAINE. MAY REPEAT IN 2 HOURS IF HEADACHE PERSISTS OR RECURS., Disp: 10 tablet, Rfl: 0   valACYclovir  (VALTREX ) 1000 MG tablet, Take 1 tablet (1,000 mg total) by mouth daily., Disp: 30 tablet, Rfl: 0     ROS:  Review of Systems  Constitutional:  Negative for fatigue, fever and unexpected weight change.  Respiratory:  Negative for cough, shortness of breath and wheezing.   Cardiovascular:  Negative for chest pain, palpitations and leg swelling.  Gastrointestinal:  Positive for constipation. Negative for blood in stool, diarrhea, nausea and vomiting.  Endocrine: Negative for cold intolerance, heat intolerance and polyuria.  Genitourinary:  Negative for dyspareunia, dysuria, flank pain, frequency, genital sores, hematuria, menstrual problem, pelvic pain, urgency, vaginal bleeding, vaginal discharge and vaginal pain.  Musculoskeletal:   Negative for back pain, joint swelling and myalgias.  Skin:  Negative for rash.  Neurological:  Positive for headaches. Negative for dizziness, syncope, light-headedness and numbness.  Hematological:  Negative for adenopathy.  Psychiatric/Behavioral:  Positive for agitation and dysphoric mood. Negative for confusion, sleep disturbance and suicidal ideas. The patient is not nervous/anxious.    BREAST: No symptoms   Objective: There were no vitals taken for this visit.   Physical Exam Constitutional:      Appearance: She is well-developed.  Genitourinary:     Vulva normal.     Right Labia: No rash, tenderness or lesions.    Left Labia: No tenderness, lesions or rash.    No vaginal discharge,  erythema or tenderness.      Right Adnexa: not tender and no mass present.    Left Adnexa: not tender and no mass present.    No cervical friability or polyp.     Uterus is not enlarged or tender.  Breasts:    Right: No mass, nipple discharge, skin change or tenderness.     Left: No mass, nipple discharge, skin change or tenderness.  Neck:     Thyroid: No thyromegaly.  Cardiovascular:     Rate and Rhythm: Normal rate and regular rhythm.     Heart sounds: Normal heart sounds. No murmur heard. Pulmonary:     Effort: Pulmonary effort is normal.     Breath sounds: Normal breath sounds.  Abdominal:     Palpations: Abdomen is soft.     Tenderness: There is no abdominal tenderness. There is no guarding or rebound.  Musculoskeletal:        General: Normal range of motion.     Cervical back: Normal range of motion.  Lymphadenopathy:     Cervical: No cervical adenopathy.  Neurological:     General: No focal deficit present.     Mental Status: She is alert and oriented to person, place, and time.     Cranial Nerves: No cranial nerve deficit.  Skin:    General: Skin is warm and dry.  Psychiatric:        Mood and Affect: Mood normal.        Behavior: Behavior normal.        Thought Content:  Thought content normal.        Judgment: Judgment normal.  Vitals reviewed.     Results:    08/14/2023    9:45 AM 06/22/2023    9:57 AM 02/21/2023    3:32 PM  GAD 7 : Generalized Anxiety Score  Nervous, Anxious, on Edge 2 3 3   Control/stop worrying 2 3 3   Worry too much - different things 2 3 3   Trouble relaxing 2 3 3   Restless 2 3 3   Easily annoyed or irritable 2 3 3   Afraid - awful might happen 2 3 3   Total GAD 7 Score 14 21 21   Anxiety Difficulty  Somewhat difficult Very difficult       08/14/2023    9:45 AM  Depression screen PHQ 2/9  Decreased Interest 2  Down, Depressed, Hopeless 1  PHQ - 2 Score 3  Altered sleeping 3  Tired, decreased energy 2  Change in appetite 1  Feeling bad or failure about yourself  2  Trouble concentrating 1  Moving slowly or fidgety/restless 0  Suicidal thoughts 0  PHQ-9 Score 12     Assessment/Plan: Encounter for annual routine gynecological examination  Encounter for initial prescription of contraceptive pills - Plan: levonorgestrel -ethinyl estradiol  (AVIANE) 0.1-20 MG-MCG tablet; OCP start today, may have BTB. Condoms for 1 wk.   Encounter for screening mammogram for malignant neoplasm of breast - Plan: MM 3D SCREENING MAMMOGRAM BILATERAL BREAST; pt to schedule mammo  Family history of breast cancer - Plan: MM 3D SCREENING MAMMOGRAM BILATERAL BREAST; pt is MyRisk neg  Increased risk of breast cancer - Plan: MM 3D SCREENING MAMMOGRAM BILATERAL BREAST; aware of recommendations of monthly SBE, yearly CBE and mammos, as well as scr breast MRI. Schedule mammo, will call for MRI ref prn. Add Vit D supp.   Anxiety and depression - Plan: sertraline  (ZOLOFT ) 50 MG tablet; sx not controlled with regular benzo use. Rx sertraline , RTO  in 7 wks for f/u; sooner prn. Add exercise.   Insomnia due to other mental disorder - Plan: traZODone  (DESYREL ) 50 MG tablet; d/c klonopin  to sleep, start trazodone . Increase exercise. F/u prn.   Herpes simplex  vulvovaginitis - Plan: valACYclovir  (VALTREX ) 1000 MG tablet; Rx RF.   No orders of the defined types were placed in this encounter.            GYN counsel breast self exam, mammography screening, adequate intake of calcium and vitamin D , diet and exercise     F/U  No follow-ups on file.  Dantonio Justen B. Keigan Tafoya, PA-C 07/31/2024 12:17 PM

## 2024-08-01 ENCOUNTER — Ambulatory Visit (INDEPENDENT_AMBULATORY_CARE_PROVIDER_SITE_OTHER): Admitting: Obstetrics and Gynecology

## 2024-08-01 ENCOUNTER — Other Ambulatory Visit (HOSPITAL_COMMUNITY)
Admission: RE | Admit: 2024-08-01 | Discharge: 2024-08-01 | Disposition: A | Discharge status: Home / Self Care | Source: Ambulatory Visit | Attending: Obstetrics and Gynecology | Admitting: Obstetrics and Gynecology

## 2024-08-01 ENCOUNTER — Encounter: Payer: Self-pay | Admitting: Obstetrics and Gynecology

## 2024-08-01 VITALS — BP 93/66 | HR 88 | Ht 67.0 in | Wt 140.0 lb

## 2024-08-01 DIAGNOSIS — Z113 Encounter for screening for infections with a predominantly sexual mode of transmission: Secondary | ICD-10-CM

## 2024-08-01 DIAGNOSIS — Z01411 Encounter for gynecological examination (general) (routine) with abnormal findings: Secondary | ICD-10-CM | POA: Diagnosis not present

## 2024-08-01 DIAGNOSIS — N760 Acute vaginitis: Secondary | ICD-10-CM

## 2024-08-01 DIAGNOSIS — B009 Herpesviral infection, unspecified: Secondary | ICD-10-CM

## 2024-08-01 DIAGNOSIS — Z1151 Encounter for screening for human papillomavirus (HPV): Secondary | ICD-10-CM

## 2024-08-01 DIAGNOSIS — Z124 Encounter for screening for malignant neoplasm of cervix: Secondary | ICD-10-CM | POA: Insufficient documentation

## 2024-08-01 DIAGNOSIS — F32A Depression, unspecified: Secondary | ICD-10-CM

## 2024-08-01 DIAGNOSIS — F419 Anxiety disorder, unspecified: Secondary | ICD-10-CM

## 2024-08-01 DIAGNOSIS — Z9189 Other specified personal risk factors, not elsewhere classified: Secondary | ICD-10-CM

## 2024-08-01 DIAGNOSIS — A6004 Herpesviral vulvovaginitis: Secondary | ICD-10-CM

## 2024-08-01 DIAGNOSIS — Z803 Family history of malignant neoplasm of breast: Secondary | ICD-10-CM

## 2024-08-01 DIAGNOSIS — Z1231 Encounter for screening mammogram for malignant neoplasm of breast: Secondary | ICD-10-CM

## 2024-08-01 DIAGNOSIS — Z3041 Encounter for surveillance of contraceptive pills: Secondary | ICD-10-CM

## 2024-08-01 DIAGNOSIS — Z30014 Encounter for initial prescription of intrauterine contraceptive device: Secondary | ICD-10-CM

## 2024-08-01 DIAGNOSIS — Z01419 Encounter for gynecological examination (general) (routine) without abnormal findings: Secondary | ICD-10-CM

## 2024-08-01 MED ORDER — VALACYCLOVIR HCL 1 G PO TABS: 1000.0000 mg | ORAL_TABLET | Freq: Every day | ORAL | 3 refills | Status: AC

## 2024-08-01 MED ORDER — CLONAZEPAM 1 MG PO TABS: 1.0000 mg | ORAL_TABLET | Freq: Two times a day (BID) | ORAL | 0 refills | Status: DC | PRN

## 2024-08-01 MED ORDER — ESCITALOPRAM OXALATE 10 MG PO TABS: ORAL_TABLET | ORAL | 1 refills | Status: DC

## 2024-08-01 NOTE — Patient Instructions (Addendum)
 I value your feedback and you entrusting [place] with your care. If you get a [facility] patient survey, I would appreciate you taking the time to let us know about your experience today. Thank you!  [facility] ([place])--[phone]

## 2024-08-05 ENCOUNTER — Ambulatory Visit: Payer: Self-pay | Admitting: Obstetrics and Gynecology

## 2024-08-05 LAB — CYTOLOGY - PAP
Chlamydia: NEGATIVE
Comment: NEGATIVE
Comment: NEGATIVE
Comment: NEGATIVE
Comment: NORMAL
Diagnosis: HIGH — AB
High risk HPV: POSITIVE — AB
Neisseria Gonorrhea: NEGATIVE
Trichomonas: NEGATIVE

## 2024-09-04 NOTE — Patient Instructions (Signed)
 Colposcopy, Care After  The following information offers guidance on how to care for yourself after your procedure. Your doctor may also give you more specific instructions. If you have problems or questions, contact your doctor. What can I expect after the procedure? If you did not have a sample of your tissue taken out (did not have a biopsy), you may only have some spotting of blood for a few days. You can go back to your normal activities. If you had a sample of your tissue taken out, it is common to have: Soreness and mild pain. These may last for a few days. Mild bleeding or fluid (discharge) coming from your vagina. The fluid will look dark and grainy. You may have this for a few days. The fluid may be caused by a liquid that was used during your procedure. You may need to wear a sanitary pad. Spotting of blood for at least 48 hours after the procedure. Follow these instructions at home: Medicines Take over-the-counter and prescription medicines only as told by your doctor. Ask your doctor what over-the-counter pain medicines and prescription medicines you can start taking again. This is very important if you take blood thinners. Activity For at least 3 days, or for as long as told by your doctor, avoid: Douching. Using tampons. Having sex. Return to your normal activities as told by your doctor. Ask your doctor what activities are safe for you. General instructions Ask your doctor if you may take baths, swim, or use a hot tub. You may take showers. If you use birth control (contraception), keep using it. Keep all follow-up visits. Contact a doctor if: You have a fever or chills. You faint or feel light-headed. Get help right away if: You bleed a lot from your vagina. A lot of bleeding means that the bleeding soaks through a pad in less than 1 hour. You have clumps of blood (blood clots) coming from your vagina. You have signs that could mean you have an infection. This may be  fluid coming from your vagina that is: Different than normal. Yellow. Bad-smelling. You have very bad pain or cramps in your lower belly that do not get better with medicine. Summary If you did not have a sample of your tissue taken out, you may only have some spotting of blood for a few days. You can go back to your normal activities. If you had a sample of your tissue taken out, it is common to have mild pain for a few days and spotting for 48 hours. Avoid douching, using tampons, and having sex for at least 3 days after the procedure or for as long as told. Get help right away if you have a lot of bleeding, very bad pain, or signs of infection. This information is not intended to replace advice given to you by your health care provider. Make sure you discuss any questions you have with your health care provider. Document Revised: 04/11/2021 Document Reviewed: 04/11/2021 Elsevier Patient Education  2024 [employer].

## 2024-09-04 NOTE — Progress Notes (Signed)
 Referring Provider:  Bernarda Schroeder PA-C  HPI:  KYONA CHAUNCEY is a 38 y.o.  [phone]  who presents today for evaluation and management of abnormal cervical cytology.    Prior pap smears:  08/01/2024:  HSIL HPV + (undiff) 03/09/2024:  NILM 02/20/2020:  NILM  Prior cervical / vaginal findings: unsure, believes she had abnormal pap years ago but cannot remember if she had a colposcopy or cryotherapy, unsure and no records available  Prior cervical treatment(s): NA  Symptoms/History:  -Abnormal vaginal discharge: none -Postmenopausal: no -Intermenstrual bleeding: sometimes -Postcoital bleeding: none -Bleeding problems (non-gyn): none -Contraception: Micronor  -Number of current sexual partners: 1 -Number of partners in lifetime: 7 -History of a high risk partner: none -History of STDs: HPV -Smoking: none -Gardasil Vaccine: unsure      ROS:  Pertinent items are noted in HPI.  OB History  Gravida Para Term Preterm AB Living  3 3 3  0 0 3  SAB IAB Ectopic Multiple Live Births  0 0 0  3    # Outcome Date GA Lbr Len/2nd Weight Sex Type Anes PTL Lv  3 Term 04/09/15 [redacted]w[redacted]d  7 lb 8.5 oz (3.416 kg) F Vag-Spont   LIV  2 Term 12/04/12   7 lb 2 oz (3.232 kg) M Vag-Spont   LIV  1 Term 01/30/10   6 lb 12 oz (3.062 kg) F Vag-Spont   LIV    Past Medical History:  Diagnosis Date   Anxiety    BRCA negative 02/2021   MyRisk neg   Cancer (HCC)    cervical cells   Depression    Family history of breast cancer    Increased risk of breast cancer 02/2021   IBIS=23%/riskscore=33%   Pleurisy    Seizures (HCC)    last seizure 5 years ago    Past Surgical History:  Procedure Laterality Date   BREAST SURGERY      SOCIAL HISTORY:  Social History   Substance and Sexual Activity  Alcohol Use Yes   Comment: occ    Social History   Substance and Sexual Activity  Drug Use No     Family History  Problem Relation Age of Onset   Prostate cancer Father 62   Breast cancer  Paternal Grandmother 34   Pancreatic cancer Maternal Aunt 66   Breast cancer Paternal Aunt 23    ALLERGIES:  Patient has no known allergies.  She has a current medication list which includes the following prescription(s): clonazepam , escitalopram , multivitamin with minerals, norethindrone , ondansetron , sumatriptan , valacyclovir , gabapentin , tizanidine , and trazodone .  Physical Exam: -Vitals:  BP 116/87   Pulse 71   Wt 147 lb 3.2 oz (66.8 kg)   LMP 08/21/2024 (Exact Date)   BMI 23.05 kg/m   PROCEDURE: Colposcopy performed with 4% acetic acid and Lugol's after informed consent obtained. Urine pregnancy test negative.  Physical Exam Vitals and nursing note reviewed. Exam conducted with a chaperone present.  Constitutional:      Appearance: Normal appearance.  HENT:     Head: Normocephalic and atraumatic.  Eyes:     Extraocular Movements: Extraocular movements intact.  Pulmonary:     Effort: Pulmonary effort is normal.  Abdominal:     General: Abdomen is flat.  Genitourinary:    General: Normal vulva.     Vagina: Normal.        Comments: RED = acetowhite lesions BLUE = biopsies  Neurological:     General: No focal deficit present.  Mental Status: She is alert.  Psychiatric:        Mood and Affect: Mood normal.                               -Aceto-white Lesions Location(s): See above              -Biopsy performed at 11, 4, 6 o'clock               -ECC indicated and performed: Yes.       -Biopsy sites made hemostatic with pressure and Monsel's solution   -Satisfactory colposcopy: Yes.      -Evidence of Invasive cervical CA :  NO  ASSESSMENT:  Letishia T Melder is a 38 y.o. [id number] with HSIL and HPV-HR positive, undifferentiated on recent pap (08/01/24), here for colposcopy today, performed as above without complications.  -ECC and 3 cervical bx sent to pathology -Aftercare instructions for home reviewed, si/sx of when to call/return discussed. -Gardasil  counseling done, #1 given today; Flu vaccine also given today -Follow up 2wks to discuss results and next steps   Estil Mangle, DO [facility]

## 2024-09-10 ENCOUNTER — Other Ambulatory Visit (HOSPITAL_COMMUNITY)
Admission: RE | Admit: 2024-09-10 | Discharge: 2024-09-10 | Disposition: A | Discharge status: Home / Self Care | Source: Ambulatory Visit | Attending: Obstetrics | Admitting: Obstetrics

## 2024-09-10 ENCOUNTER — Ambulatory Visit (INDEPENDENT_AMBULATORY_CARE_PROVIDER_SITE_OTHER): Admitting: Obstetrics

## 2024-09-10 VITALS — BP 116/87 | HR 71 | Wt 147.2 lb

## 2024-09-10 DIAGNOSIS — R87613 High grade squamous intraepithelial lesion on cytologic smear of cervix (HGSIL): Secondary | ICD-10-CM

## 2024-09-10 DIAGNOSIS — Z3202 Encounter for pregnancy test, result negative: Secondary | ICD-10-CM | POA: Diagnosis not present

## 2024-09-10 DIAGNOSIS — Z01812 Encounter for preprocedural laboratory examination: Secondary | ICD-10-CM

## 2024-09-10 DIAGNOSIS — Z23 Encounter for immunization: Secondary | ICD-10-CM | POA: Diagnosis not present

## 2024-09-10 DIAGNOSIS — D069 Carcinoma in situ of cervix, unspecified: Secondary | ICD-10-CM

## 2024-09-10 LAB — POCT URINE PREGNANCY: Preg Test, Ur: NEGATIVE

## 2024-09-11 NOTE — Progress Notes (Deleted)
 Pcp, No   No chief complaint on file.   HPI:      [name] is a 38 y.o. 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   Encounter for surveillance of contraceptive pills 12/09/2020   Anxiety and depression 05/05/2020   Herpes simplex 11/26/2014   History of brain disorder 09/04/2014   History of seizure 09/04/2014    Past Surgical History:  Procedure Laterality Date   BREAST SURGERY      Family History  Problem Relation Age of Onset   Prostate cancer Father 55   Breast cancer Paternal Grandmother 37   Pancreatic cancer Maternal Aunt 32   Breast cancer Paternal Aunt 42    Social History   Socioeconomic History   Marital status: Single    Spouse name: Not on file   Number of children: Not on file   Years of education: Not on file   Highest education level: Not on file  Occupational History   Not on file  Tobacco Use   Smoking status: Never   Smokeless tobacco: Never  Vaping Use   Vaping status: Never Used  Substance and Sexual Activity   Alcohol use: Yes    Comment: occ   Drug use: No   Sexual activity: Yes    Birth control/protection: Pill  Other Topics Concern   Not on file  Social History Narrative   Not on file   Social Drivers of Health   Financial Resource Strain: Not on file  Food Insecurity: Not on file  Transportation Needs: Not on file  Physical Activity: Not on file  Stress: Not on file  Social Connections: Not on file  Intimate Partner Violence: Not on file    Outpatient Medications Prior to Visit  Medication Sig Dispense Refill   clonazePAM  (KLONOPIN ) 1 MG tablet Take 1 tablet (1 mg total) by mouth 2 (two) times daily as needed. for anxiety 30 tablet 0   escitalopram  (LEXAPRO ) 10 MG tablet Take 1/2 tab daily for 6 days, then 1 tab daily 30 tablet 1    gabapentin  (NEURONTIN ) 100 MG capsule Take 1 capsule (100 mg total) by mouth 3 (three) times daily for 10 days. (Patient not taking: Reported on 09/10/2024) 30 capsule 0   Multiple Vitamin (MULTIVITAMIN WITH MINERALS) TABS tablet Take 1 tablet by mouth daily.     norethindrone  (MICRONOR ) 0.35 MG tablet Take 1 tablet (0.35 mg total) by mouth daily. 84 tablet 0   ondansetron  (ZOFRAN -ODT) 4 MG disintegrating tablet Take 1 tablet (4 mg total) by mouth every 8 (eight) hours as needed for nausea or vomiting. 20 tablet 0   SUMAtriptan  (IMITREX ) 50 MG tablet TAKE 1 TABLET BY MOUTH AT ONSET OF MIGRAINE. MAY REPEAT IN 2 HOURS IF HEADACHE PERSISTS OR RECURS. 10 tablet 0   tiZANidine  (ZANAFLEX ) 4 MG tablet Take 4 mg by mouth every 8 (eight) hours as needed. (Patient not taking: Reported on 09/10/2024)     traZODone  (DESYREL ) 50 MG tablet Take 50 mg by mouth at bedtime as needed. for sleep (Patient not taking: Reported on 09/10/2024)     valACYclovir  (VALTREX ) 1000 MG tablet Take 1 tablet (1,000 mg total) by mouth daily. 90 tablet 3   No facility-administered medications prior to visit.      ROS:  Review of Systems BREAST: No  symptoms   OBJECTIVE:   Vitals:  LMP 08/21/2024 (Exact Date)   Physical Exam  Results: No results found for this or any previous visit (from the past 24 hours).   Assessment/Plan: No diagnosis found.    No orders of the defined types were placed in this encounter.     No follow-ups on file.  Jorryn Hershberger B. Berkley Wrightsman, PA-C 09/11/2024 8:27 PM

## 2024-09-12 ENCOUNTER — Ambulatory Visit: Admitting: Obstetrics and Gynecology

## 2024-09-12 DIAGNOSIS — F419 Anxiety disorder, unspecified: Secondary | ICD-10-CM

## 2024-09-12 LAB — SURGICAL PATHOLOGY

## 2024-09-19 ENCOUNTER — Ambulatory Visit: Payer: Self-pay | Admitting: Obstetrics

## 2024-09-25 ENCOUNTER — Telehealth: Payer: Self-pay

## 2024-09-25 ENCOUNTER — Ambulatory Visit: Admitting: Obstetrics

## 2024-09-25 VITALS — BP 98/69 | HR 75 | Ht 67.0 in | Wt 144.8 lb

## 2024-09-25 DIAGNOSIS — N871 Moderate cervical dysplasia: Secondary | ICD-10-CM

## 2024-09-25 DIAGNOSIS — D069 Carcinoma in situ of cervix, unspecified: Secondary | ICD-10-CM | POA: Diagnosis not present

## 2024-09-25 NOTE — Progress Notes (Signed)
    GYNECOLOGY PROGRESS NOTE  Subjective:  PCP: Pcp, No  Patient ID: Latoya Luna, female    DOB: Dec 21, 1985, 38 y.o.   MRN: [id number]  HPI  Patient is a 38 y.o. [id number] female who presents to discuss colposcopy results.  Colposcopy done 09/10/24 for HSIL HPV + pap.  The following portions of the patient's history were reviewed and updated as appropriate: allergies, current medications, past family history, past medical history, past social history, past surgical history, and problem list.  Review of Systems Pertinent items are noted in HPI.   Objective:   Blood pressure 98/69, pulse 75, height 5' 7 (1.702 m), weight 144 lb 12.8 oz (65.7 kg), last menstrual period 08/21/2024. Body mass index is 22.68 kg/m.  General appearance: alert and cooperative Abdomen: soft, non-tender; bowel sounds normal; no masses,  no organomegaly Pelvic: deferred Extremities: extremities normal, atraumatic, no cyanosis or edema Neurologic: Grossly normal  Pathology  09/10/24  FINAL MICROSCOPIC DIAGNOSIS:  A. CERVIX, 11 O'CLOCK, BIOPSY: Detached squamous cells and scanty benign endocervical mucosa. Negative for dysplasia.  B. CERVIX, 4 O'CLOCK, BIOPSY: Low-grade squamous intraepithelial lesion, CIN-1 (low-grade dysplasia).  C. CERVIX, 6 O'CLOCK, BIOPSY: High-grade squamous intraepithelial lesion, CIN-2 and CIN-3 (high-grade dysplasia).  D. ENDOCERVIX, CURETTAGE: Benign endocervical mucosa.   Assessment/Plan:   1. High grade squamous intraepithelial lesion (HGSIL), grade 3 CIN, on biopsy of cervix   2. Dysplasia of cervix, high grade CIN 2   38 y.o. [id number] with CIN2-3 at 6:00 colposcopic biopsy, here today to discuss further treatment. We discussed recommendation for excisional treatment via LEEP and pt prefers sedation. Will schedule her in same-day surgery.  -Requesting 10/28/24 -Plan for 2wk post-op visit -Requested Klonipin refill at end of visit, this medication has been managed by  Bernarda Schroeder, PA -- will send Rx request to Bernarda; pt aware once Bernarda lucky from practice at end of 2025, will need to transfer this medication to a PCP.   Estil Mangle, DO [facility]

## 2024-09-25 NOTE — Telephone Encounter (Signed)
 Patient came in to see Dr. Leigh for Colpo and asked Dr. Leigh if she can refill clonazePAM  (KLONOPIN ) 1 MG tablet . Please advise and route msg to [facility] or [facility] [employer].

## 2024-09-26 ENCOUNTER — Other Ambulatory Visit: Payer: Self-pay | Admitting: Obstetrics and Gynecology

## 2024-09-26 DIAGNOSIS — F32A Depression, unspecified: Secondary | ICD-10-CM

## 2024-09-26 NOTE — Telephone Encounter (Signed)
 Pt was supposed to come back in 7 wks for Rx f/u but she cancelled or no showed. Pls call her to get her scheduled with me. Let me know when she has appt and I'll send in lexapro  RF. She also wanted clonazepam  and needs that f/u appt on the schedule. Thx.

## 2024-09-26 NOTE — Telephone Encounter (Signed)
 Dr. Leigh will not manage Rx and suggested pt get PCP. Pt needs to come for f/u with me for lexapro  mgmt. Didn't come to scheduled 7 wk f/u 10/25.

## 2024-09-29 ENCOUNTER — Other Ambulatory Visit: Payer: Self-pay | Admitting: Obstetrics and Gynecology

## 2024-09-29 DIAGNOSIS — F32A Depression, unspecified: Secondary | ICD-10-CM

## 2024-09-29 MED ORDER — CLONAZEPAM 1 MG PO TABS: 1.0000 mg | ORAL_TABLET | Freq: Two times a day (BID) | ORAL | 0 refills | Status: DC | PRN

## 2024-09-29 NOTE — Progress Notes (Signed)
 Rx RF clonazepam  till f/u 10/01/24

## 2024-09-29 NOTE — Telephone Encounter (Signed)
 Rx RF, pt has appt

## 2024-10-01 ENCOUNTER — Encounter: Payer: Self-pay | Admitting: Obstetrics and Gynecology

## 2024-10-01 ENCOUNTER — Ambulatory Visit (INDEPENDENT_AMBULATORY_CARE_PROVIDER_SITE_OTHER): Admitting: Obstetrics and Gynecology

## 2024-10-01 VITALS — BP 106/72 | HR 72 | Ht 67.0 in | Wt 145.0 lb

## 2024-10-01 DIAGNOSIS — Z3202 Encounter for pregnancy test, result negative: Secondary | ICD-10-CM | POA: Diagnosis not present

## 2024-10-01 DIAGNOSIS — F32A Depression, unspecified: Secondary | ICD-10-CM

## 2024-10-01 DIAGNOSIS — N926 Irregular menstruation, unspecified: Secondary | ICD-10-CM

## 2024-10-01 DIAGNOSIS — F419 Anxiety disorder, unspecified: Secondary | ICD-10-CM | POA: Diagnosis not present

## 2024-10-01 LAB — POCT URINE PREGNANCY: Preg Test, Ur: NEGATIVE

## 2024-10-01 MED ORDER — ESCITALOPRAM OXALATE 20 MG PO TABS: 20.0000 mg | ORAL_TABLET | Freq: Every day | ORAL | 0 refills | Status: DC

## 2024-10-01 NOTE — Patient Instructions (Signed)
 I value your feedback and you entrusting [place] with your care. If you get a [facility] patient survey, I would appreciate you taking the time to let us know about your experience today. Thank you! ? ? ?

## 2024-10-01 NOTE — Progress Notes (Signed)
 Pcp, No   Chief Complaint  Patient presents with   Follow-up    Anxiety/depression. Would like UPT, stopped BC about a month ago.    HPI:      [name] is a 38 y.o. [phone] whose LMP was Patient's last menstrual period was 08/21/2024 (exact date)., presents today for anxiety/depression f/u from 9/25. Started on lexapro  10 mg at that time; has had decreased panic attacks/syncopal episodes the past month. Still has to take clonazepam  prn sleep. Not seeing therapist yet; trying to find one who takes her insurance. Is exercising, taking some Vit D supp.   Pt stopped POPs a month ago, no period yet. Not trying to conceive, considering IUD but has LEEP scheduled 10/28/24 and pt waiting to have that first before IUD inserted.   08/01/24 NOTE: Hx of anxiety/depression/PTSD; has had a hard several yrs with loss of fiance and other stressors; was on paxil  and lexapro  in past but didn't take it long enough; doesn't like being on meds. Did sertraline  100 mg for almost a year 2024 but stopped a few months ago. Unsure if it really helped but having increased panic attacks now, causing syncope and EMS is called. Had it 3 times in last few wks. Pt's GAD/PHQ was much improved when pt on sertraline . Pt likes to take 1/2 to 1 tab klonopin  daily as needed; usually takes 1/2 tab at bedtime to sleep because it helps her sleep, but she says it's hard to get off it. No success with unisom/melatonin/trazodone .  Has seen therapist in past but needs to find new one. No SI.   Patient Active Problem List   Diagnosis Date Noted   High grade squamous intraepithelial lesion (HGSIL), grade 3 CIN, on biopsy of cervix 09/25/2024   Herpes simplex vulvovaginitis 06/22/2023   Encounter for surveillance of contraceptive pills 12/09/2020   Anxiety and depression 05/05/2020   Herpes simplex 11/26/2014   History of brain disorder 09/04/2014   History of seizure 09/04/2014    Past Surgical History:  Procedure  Laterality Date   BREAST SURGERY      Family History  Problem Relation Age of Onset   Prostate cancer Father 39   Breast cancer Paternal Grandmother 54   Pancreatic cancer Maternal Aunt 9   Breast cancer Paternal Aunt 67    Social History   Socioeconomic History   Marital status: Single    Spouse name: Not on file   Number of children: Not on file   Years of education: Not on file   Highest education level: Not on file  Occupational History   Not on file  Tobacco Use   Smoking status: Never   Smokeless tobacco: Never  Vaping Use   Vaping status: Never Used  Substance and Sexual Activity   Alcohol use: Yes    Comment: occ   Drug use: No   Sexual activity: Yes    Birth control/protection: Pill  Other Topics Concern   Not on file  Social History Narrative   Not on file   Social Drivers of Health   Financial Resource Strain: Not on file  Food Insecurity: Not on file  Transportation Needs: Not on file  Physical Activity: Not on file  Stress: Not on file  Social Connections: Not on file  Intimate Partner Violence: Not on file    Outpatient Medications Prior to Visit  Medication Sig Dispense Refill   clonazePAM  (KLONOPIN ) 1 MG tablet Take 1 tablet (1 mg total)  by mouth 2 (two) times daily as needed. for anxiety 30 tablet 0   Multiple Vitamin (MULTIVITAMIN WITH MINERALS) TABS tablet Take 1 tablet by mouth daily.     ondansetron  (ZOFRAN -ODT) 4 MG disintegrating tablet Take 1 tablet (4 mg total) by mouth every 8 (eight) hours as needed for nausea or vomiting. 20 tablet 0   SUMAtriptan  (IMITREX ) 50 MG tablet TAKE 1 TABLET BY MOUTH AT ONSET OF MIGRAINE. MAY REPEAT IN 2 HOURS IF HEADACHE PERSISTS OR RECURS. 10 tablet 0   traZODone  (DESYREL ) 50 MG tablet Take 50 mg by mouth at bedtime as needed. for sleep     valACYclovir  (VALTREX ) 1000 MG tablet Take 1 tablet (1,000 mg total) by mouth daily. 90 tablet 3   escitalopram  (LEXAPRO ) 10 MG tablet Take 1 tab daily 30 tablet 0    meloxicam  (MOBIC ) 15 MG tablet TAKE 1 TABLET EVERY DAY BY ORAL ROUTE WITH MEAL(S). (Patient not taking: Reported on 10/01/2024)     norethindrone  (MICRONOR ) 0.35 MG tablet Take 1 tablet (0.35 mg total) by mouth daily. (Patient not taking: Reported on 10/01/2024) 84 tablet 0   tiZANidine  (ZANAFLEX ) 4 MG tablet Take 4 mg by mouth every 8 (eight) hours as needed. (Patient not taking: Reported on 10/01/2024)     gabapentin  (NEURONTIN ) 100 MG capsule Take 1 capsule (100 mg total) by mouth 3 (three) times daily for 10 days. (Patient not taking: Reported on 09/25/2024) 30 capsule 0   No facility-administered medications prior to visit.      ROS:  Review of Systems  Constitutional:  Negative for fever.  Gastrointestinal:  Negative for blood in stool, constipation, diarrhea, nausea and vomiting.  Genitourinary:  Negative for dyspareunia, dysuria, flank pain, frequency, hematuria, urgency, vaginal bleeding, vaginal discharge and vaginal pain.  Musculoskeletal:  Negative for back pain.  Skin:  Negative for rash.  Psychiatric/Behavioral:  Positive for agitation.     OBJECTIVE:   Vitals:  BP 106/72   Pulse 72   Ht 5' 7 (1.702 m)   Wt 145 lb (65.8 kg)   LMP 08/21/2024 (Exact Date)   BMI 22.71 kg/m   Physical Exam Vitals reviewed.  Constitutional:      Appearance: She is well-developed.  Pulmonary:     Effort: Pulmonary effort is normal.  Musculoskeletal:        General: Normal range of motion.     Cervical back: Normal range of motion.  Skin:    General: Skin is warm and dry.  Neurological:     General: No focal deficit present.     Mental Status: She is alert and oriented to person, place, and time.     Cranial Nerves: No cranial nerve deficit.  Psychiatric:        Mood and Affect: Mood normal.        Behavior: Behavior normal.        Thought Content: Thought content normal.        Judgment: Judgment normal.     Results: Results for orders placed or performed in visit on  10/01/24 (from the past 24 hours)  POCT urine pregnancy     Status: Normal   Collection Time: 10/01/24 10:58 AM  Result Value Ref Range   Preg Test, Ur Negative Negative      10/01/2024   10:59 AM 08/01/2024   10:55 AM 08/14/2023    9:45 AM 06/22/2023    9:57 AM  GAD 7 : Generalized Anxiety Score  Nervous, Anxious, on Edge 2  2 2 3   Control/stop worrying 2 3 2 3   Worry too much - different things 2 3 2 3   Trouble relaxing 1 3 2 3   Restless 1 2 2 3   Easily annoyed or irritable 1 1 2 3   Afraid - awful might happen 1 1 2 3   Total GAD 7 Score 10 15 14 21   Anxiety Difficulty Somewhat difficult Somewhat difficult  Somewhat difficult       10/01/2024   10:59 AM  Depression screen PHQ 2/9  Decreased Interest 1  PHQ - 2 Score 1  Altered sleeping 1  Tired, decreased energy 1  Change in appetite 2  Feeling bad or failure about yourself  2  Trouble concentrating 1  Moving slowly or fidgety/restless 1  Suicidal thoughts 0  PHQ-9 Score 9  Difficult doing work/chores Somewhat difficult   GAD=15, PHQ=16 9/25  Assessment/Plan: Anxiety and depression - Plan: escitalopram  (LEXAPRO ) 20 MG tablet; sx improved, doing better. Increase to lexapro  20 mg. Will have CMA call her in 4 wks for repeat GAD/PHQ. If ok, will RF Rx for the yr. Pt needs to find PCP to manage anxiety sx/Rx clonazepam  after my retirement so asked her to go ahead and schedule NP appt. Encouraged therapist, cont exercise/Vit D.   Late menses - Plan: POCT urine pregnancy; neg UPT. Discussed IUD placement and can be before LEEP. Pt to f/u prn.    Meds ordered this encounter  Medications   escitalopram  (LEXAPRO ) 20 MG tablet    Sig: Take 1 tablet (20 mg total) by mouth daily. Take 1 tab daily    Dispense:  30 tablet    Refill:  0    Supervising Provider:   LEIGH SOBER [[id number]]      Return if symptoms worsen or fail to improve.  Maryama Kuriakose B. Sejla Marzano, PA-C 10/01/2024 11:26 AM

## 2024-10-15 ENCOUNTER — Telehealth: Payer: Self-pay | Admitting: Obstetrics

## 2024-10-15 NOTE — Telephone Encounter (Signed)
 Patient called to reschedule leep procedure scheduled on 10/28/2024, Pt states she will be on a work trip that cannot be cancelled. Patient has agreed to have procedure on 11/04/2024.

## 2024-10-21 ENCOUNTER — Other Ambulatory Visit: Payer: Self-pay

## 2024-10-21 ENCOUNTER — Emergency Department (HOSPITAL_COMMUNITY)

## 2024-10-21 ENCOUNTER — Emergency Department (HOSPITAL_COMMUNITY)
Admission: EM | Admit: 2024-10-21 | Discharge: 2024-10-21 | Disposition: A | Discharge status: Home / Self Care | Attending: Emergency Medicine | Admitting: Emergency Medicine

## 2024-10-21 ENCOUNTER — Encounter (HOSPITAL_COMMUNITY): Payer: Self-pay

## 2024-10-21 DIAGNOSIS — Y906 Blood alcohol level of 120-199 mg/100 ml: Secondary | ICD-10-CM | POA: Insufficient documentation

## 2024-10-21 DIAGNOSIS — E870 Hyperosmolality and hypernatremia: Secondary | ICD-10-CM | POA: Diagnosis not present

## 2024-10-21 DIAGNOSIS — Z859 Personal history of malignant neoplasm, unspecified: Secondary | ICD-10-CM | POA: Insufficient documentation

## 2024-10-21 DIAGNOSIS — R569 Unspecified convulsions: Secondary | ICD-10-CM | POA: Insufficient documentation

## 2024-10-21 LAB — CBC
HCT: 36.5 % (ref 36.0–46.0)
Hemoglobin: 12.6 g/dL (ref 12.0–15.0)
MCH: 32.8 pg (ref 26.0–34.0)
MCHC: 34.5 g/dL (ref 30.0–36.0)
MCV: 95.1 fL (ref 80.0–100.0)
Platelets: 229 K/uL (ref 150–400)
RBC: 3.84 MIL/uL — ABNORMAL LOW (ref 3.87–5.11)
RDW: 12.3 % (ref 11.5–15.5)
WBC: 4.5 K/uL (ref 4.0–10.5)
nRBC: 0 % (ref 0.0–0.2)

## 2024-10-21 LAB — HCG, SERUM, QUALITATIVE: Preg, Serum: NEGATIVE

## 2024-10-21 LAB — BASIC METABOLIC PANEL WITH GFR
Anion gap: 17 — ABNORMAL HIGH (ref 5–15)
BUN: 15 mg/dL (ref 6–20)
CO2: 20 mmol/L — ABNORMAL LOW (ref 22–32)
Calcium: 8.8 mg/dL — ABNORMAL LOW (ref 8.9–10.3)
Chloride: 110 mmol/L (ref 98–111)
Creatinine, Ser: 0.56 mg/dL (ref 0.44–1.00)
GFR, Estimated: >60 mL/min (ref >60)
Glucose, Bld: 80 mg/dL (ref 70–99)
Potassium: 3.6 mmol/L (ref 3.5–5.1)
Sodium: 147 mmol/L — ABNORMAL HIGH (ref 135–145)

## 2024-10-21 LAB — CBG MONITORING, ED: Glucose-Capillary: 106 mg/dL — ABNORMAL HIGH (ref 70–99)

## 2024-10-21 LAB — ETHANOL: Alcohol, Ethyl (B): 188 mg/dL — ABNORMAL HIGH (ref <15)

## 2024-10-21 MED ORDER — SODIUM CHLORIDE 0.9 % IV BOLUS
1000.0000 mL | Freq: Once | INTRAVENOUS | Status: AC
  Administered 2024-10-21: 1000 mL via INTRAVENOUS

## 2024-10-21 NOTE — ED Notes (Signed)
 Patient transported to CT

## 2024-10-21 NOTE — ED Triage Notes (Addendum)
 Patient BIB EMS for evaluation after having 8 seizures lasting from 30 seconds to 1 minute each. Has hx of stress induced seizures. Patient not on medications for seizure management. EMS administered 5mg  midazolam. Arrives alert, able to follow commands and answer questions appropriately.

## 2024-10-21 NOTE — ED Provider Notes (Signed)
 [place] [facility] Provider Note   CSN: [id number] Arrival date & time: 10/21/24  [id number]     Patient presents with: Seizures   Latoya Luna is a 38 y.o. female with a history of anxiety, cancer, depression, pleurisy, high-grade squamous intraepithelial lesion, nonepileptic seizures.  Presents to ED for complaint of seizure-like activity.  Reports that this evening she was out with friends celebrating her upcoming birthday and drinking some alcohol.  Reports that at some point she must of passed out and began have a seizure because she reports the next thing remembers is being wheeled out to the ambulance.  Reports that she has been feeling fine today.  States that she did have a seizure yesterday where she fell and hit her head but she did not come to ED or be evaluated.  Husband at bedside reports that he received a call from her friends stating patient was having seizure.  They report that the patient was having 15 minutes of on and off seizure-like activity to include tonic-clonic for body shaking.  Husband reports that upon his arrival to patient, she was confused.  Patient denies that she bit her tongue, urinated on herself.  She is endorsing a headache which she attributes to recently hitting her head yesterday.  She has taken ibuprofen  for this today.  Reports that she has consumed EtOH today.  States she has drinking some champagne but denies any drug use.  Denies nausea, vomiting, abdominal pain.  LMP currently and consistent due to recently stopping birth control.  She denies blurred vision but does endorse that she is dizzy which she goes on to attribute to recently having her Lexapro  dosage increased.  Reports that she was advised by PCP that she would have some dizziness.  She reports that lying in the bed currently she has no dizziness.  Has no lightheadedness or neck pain.  Denies fevers.   Seizures      Prior to Admission medications    Medication Sig Start Date End Date Taking? Authorizing Provider  Ascorbic Acid (VITAMIN C PO) Take 1 tablet by mouth in the morning.    [provider]  Cholecalciferol (VITAMIN D -3 PO) Take by mouth.    [provider]  clonazePAM  (KLONOPIN ) 1 MG tablet Take 1 tablet (1 mg total) by mouth 2 (two) times daily as needed. for anxiety 09/29/24   Copland, Alicia B, PA-C  escitalopram  (LEXAPRO ) 20 MG tablet Take 1 tablet (20 mg total) by mouth daily. Take 1 tab daily Patient taking differently: Take 20 mg by mouth every evening. 10/01/24   Copland, Alicia B, PA-C  ibuprofen  (ADVIL ) 200 MG tablet Take 400 mg by mouth every 8 (eight) hours as needed (pain.).    [provider]  Multiple Vitamin (MULTIVITAMIN WITH MINERALS) TABS tablet Take 1 tablet by mouth in the morning.    [provider]  ondansetron  (ZOFRAN -ODT) 4 MG disintegrating tablet Take 1 tablet (4 mg total) by mouth every 8 (eight) hours as needed for nausea or vomiting. 02/12/24   Lynda Bradley, CNM  SUMAtriptan  (IMITREX ) 50 MG tablet TAKE 1 TABLET BY MOUTH AT ONSET OF MIGRAINE. MAY REPEAT IN 2 HOURS IF HEADACHE PERSISTS OR RECURS. 04/05/24   Gregg Lek, MD  valACYclovir  (VALTREX ) 1000 MG tablet Take 1 tablet (1,000 mg total) by mouth daily. 08/01/24   Copland, Alicia B, PA-C    Allergies: Patient has no known allergies.    Review of Systems  Neurological:  Positive for seizures.  All other systems reviewed and are negative.   Updated Vital Signs BP 93/64   Pulse 99   Temp 98.4 F (36.9 C) (Oral)   Resp 20   Ht 5' 7 (1.702 m)   Wt 65.8 kg   SpO2 99%   BMI 22.71 kg/m   Physical Exam Vitals and nursing note reviewed.  Constitutional:      General: She is not in acute distress.    Appearance: She is well-developed.  HENT:     Head: Normocephalic and atraumatic.  Eyes:     Conjunctiva/sclera: Conjunctivae normal.  Cardiovascular:     Rate and Rhythm: Normal rate and regular rhythm.      Heart sounds: No murmur heard. Pulmonary:     Effort: Pulmonary effort is normal. No respiratory distress.     Breath sounds: Normal breath sounds.  Abdominal:     Palpations: Abdomen is soft.     Tenderness: There is no abdominal tenderness.  Musculoskeletal:        General: No swelling.     Cervical back: Neck supple.  Skin:    General: Skin is warm and dry.     Capillary Refill: Capillary refill takes less than 2 seconds.  Neurological:     General: No focal deficit present.     Mental Status: She is alert and oriented to person, place, and time. Mental status is at baseline.     GCS: GCS eye subscore is 4. GCS verbal subscore is 5. GCS motor subscore is 6.     Cranial Nerves: Cranial nerves 2-12 are intact. No cranial nerve deficit.     Sensory: Sensation is intact. No sensory deficit.     Motor: Motor function is intact. No weakness.     Coordination: Coordination is intact. Heel to [facility] Test normal.     Comments: CN III through XII intact.  Intact finger-to-nose, heel-to-shin.  No pronator drift.  No slurred speech.  Equal strength throughout.  Equal sensation throughout.  PERRL.  Tracks cross midline.  Alert and oriented x 4.  Psychiatric:        Mood and Affect: Mood normal.     (all labs ordered are listed, but only abnormal results are displayed) Labs Reviewed  CBC - Abnormal; Notable for the following components:      Result Value   RBC 3.84 (*)    All other components within normal limits  BASIC METABOLIC PANEL WITH GFR - Abnormal; Notable for the following components:   Sodium 147 (*)    CO2 20 (*)    Calcium 8.8 (*)    Anion gap 17 (*)    All other components within normal limits  ETHANOL - Abnormal; Notable for the following components:   Alcohol, Ethyl (B) 188 (*)    All other components within normal limits  CBG MONITORING, ED - Abnormal; Notable for the following components:   Glucose-Capillary 106 (*)    All other components within normal limits  HCG,  SERUM, QUALITATIVE  RAPID URINE DRUG SCREEN, HOSP PERFORMED  URINALYSIS, ROUTINE W REFLEX MICROSCOPIC    EKG: None  Radiology: CT Head Wo Contrast Result Date: 10/21/2024 EXAM: CT HEAD WITHOUT CONTRAST 10/21/2024 03:48:00 AM TECHNIQUE: CT of the head was performed without the administration of intravenous contrast. Automated exposure control, iterative reconstruction, and/or weight based adjustment of the mA/kV was utilized to reduce the radiation dose to as low as reasonably achievable. COMPARISON: Head CT 03/13/2023, and earlier. CLINICAL  HISTORY: 38 year old female with with multiple seizures.  Moderate-severe head trauma. FINDINGS: BRAIN AND VENTRICLES: No acute hemorrhage. No evidence of acute infarct. No hydrocephalus. No extra-axial collection. No mass effect or midline shift. Brain volume is stable and normal for age. Free white differentiation is stable and normal. No suspicious intracranial vascular hyperdensity. ORBITS: No acute abnormality. SINUSES: Paranasal sinuses, middle ears, and mastoids are well aerated. SOFT TISSUES AND SKULL: No acute soft tissue abnormality. No skull fracture. IMPRESSION: 1. Stable and Normal non-contrast head CT. Electronically signed by: Helayne Hurst MD 10/21/2024 04:15 AM EST RP Workstation: [id number]    Procedures   Medications Ordered in the ED  sodium chloride  0.9 % bolus 1,000 mL (1,000 mLs Intravenous New Bag/Given 10/21/24 0526)     Medical Decision Making Amount and/or Complexity of Data Reviewed Labs: ordered. Radiology: ordered.   This is a 38 year old female with history of nonepileptic seizures presenting to the ED with concerns of seizure-like activity.  On arrival, she is alert and oriented x 4.  Afebrile and nontachycardic.  Lung sounds clear bilaterally, no hypoxia.  Abdomen soft and compressible.  Neurological examination is at baseline.  Overall nontoxic in appearance, speaking full sentences, alert and oriented.  Chart reviewed.   Patient recently had a EEG which showed no evidence of epilepsy.  Does not take any antiseizure medication.  Labs drawn.  CBC without leukocytosis or anemia.  Metabolic panel with sodium 147, anion gap 17.  1 L of fluid administered for this.  Ethanol elevated at 188.  Point-of-care CBG 106.  Patient defers urinalysis at this time.  Patient observed in department for 3 hours.  No return of seizure-like activity at this time.  Reports that she feels back to baseline.  Patient to be discharged at this time, advised to follow-up outpatient with neurology.  She voiced understanding.  Stable to discharge.    Final diagnoses:  Seizure-like activity [facility])    ED Discharge Orders     None          Ruthell Lonni JULIANNA DEVONNA 10/21/24 [id number]    Jerral Meth, MD 10/21/24 0630

## 2024-10-21 NOTE — Discharge Instructions (Signed)
 Please follow-up outpatient with your neurologist.  Return to the ED with new symptoms.

## 2024-10-22 ENCOUNTER — Encounter
Admission: RE | Admit: 2024-10-22 | Discharge: 2024-10-22 | Disposition: A | Discharge status: Home / Self Care | Source: Ambulatory Visit | Attending: Obstetrics | Admitting: Obstetrics

## 2024-10-22 ENCOUNTER — Other Ambulatory Visit: Payer: Self-pay

## 2024-10-22 DIAGNOSIS — Z01812 Encounter for preprocedural laboratory examination: Secondary | ICD-10-CM

## 2024-10-22 DIAGNOSIS — D069 Carcinoma in situ of cervix, unspecified: Secondary | ICD-10-CM

## 2024-10-22 HISTORY — DX: Anemia, unspecified: D64.9

## 2024-10-22 HISTORY — DX: Headache, unspecified: R51.9

## 2024-10-22 HISTORY — DX: Pneumonia, unspecified organism: J18.9

## 2024-10-22 NOTE — Patient Instructions (Addendum)
 Your procedure is scheduled on: 11/04/24 Report to the Registration Desk on the 1st floor of the [facility]. To find out your arrival time, please call [phone] between 1PM - 3PM on: 11/01/24  If your arrival time is 6:00 am, do not arrive before that time as the [facility] entrance doors do not open until 6:00 am.  REMEMBER: Instructions that are not followed completely may result in serious medical risk, up to and including death; or upon the discretion of your surgeon and anesthesiologist your surgery may need to be rescheduled.  Do not eat food or drink any liquids after midnight the night before surgery.  No gum chewing or hard candies.   One week prior to surgery: Stop Anti-inflammatories (NSAIDS) such as Advil , Aleve, Ibuprofen , Motrin , Naproxen, Naprosyn and Aspirin based products such as Excedrin, Goody's Powder, BC Powder. You may take Tylenol  if needed for pain up until the day of surgery.  Stop ANY OVER THE COUNTER supplements until after surgery : VITAMIN C ,VITAMIN D -3 ,Multiple Vitamin .  You may take Tylenol  if needed for pain up until the day of surgery.  ON THE DAY OF SURGERY ONLY TAKE THESE MEDICATIONS WITH SIPS OF WATER:  clonazePAM  (KLONOPIN ) if needed  No Alcohol for 24 hours before or after surgery.  No Smoking including e-cigarettes for 24 hours before surgery.  No chewable tobacco products for at least 6 hours before surgery.  No nicotine patches on the day of surgery.  Do not use any recreational drugs for at least a week (preferably 2 weeks) before your surgery.  Please be advised that the combination of cocaine and anesthesia may have negative outcomes, up to and including death. If you test positive for cocaine, your surgery will be cancelled.  On the morning of surgery brush your teeth with toothpaste and water, you may rinse your mouth with mouthwash if you wish. Do not swallow any toothpaste or mouthwash.  Use CHG Soap or wipes as  directed on instruction sheet.  Do not wear jewelry, make-up, hairpins, clips or nail polish.  For welded (permanent) jewelry: bracelets, anklets, waist bands, etc.  Please have this removed prior to surgery.  If it is not removed, there is a chance that hospital personnel will need to cut it off on the day of surgery.  Do not wear lotions, powders, or perfumes.   Do not shave body hair from the neck down 48 hours before surgery.  Contact lenses, hearing aids and dentures may not be worn into surgery.  Do not bring valuables to the hospital. [facility] is not responsible for any missing/lost belongings or valuables.   Notify your doctor if there is any change in your medical condition (cold, fever, infection).  Wear comfortable clothing (specific to your surgery type) to the hospital.  After surgery, you can help prevent lung complications by doing breathing exercises.  Take deep breaths and cough every 1-2 hours. Your doctor may order a device called an Incentive Spirometer to help you take deep breaths.  If you are being admitted to the hospital overnight, leave your suitcase in the car. After surgery it may be brought to your room.  In case of increased patient census, it may be necessary for you, the patient, to continue your postoperative care in the Same Day Surgery department.  If you are being discharged the day of surgery, you will not be allowed to drive home. You will need a responsible individual to drive you home and  stay with you for 24 hours after surgery.   If you are taking public transportation, you will need to have a responsible individual with you.  Please call the Pre-admissions Testing Dept. at [phone] if you have any questions about these instructions.  Surgery Visitation Policy:  Patients having surgery or a procedure may have two visitors.  Children under the age of 21 must have an adult with them who is not the patient.  Inpatient Visitation:     Visiting hours are 7 a.m. to 8 p.m. Up to four visitors are allowed at one time in a patient room. The visitors may rotate out with other people during the day.  One visitor age 57 or older may stay with the patient overnight and must be in the room by 8 p.m.   Merchandiser, Retail to address health-related social needs:  [email or web address]

## 2024-11-02 ENCOUNTER — Emergency Department (HOSPITAL_COMMUNITY)

## 2024-11-02 ENCOUNTER — Emergency Department (HOSPITAL_COMMUNITY): Admission: EM | Admit: 2024-11-02 | Discharge: 2024-11-03 | Disposition: A

## 2024-11-02 ENCOUNTER — Other Ambulatory Visit: Payer: Self-pay

## 2024-11-02 DIAGNOSIS — M545 Low back pain, unspecified: Secondary | ICD-10-CM | POA: Insufficient documentation

## 2024-11-02 DIAGNOSIS — R569 Unspecified convulsions: Secondary | ICD-10-CM | POA: Insufficient documentation

## 2024-11-02 DIAGNOSIS — Z8541 Personal history of malignant neoplasm of cervix uteri: Secondary | ICD-10-CM | POA: Insufficient documentation

## 2024-11-02 LAB — HCG, QUANTITATIVE, PREGNANCY: hCG, Beta Chain, Quant, S: <1 m[IU]/mL (ref <5)

## 2024-11-02 LAB — BASIC METABOLIC PANEL WITH GFR
Anion gap: 12 (ref 5–15)
BUN: 11 mg/dL (ref 6–20)
CO2: 21 mmol/L — ABNORMAL LOW (ref 22–32)
Calcium: 7.9 mg/dL — ABNORMAL LOW (ref 8.9–10.3)
Chloride: 110 mmol/L (ref 98–111)
Creatinine, Ser: 0.44 mg/dL (ref 0.44–1.00)
GFR, Estimated: >60 mL/min (ref >60)
Glucose, Bld: 86 mg/dL (ref 70–99)
Potassium: 3.2 mmol/L — ABNORMAL LOW (ref 3.5–5.1)
Sodium: 143 mmol/L (ref 135–145)

## 2024-11-02 LAB — CBC
HCT: 34.2 % — ABNORMAL LOW (ref 36.0–46.0)
Hemoglobin: 11.8 g/dL — ABNORMAL LOW (ref 12.0–15.0)
MCH: 33.1 pg (ref 26.0–34.0)
MCHC: 34.5 g/dL (ref 30.0–36.0)
MCV: 96.1 fL (ref 80.0–100.0)
Platelets: 213 K/uL (ref 150–400)
RBC: 3.56 MIL/uL — ABNORMAL LOW (ref 3.87–5.11)
RDW: 12.5 % (ref 11.5–15.5)
WBC: 4.4 K/uL (ref 4.0–10.5)
nRBC: 0 % (ref 0.0–0.2)

## 2024-11-02 LAB — D-DIMER, QUANTITATIVE: D-Dimer, Quant: 0.67 ug{FEU}/mL — ABNORMAL HIGH (ref 0.00–0.50)

## 2024-11-02 LAB — TROPONIN I (HIGH SENSITIVITY): Troponin I (High Sensitivity): <2 ng/L (ref <18)

## 2024-11-02 MED ORDER — IOHEXOL 350 MG/ML SOLN
75.0000 mL | Freq: Once | INTRAVENOUS | Status: AC | PRN
  Administered 2024-11-02: 75 mL via INTRAVENOUS

## 2024-11-02 MED ORDER — LACTATED RINGERS IV BOLUS
1000.0000 mL | Freq: Once | INTRAVENOUS | Status: AC
  Administered 2024-11-02: 1000 mL via INTRAVENOUS

## 2024-11-02 NOTE — ED Provider Notes (Signed)
  Provider Note MRN:  [id number]  Arrival date & time: 11/03/24    ED Course and Medical Decision Making  Assumed care of patient at sign-out or upon transfer.  History of nonepileptic seizures presenting with seizure, awaiting CT imaging to also evaluate for PE, cervical cancer mets  1:30 AM update: Patient is well-appearing on my exam, sleeping peacefully, wakes easily.  Soft blood pressure but maps above 65 and patient explains that her blood pressure is normally in the 80s to 90s systolic.  Has been stable during her time here.  Presenting with stress-induced seizure.  Reassuring workup with no signs of PE or other emergencies.  Discharged home.  Procedures  Final Clinical Impressions(s) / ED Diagnoses     ICD-10-CM   1. Seizure-like activity (HCC)  R56.9     2. Lumbar pain  M54.50       ED Discharge Orders     None         Discharge Instructions      Your workup today was reassuring.  Please follow-up with your doctor and return to the ER for worsening symptoms.    Ozell HERO. Theadore, MD [facility] Emergency Medicine [facility] [email or web address]    Theadore Ozell HERO, MD 11/03/24 [phone]

## 2024-11-02 NOTE — ED Triage Notes (Addendum)
 Pt BIB [facility]. Pts husband reports increased stress causing her to have a seizure at [employer]. Pt denies hitting head, husband lowered pt to the ground. Pt reports last dose of klonopin  Thursday afternoon. Pt reports recent lack of sleep due to recent traveling. On arrival to unit, pt A&Ox3.   EMS gave 5 mg midazolam  IM and 1L NS.

## 2024-11-02 NOTE — ED Notes (Signed)
 Patient transported to CT

## 2024-11-02 NOTE — ED Provider Notes (Signed)
 [place] EMERGENCY DEPARTMENT AT [facility] Provider Note   CSN: [id number] Arrival date & time: 11/02/24  2048     Patient presents with: Seizures   Latoya Luna is a 38 y.o. female.   38 year old female with past medical history of nonepileptic seizures presenting to the emergency department today after some seizure-like activity.  The patient states the last thing she remembers was waking up on the way to the ER.  This was apparently witnessed by her husband and she was helped to the ground did not have any head trauma.  She denies any fevers or chills.  She did just get back from [place].  Denies any chest pain or shortness of breath or history of pulmonary embolism or leg swelling.  She reports she is feeling back to her baseline currently.  She reports that she was recently diagnosed with cervical cancer and is scheduled for a LEEP in a few days.  Does not think that she has any metastasis that she is aware of.  She also reports that she has been having pain in her back as well as her right sacral area that is new since this most recent trip.  She states that she was told to stop anti-inflammatories prior to her surgery so is unsure if this may be the cause.  She denies any bowel or bladder dysfunction or saddle anesthesia.   Seizures      Prior to Admission medications   Medication Sig Start Date End Date Taking? Authorizing Provider  Ascorbic Acid (VITAMIN C PO) Take 1 tablet by mouth in the morning.    [provider]  Cholecalciferol (VITAMIN D -3 PO) Take by mouth.    [provider]  clonazePAM  (KLONOPIN ) 1 MG tablet Take 1 tablet (1 mg total) by mouth 2 (two) times daily as needed. for anxiety 09/29/24   Copland, Alicia B, PA-C  escitalopram  (LEXAPRO ) 20 MG tablet Take 1 tablet (20 mg total) by mouth daily. Take 1 tab daily Patient taking differently: Take 20 mg by mouth every evening. 10/01/24   Copland, Alicia B, PA-C  ibuprofen  (ADVIL ) 200 MG  tablet Take 400 mg by mouth every 8 (eight) hours as needed (pain.).    [provider]  Multiple Vitamin (MULTIVITAMIN WITH MINERALS) TABS tablet Take 1 tablet by mouth in the morning.    [provider]  ondansetron  (ZOFRAN -ODT) 4 MG disintegrating tablet Take 1 tablet (4 mg total) by mouth every 8 (eight) hours as needed for nausea or vomiting. 02/12/24   Lynda Bradley, CNM  SUMAtriptan  (IMITREX ) 50 MG tablet TAKE 1 TABLET BY MOUTH AT ONSET OF MIGRAINE. MAY REPEAT IN 2 HOURS IF HEADACHE PERSISTS OR RECURS. 04/05/24   Gregg Lek, MD  valACYclovir  (VALTREX ) 1000 MG tablet Take 1 tablet (1,000 mg total) by mouth daily. 08/01/24   Copland, Alicia B, PA-C    Allergies: Patient has no known allergies.    Review of Systems  Neurological:  Positive for seizures.  All other systems reviewed and are negative.   Updated Vital Signs BP 103/77   Pulse 75   Temp (!) 96.7 F (35.9 C) (Temporal)   Resp 17   Ht 5' 7 (1.702 m)   Wt 65.8 kg   SpO2 99%   BMI 22.72 kg/m   Physical Exam Vitals and nursing note reviewed.   Gen: NAD Eyes: PERRL, EOMI HEENT: no oropharyngeal swelling Neck: trachea midline Resp: clear to auscultation bilaterally Card: RRR, no murmurs, rubs, or  gallops Abd: nontender, nondistended Extremities: no calf tenderness, no edema MSK: tender over mid lumbar spine, R sacral tenderness with no gross deformity Vascular: 2+ radial pulses bilaterally, 2+ DP pulses bilaterally Neuro: Cranial nerves intact, equal strength sensation throughout bilateral upper and lower extremities with no dysmetria on finger-to-nose testing Skin: no rashes Psyc: acting appropriately   (all labs ordered are listed, but only abnormal results are displayed) Labs Reviewed  BASIC METABOLIC PANEL WITH GFR - Abnormal; Notable for the following components:      Result Value   Potassium 3.2 (*)    CO2 21 (*)    Calcium 7.9 (*)    All other components within normal limits  CBC -  Abnormal; Notable for the following components:   RBC 3.56 (*)    Hemoglobin 11.8 (*)    HCT 34.2 (*)    All other components within normal limits  D-DIMER, QUANTITATIVE - Abnormal; Notable for the following components:   D-Dimer, Quant 0.67 (*)    All other components within normal limits  HCG, QUANTITATIVE, PREGNANCY  TROPONIN I (HIGH SENSITIVITY)  TROPONIN I (HIGH SENSITIVITY)    EKG: None  Radiology: No results found.   Procedures   Medications Ordered in the ED  lactated ringers  bolus 1,000 mL (0 mLs Intravenous Stopped 11/02/24 2311)                                    Medical Decision Making 38 year old female with past medical history of recently diagnosed cervical cancer and nonepileptic seizures presenting to the emergency department today after questionable seizure.  Will obtain basic labs here to eval for anemia or electrolyte abnormalities.  Will obtain a CT scan of her lumbar spine and sacrum to eval for any metastatic lesions although looking through her workup it appears that the patient is scheduled for a LEEP and there is no known metastatic lesions.  Given her recent travel we will also obtain a D-dimer to evaluate for pulmonary embolism as she did have a loss of consciousness.  I will reevaluate for ultimate disposition.  Will give her IV fluids here.  The patient's work appears reassuring so far.  Her D-dimer is elevated so CT angiogram is ordered in addition to the CT scan of her lumbar spine and pelvis for further evaluation for metastatic lesions.  Plan is for likely discharge if her workup is unremarkable.  Amount and/or Complexity of Data Reviewed Labs: ordered. Radiology: ordered.        Final diagnoses:  Seizure-like activity (HCC)  Lumbar pain    ED Discharge Orders     None          Ula Prentice SAUNDERS, MD 11/02/24 2316

## 2024-11-02 NOTE — Discharge Instructions (Addendum)
 Your workup today was reassuring.  Please follow-up with your doctor and return to the ER for worsening symptoms.

## 2024-11-03 ENCOUNTER — Emergency Department
Admission: EM | Admit: 2024-11-03 | Discharge: 2024-11-03 | Disposition: A | Discharge status: Home / Self Care | Attending: Emergency Medicine | Admitting: Emergency Medicine

## 2024-11-03 ENCOUNTER — Emergency Department

## 2024-11-03 DIAGNOSIS — N3090 Cystitis, unspecified without hematuria: Secondary | ICD-10-CM | POA: Insufficient documentation

## 2024-11-03 DIAGNOSIS — M545 Low back pain, unspecified: Secondary | ICD-10-CM

## 2024-11-03 DIAGNOSIS — Z859 Personal history of malignant neoplasm, unspecified: Secondary | ICD-10-CM | POA: Insufficient documentation

## 2024-11-03 DIAGNOSIS — M5126 Other intervertebral disc displacement, lumbar region: Secondary | ICD-10-CM | POA: Insufficient documentation

## 2024-11-03 LAB — BASIC METABOLIC PANEL WITH GFR
Anion gap: 12 (ref 5–15)
BUN: 9 mg/dL (ref 6–20)
CO2: 25 mmol/L (ref 22–32)
Calcium: 8.6 mg/dL — ABNORMAL LOW (ref 8.9–10.3)
Chloride: 103 mmol/L (ref 98–111)
Creatinine, Ser: 0.56 mg/dL (ref 0.44–1.00)
GFR, Estimated: >60 mL/min (ref >60)
Glucose, Bld: 98 mg/dL (ref 70–99)
Potassium: 3.4 mmol/L — ABNORMAL LOW (ref 3.5–5.1)
Sodium: 140 mmol/L (ref 135–145)

## 2024-11-03 LAB — URINALYSIS, W/ REFLEX TO CULTURE (INFECTION SUSPECTED)
Bilirubin Urine: NEGATIVE
Glucose, UA: NEGATIVE mg/dL
Hgb urine dipstick: NEGATIVE
Ketones, ur: NEGATIVE mg/dL
Nitrite: POSITIVE — AB
Protein, ur: NEGATIVE mg/dL
Specific Gravity, Urine: 1.011 (ref 1.005–1.030)
pH: 8 (ref 5.0–8.0)

## 2024-11-03 LAB — CBC
HCT: 35.4 % — ABNORMAL LOW (ref 36.0–46.0)
Hemoglobin: 12.1 g/dL (ref 12.0–15.0)
MCH: 32.6 pg (ref 26.0–34.0)
MCHC: 34.2 g/dL (ref 30.0–36.0)
MCV: 95.4 fL (ref 80.0–100.0)
Platelets: 247 K/uL (ref 150–400)
RBC: 3.71 MIL/uL — ABNORMAL LOW (ref 3.87–5.11)
RDW: 12.5 % (ref 11.5–15.5)
WBC: 5.6 K/uL (ref 4.0–10.5)
nRBC: 0 % (ref 0.0–0.2)

## 2024-11-03 LAB — TROPONIN T, HIGH SENSITIVITY
Troponin T High Sensitivity: <15 ng/L (ref 0–19)
Troponin T High Sensitivity: <15 ng/L (ref 0–19)

## 2024-11-03 MED ORDER — OXYCODONE-ACETAMINOPHEN 5-325 MG PO TABS
1.0000 | ORAL_TABLET | Freq: Once | ORAL | Status: AC
  Administered 2024-11-03: 1 via ORAL
  Filled 2024-11-03: qty 1

## 2024-11-03 MED ORDER — HYDROMORPHONE HCL 1 MG/ML IJ SOLN
1.0000 mg | Freq: Once | INTRAMUSCULAR | Status: AC
  Administered 2024-11-03: 1 mg via INTRAVENOUS
  Filled 2024-11-03: qty 1

## 2024-11-03 MED ORDER — ACETAMINOPHEN 500 MG PO TABS: 1000.0000 mg | ORAL_TABLET | Freq: Once | ORAL | Status: DC

## 2024-11-03 MED ORDER — CEPHALEXIN 500 MG PO CAPS
500.0000 mg | ORAL_CAPSULE | Freq: Once | ORAL | Status: AC
  Administered 2024-11-03: 500 mg via ORAL
  Filled 2024-11-03: qty 1

## 2024-11-03 MED ORDER — CEPHALEXIN 500 MG PO CAPS: 500.0000 mg | ORAL_CAPSULE | Freq: Three times a day (TID) | ORAL | 0 refills | Status: AC

## 2024-11-03 MED ORDER — CELECOXIB 200 MG PO CAPS: 200.0000 mg | ORAL_CAPSULE | Freq: Once | ORAL | Status: DC

## 2024-11-03 MED ORDER — ORAL CARE MOUTH RINSE: 15.0000 mL | Freq: Once | OROMUCOSAL | Status: DC

## 2024-11-03 MED ORDER — LACTATED RINGERS IV SOLN: INTRAVENOUS | Status: DC

## 2024-11-03 MED ORDER — CHLORHEXIDINE GLUCONATE 0.12 % MT SOLN: 15.0000 mL | Freq: Once | OROMUCOSAL | Status: DC

## 2024-11-03 MED ORDER — MORPHINE SULFATE (PF) 4 MG/ML IV SOLN
4.0000 mg | Freq: Once | INTRAVENOUS | Status: AC
  Administered 2024-11-03: 4 mg via INTRAVENOUS
  Filled 2024-11-03: qty 1

## 2024-11-03 MED ORDER — SODIUM CHLORIDE 0.9 % IV BOLUS
500.0000 mL | Freq: Once | INTRAVENOUS | Status: AC
  Administered 2024-11-03: 500 mL via INTRAVENOUS

## 2024-11-03 MED ORDER — OXYCODONE-ACETAMINOPHEN 5-325 MG PO TABS: 1.0000 | ORAL_TABLET | Freq: Four times a day (QID) | ORAL | 0 refills | Status: DC | PRN

## 2024-11-03 NOTE — Anesthesia Preprocedure Evaluation (Signed)
 Anesthesia Evaluation  Patient identified by MRN, date of birth, ID band Patient awake    Reviewed: Allergy & Precautions, H&P , NPO status , Patient's Chart, lab work & pertinent test results  Airway Mallampati: II  TM Distance: >3 FB Neck ROM: full    Dental no notable dental hx.    Pulmonary neg pulmonary ROS   Pulmonary exam normal        Cardiovascular Normal cardiovascular exam  2022 history- history of vasovagal syncope, with stress-induced presyncope, with negative work-up including chest CT at [facility] ED. 72-hour Holter monitor revealed predominant sinus rhythm with rare premature ventricular contraction   Neuro/Psych Seizures -, Well Controlled,  PSYCHIATRIC DISORDERS Anxiety      Neuromuscular disease (chronic back pain)    GI/Hepatic negative GI ROS, Neg liver ROS,,,  Endo/Other  negative endocrine ROS    Renal/GU negative Renal ROS  negative genitourinary   Musculoskeletal   Abdominal   Peds  Hematology negative hematology ROS (+)   Anesthesia Other Findings Pt with visit to ED yesterday for acute on chronic back pain. MRI below. There was quick resolution of pain. No motor or sensory defecits were noted. Pt walked today and can flex hips and extend knees with no pain. Thee UA consistent with a UTI of which treatment has started.   MRI Lumbar: IMPRESSION: 1. Degenerative disc disease at L5-S1. Mild bilateral foraminal stenosis at this level. No significant canal stenosis   Past Medical History: No date: Anemia No date: Anxiety 02/2021: BRCA negative     Comment:  MyRisk neg No date: Cancer [facility])     Comment:  cervical cells No date: Depression No date: Family history of breast cancer No date: Headache 02/2021: Increased risk of breast cancer     Comment:  IBIS=23%/riskscore=33% No date: Pleurisy No date: Pneumonia No date: Seizures (HCC)     Comment:  last seizure 5 years ago  Past Surgical  History: No date: BREAST SURGERY     Reproductive/Obstetrics negative OB ROS                              Anesthesia Physical Anesthesia Plan  ASA: 2  Anesthesia Plan: General   Post-op Pain Management: Tylenol  PO (pre-op)* and Celebrex  PO (pre-op)*   Induction: Intravenous  PONV Risk Score and Plan: Propofol  infusion, TIVA, Ondansetron  and Dexamethasone   Airway Management Planned: LMA  Additional Equipment:   Intra-op Plan:   Post-operative Plan: Extubation in OR  Informed Consent: I have reviewed the patients History and Physical, chart, labs and discussed the procedure including the risks, benefits and alternatives for the proposed anesthesia with the patient or authorized representative who has indicated his/her understanding and acceptance.     Dental Advisory Given  Plan Discussed with: CRNA and Surgeon  Anesthesia Plan Comments:          Anesthesia Quick Evaluation

## 2024-11-03 NOTE — ED Notes (Signed)
 Pt BP cuff adjusted, RN notified of pt most recent BP.

## 2024-11-03 NOTE — ED Triage Notes (Signed)
 Pt comes via EMS from [employer] with c/o seizure. Pt was A*OX4 per EMS the patient states this happens with stress. Pt states she stood up and passed out. Husband says she was shaking all over.   Pt hasn't taken any meds bc doc said to stop them. Pt having surgery tomorrow for herniated disc. Pt also back from [place] yesterday. Pt states back issues. 20 g in RAC  CBG 150 VSS

## 2024-11-03 NOTE — ED Provider Notes (Signed)
 [facility] Provider Note    Event Date/Time   First MD Initiated Contact with Patient 11/03/24 1742     (approximate)   History   Chief Complaint: Seizures   HPI  Latoya Luna is a 38 y.o. female with a history of anxiety, PN ES who comes ED due to syncope which is attributed to stress.  She is anticipating a LEEP procedure tomorrow.  Because of that she has had to avoid NSAIDs for back pain and so now complains of worsened back pain.  Denies any focal paresthesia or motor weakness.  No bowel or bladder incontinence or retention.  Ambulatory.  Reports pain is much more severe than she is used to.        Past Medical History:  Diagnosis Date   Anemia    Anxiety    BRCA negative 02/2021   MyRisk neg   Cancer (HCC)    cervical cells   Depression    Family history of breast cancer    Headache    Increased risk of breast cancer 02/2021   IBIS=23%/riskscore=33%   Pleurisy    Pneumonia    Seizures (HCC)    last seizure 5 years ago    Current Outpatient Rx   Order #: [id number] Class: Normal   Order #: [id number] Class: Normal   Order #: [id number] Class: Historical Med   Order #: [id number] Class: Historical Med   Order #: [id number] Class: Normal   Order #: [id number] Class: Normal   Order #: [id number] Class: Historical Med   Order #: [id number] Class: Historical Med   Order #: [id number] Class: Normal   Order #: [id number] Class: Normal   Order #: [id number] Class: Normal    Past Surgical History:  Procedure Laterality Date   BREAST SURGERY      Physical Exam   Triage Vital Signs: ED Triage Vitals  Encounter Vitals Group     BP 11/03/24 1433 116/86     Girls Systolic BP Percentile --      Girls Diastolic BP Percentile --      Boys Systolic BP Percentile --      Boys Diastolic BP Percentile --      Pulse Rate 11/03/24 1433 63     Resp 11/03/24 1433 19     Temp 11/03/24 1433 97.9 F (36.6 C)     Temp src --      SpO2 11/03/24 1433 100 %      Weight --      Height --      Head Circumference --      Peak Flow --      Pain Score 11/03/24 1740 10     Pain Loc --      Pain Education --      Exclude from Growth Chart --     Most recent vital signs: Vitals:   11/03/24 1433 11/03/24 1958  BP: 116/86 114/74  Pulse: 63 70  Resp: 19 18  Temp: 97.9 F (36.6 C) 98.1 F (36.7 C)  SpO2: 100% 100%    General: Awake, no distress.  CV:  Good peripheral perfusion.  Regular rate rhythm, normal DP pulses Resp:  Normal effort.  Abd:  No distention.  Soft nontender Other:  Normal motor function bilateral lower extremities, EHL intact.  Normal patellar reflexes bilaterally.   ED Results / Procedures / Treatments   Labs (all labs ordered are listed, but only abnormal results are displayed) Labs Reviewed  CBC - Abnormal; Notable for the following components:  Result Value   RBC 3.71 (*)    HCT 35.4 (*)    All other components within normal limits  BASIC METABOLIC PANEL WITH GFR - Abnormal; Notable for the following components:   Potassium 3.4 (*)    Calcium 8.6 (*)    All other components within normal limits  URINALYSIS, W/ REFLEX TO CULTURE (INFECTION SUSPECTED) - Abnormal; Notable for the following components:   Color, Urine YELLOW (*)    APPearance CLOUDY (*)    Nitrite POSITIVE (*)    Leukocytes,Ua LARGE (*)    Bacteria, UA RARE (*)    All other components within normal limits  PREGNANCY, URINE  TROPONIN T, HIGH SENSITIVITY  TROPONIN T, HIGH SENSITIVITY     EKG    RADIOLOGY MRI lumbar spine interpreted by me, no obvious spinal cord impingement.  Radiology report reviewed   PROCEDURES:  Procedures   MEDICATIONS ORDERED IN ED: Medications  oxyCODONE -acetaminophen  (PERCOCET/ROXICET) 5-325 MG per tablet 1 tablet (has no administration in time range)  cephALEXin  (KEFLEX ) capsule 500 mg (has no administration in time range)  morphine  (PF) 4 MG/ML injection 4 mg (4 mg Intravenous Given 11/03/24 1817)   HYDROmorphone  (DILAUDID ) injection 1 mg (1 mg Intravenous Given 11/03/24 1947)  sodium chloride  0.9 % bolus 500 mL (500 mLs Intravenous New Bag/Given 11/03/24 1947)     IMPRESSION / MDM / ASSESSMENT AND PLAN / ED COURSE  I reviewed the triage vital signs and the nursing notes.  DDx: Symptomatic herniated lumbar disc, lumbar compression fracture, vertebral osteomyelitis/discitis, cauda equina syndrome, chronic pain, anxiety, UTI  Patient's presentation is most consistent with acute presentation with potential threat to life or bodily function.  Patient presents with acute on chronic low back pain, rated as severe.  Vital signs unremarkable, neurologically intact.  She was seen at [facility], ED yesterday.  In consideration of her repeat presentation and anxiety, I think MRI is necessary.  Will give IV morphine  for pain relief serum labs and urine.   ----------------------------------------- 9:43 PM on 11/03/2024 ----------------------------------------- MRI reassuring.  UA consistent with a UTI.  Will start on Keflex , sent home with a limited prescription for Percocet since.  This did help with her pain and she needs to avoid NSAIDs for now and advance of procedure tomorrow.  She will follow-up with her PCP who has already been talking to her about referral to a spine specialist..      FINAL CLINICAL IMPRESSION(S) / ED DIAGNOSES   Final diagnoses:  Acute on chronic low back pain  Cystitis  Herniated lumbar intervertebral disc     Rx / DC Orders   ED Discharge Orders          Ordered    oxyCODONE -acetaminophen  (PERCOCET) 5-325 MG tablet  Every 6 hours PRN        11/03/24 2142    cephALEXin  (KEFLEX ) 500 MG capsule  3 times daily        11/03/24 2142             Note:  This document was prepared using Dragon voice recognition software and may include unintentional dictation errors.   Viviann Pastor, MD 11/03/24 2144

## 2024-11-04 ENCOUNTER — Encounter: Admission: RE | Disposition: A | Payer: Self-pay | Attending: Obstetrics

## 2024-11-04 ENCOUNTER — Ambulatory Visit: Payer: Self-pay | Admitting: Anesthesiology

## 2024-11-04 ENCOUNTER — Ambulatory Visit

## 2024-11-04 ENCOUNTER — Encounter: Payer: Self-pay | Admitting: Obstetrics

## 2024-11-04 ENCOUNTER — Other Ambulatory Visit: Payer: Self-pay

## 2024-11-04 ENCOUNTER — Encounter: Payer: Self-pay | Admitting: Anesthesiology

## 2024-11-04 ENCOUNTER — Ambulatory Visit
Admission: RE | Admit: 2024-11-04 | Discharge: 2024-11-04 | Disposition: A | Attending: Obstetrics | Admitting: Obstetrics

## 2024-11-04 DIAGNOSIS — D069 Carcinoma in situ of cervix, unspecified: Secondary | ICD-10-CM | POA: Diagnosis present

## 2024-11-04 DIAGNOSIS — Z01812 Encounter for preprocedural laboratory examination: Secondary | ICD-10-CM

## 2024-11-04 HISTORY — PX: LEEP: SHX91

## 2024-11-04 LAB — PREGNANCY, URINE: Preg Test, Ur: NEGATIVE

## 2024-11-04 SURGERY — LEEP (LOOP ELECTROSURGICAL EXCISION PROCEDURE)
Anesthesia: General | Site: Cervix

## 2024-11-04 MED ORDER — MIDAZOLAM HCL 2 MG/2ML IJ SOLN
INTRAMUSCULAR | Status: AC
  Filled 2024-11-04: qty 2

## 2024-11-04 MED ORDER — DEXMEDETOMIDINE HCL IN NACL 200 MCG/50ML IV SOLN
INTRAVENOUS | Status: DC | PRN
  Administered 2024-11-04 (×2): 4 ug via INTRAVENOUS

## 2024-11-04 MED ORDER — ACETAMINOPHEN 10 MG/ML IV SOLN
INTRAVENOUS | Status: DC | PRN
  Administered 2024-11-04: 1000 mg via INTRAVENOUS

## 2024-11-04 MED ORDER — KETOROLAC TROMETHAMINE 30 MG/ML IJ SOLN
INTRAMUSCULAR | Status: AC
  Filled 2024-11-04: qty 1

## 2024-11-04 MED ORDER — ONDANSETRON HCL 4 MG/2ML IJ SOLN
INTRAMUSCULAR | Status: DC | PRN
  Administered 2024-11-04: 4 mg via INTRAVENOUS

## 2024-11-04 MED ORDER — IODINE STRONG (LUGOLS) 5 % PO SOLN
ORAL | Status: DC | PRN
  Administered 2024-11-04: .1 mL via ORAL

## 2024-11-04 MED ORDER — LIDOCAINE HCL (CARDIAC) PF 100 MG/5ML IV SOSY
PREFILLED_SYRINGE | INTRAVENOUS | Status: DC | PRN
  Administered 2024-11-04: 60 mg via INTRAVENOUS

## 2024-11-04 MED ORDER — ACETAMINOPHEN 10 MG/ML IV SOLN: 1000.0000 mg | Freq: Once | INTRAVENOUS | Status: DC | PRN

## 2024-11-04 MED ORDER — PROPOFOL 10 MG/ML IV BOLUS
INTRAVENOUS | Status: DC | PRN
  Administered 2024-11-04: 50 mg via INTRAVENOUS
  Administered 2024-11-04: 180 mg via INTRAVENOUS

## 2024-11-04 MED ORDER — LIDOCAINE HCL (PF) 1 % IJ SOLN
INTRAMUSCULAR | Status: AC
  Filled 2024-11-04: qty 30

## 2024-11-04 MED ORDER — FENTANYL CITRATE (PF) 100 MCG/2ML IJ SOLN
INTRAMUSCULAR | Status: AC
  Filled 2024-11-04: qty 2

## 2024-11-04 MED ORDER — OXYCODONE-ACETAMINOPHEN 5-325 MG PO TABS: 1.0000 | ORAL_TABLET | Freq: Three times a day (TID) | ORAL | 0 refills | Status: AC | PRN

## 2024-11-04 MED ORDER — PHENYLEPHRINE 80 MCG/ML (10ML) SYRINGE FOR IV PUSH (FOR BLOOD PRESSURE SUPPORT)
PREFILLED_SYRINGE | INTRAVENOUS | Status: DC | PRN
  Administered 2024-11-04 (×2): 40 ug via INTRAVENOUS

## 2024-11-04 MED ORDER — 0.9 % SODIUM CHLORIDE (POUR BTL) OPTIME
TOPICAL | Status: DC | PRN
  Administered 2024-11-04: 500 mL

## 2024-11-04 MED ORDER — OXYCODONE HCL 5 MG PO TABS: 5.0000 mg | ORAL_TABLET | Freq: Once | ORAL | Status: DC | PRN

## 2024-11-04 MED ORDER — MIDAZOLAM HCL (PF) 2 MG/2ML IJ SOLN
INTRAMUSCULAR | Status: DC | PRN
  Administered 2024-11-04: 2 mg via INTRAVENOUS

## 2024-11-04 MED ORDER — GLYCOPYRROLATE 0.2 MG/ML IJ SOLN
INTRAMUSCULAR | Status: DC | PRN
  Administered 2024-11-04: .2 mg via INTRAVENOUS

## 2024-11-04 MED ORDER — LIDOCAINE-EPINEPHRINE 1 %-1:100000 IJ SOLN
INTRAMUSCULAR | Status: AC
  Filled 2024-11-04: qty 1

## 2024-11-04 MED ORDER — OXYCODONE HCL 5 MG/5ML PO SOLN: 5.0000 mg | Freq: Once | ORAL | Status: DC | PRN

## 2024-11-04 MED ORDER — IODINE STRONG (LUGOLS) 5 % PO SOLN
ORAL | Status: AC
  Filled 2024-11-04: qty 1

## 2024-11-04 MED ORDER — PROPOFOL 1000 MG/100ML IV EMUL
INTRAVENOUS | Status: AC
  Filled 2024-11-04: qty 100

## 2024-11-04 MED ORDER — MIDAZOLAM HCL (PF) 2 MG/2ML IJ SOLN
1.0000 mg | Freq: Once | INTRAMUSCULAR | Status: DC | PRN
  Administered 2024-11-04: 1 mg via INTRAVENOUS

## 2024-11-04 MED ORDER — KETOROLAC TROMETHAMINE 30 MG/ML IJ SOLN
INTRAMUSCULAR | Status: DC | PRN
  Administered 2024-11-04: 30 mg via INTRAVENOUS

## 2024-11-04 MED ORDER — FENTANYL CITRATE (PF) 100 MCG/2ML IJ SOLN
INTRAMUSCULAR | Status: DC | PRN
  Administered 2024-11-04 (×2): 25 ug via INTRAVENOUS
  Administered 2024-11-04: 50 ug via INTRAVENOUS

## 2024-11-04 MED ORDER — PROPOFOL 10 MG/ML IV BOLUS
INTRAVENOUS | Status: AC
  Filled 2024-11-04: qty 20

## 2024-11-04 MED ORDER — ACETAMINOPHEN 10 MG/ML IV SOLN
INTRAVENOUS | Status: AC
  Filled 2024-11-04: qty 100

## 2024-11-04 MED ORDER — LIDOCAINE-EPINEPHRINE 1 %-1:100000 IJ SOLN
INTRAMUSCULAR | Status: DC | PRN
  Administered 2024-11-04: 20 mL

## 2024-11-04 MED ORDER — FENTANYL CITRATE (PF) 100 MCG/2ML IJ SOLN: 25.0000 ug | INTRAMUSCULAR | Status: DC | PRN

## 2024-11-04 MED ORDER — DEXAMETHASONE SOD PHOSPHATE PF 10 MG/ML IJ SOLN
INTRAMUSCULAR | Status: DC | PRN
  Administered 2024-11-04: 10 mg via INTRAVENOUS

## 2024-11-04 MED ORDER — PROPOFOL 500 MG/50ML IV EMUL
INTRAVENOUS | Status: DC | PRN
  Administered 2024-11-04: 125 ug/kg/min via INTRAVENOUS

## 2024-11-04 MED ORDER — DROPERIDOL 2.5 MG/ML IJ SOLN: 0.6250 mg | Freq: Once | INTRAMUSCULAR | Status: DC | PRN

## 2024-11-04 MED ORDER — IBUPROFEN 800 MG PO TABS: 800.0000 mg | ORAL_TABLET | Freq: Three times a day (TID) | ORAL | 0 refills | Status: AC | PRN

## 2024-11-04 MED ORDER — LACTATED RINGERS IV SOLN: INTRAVENOUS | Status: DC | PRN

## 2024-11-04 SURGICAL SUPPLY — 34 items
APPLICATOR COTTON TIP 6 STRL (MISCELLANEOUS) ×1 IMPLANT
APPLICATOR SWAB PROCTO LG 16IN (MISCELLANEOUS) ×1 IMPLANT
CNTNR URN SCR LID CUP LEK RST (MISCELLANEOUS) IMPLANT
COUNTER NDL MAGNETIC 40 RED (SET/KITS/TRAYS/PACK) IMPLANT
DRAPE UNDER BUTTOCK W/FLU (DRAPES) ×1 IMPLANT
DRSG TELFA 3X8 NADH STRL (GAUZE/BANDAGES/DRESSINGS) ×1 IMPLANT
ELECTRODE LEEP BALL 5MM 12CM (MISCELLANEOUS) IMPLANT
ELECTRODE LEEP LOOP 1.0CM .7CM (MISCELLANEOUS) ×1 IMPLANT
ELECTRODE REM PT RTRN 9FT ADLT (ELECTROSURGICAL) ×1 IMPLANT
EXCISOR BIOPSY CONE MED FISHER (MISCELLANEOUS) ×1 IMPLANT
EXCISOR BIOPSY FISCHER CONE LG (MISCELLANEOUS) ×1 IMPLANT
EXCISOR FISCHER CONE BIOPSY SM (MISCELLANEOUS) ×1 IMPLANT
GLOVE BIO SURGEON STRL SZ 6 (GLOVE) ×1 IMPLANT
GLOVE BIOGEL PI IND STRL 6 (GLOVE) ×1 IMPLANT
GOWN STRL REUS W/ TWL LRG LVL3 (GOWN DISPOSABLE) ×2 IMPLANT
HANDLE YANKAUER SUCT BULB TIP (MISCELLANEOUS) ×1 IMPLANT
KIT TURNOVER CYSTO (KITS) ×1 IMPLANT
LABEL OR SOLS (LABEL) ×1 IMPLANT
MANIFOLD NEPTUNE II (INSTRUMENTS) ×1 IMPLANT
NDL SPNL 20GX3.5 QUINCKE YW (NEEDLE) IMPLANT
NDL SPNL 22GX3.5 QUINCKE BK (NEEDLE) ×1 IMPLANT
NS IRRIG 500ML POUR BTL (IV SOLUTION) IMPLANT
PACK DNC HYST (MISCELLANEOUS) ×1 IMPLANT
PAD PREP OB/GYN DISP 24X41 (PERSONAL CARE ITEMS) ×1 IMPLANT
PENCIL SMOKE EVACUATOR (MISCELLANEOUS) ×1 IMPLANT
SCOPETTES 8 STERILE (MISCELLANEOUS) IMPLANT
SCRUB CHG 4% DYNA-HEX 4OZ (MISCELLANEOUS) ×1 IMPLANT
SET CYSTO IRRIGATION (SET/KITS/TRAYS/PACK) IMPLANT
SOLN STERILE WATER 500 ML (IV SOLUTION) ×1 IMPLANT
STRAW SMOKE EVAC LEEP 6150 NON (MISCELLANEOUS) ×1 IMPLANT
SUT SILK 2 0 SH (SUTURE) ×1 IMPLANT
SYR 10ML LL (SYRINGE) ×1 IMPLANT
SYR CONTROL 10ML LL (SYRINGE) IMPLANT
TRAP FLUID SMOKE EVACUATOR (MISCELLANEOUS) ×1 IMPLANT

## 2024-11-04 NOTE — Transfer of Care (Signed)
 Immediate Anesthesia Transfer of Care Note  Patient: Latoya Luna  Procedure(s) Performed: LEEP (LOOP ELECTROSURGICAL EXCISION PROCEDURE) (Cervix)  Patient Location: PACU  Anesthesia Type:General  Level of Consciousness: awake and alert   Airway & Oxygen Therapy: Patient Spontanous Breathing and Patient connected to face mask oxygen  Post-op Assessment: Report given to RN and Post -op Vital signs reviewed and stable  Post vital signs: Reviewed and stable  Last Vitals:  Vitals Value Taken Time  BP 99/62 11/04/24 14:36  Temp 36.3 C 11/04/24 14:35  Pulse 74 11/04/24 14:38  Resp 20 11/04/24 14:38  SpO2 100 % 11/04/24 14:38  Vitals shown include unfiled device data.  Last Pain:  Vitals:   11/04/24 1435  TempSrc:   PainSc: Asleep         Complications: No notable events documented.

## 2024-11-04 NOTE — H&P (Signed)
 GYNECOLOGY PREOPERATIVE HISTORY AND PHYSICAL   Subjective:  Latoya Luna is a 38 y.o. [id number] here for surgical management of CIN-3/CIN2.   Indications for procedure include: pt declines in-office procedure, desires sedation. No significant preoperative concerns.  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    Past Medical History:  Diagnosis Date   Anemia    Anxiety    BRCA negative 02/2021   MyRisk neg   Cancer (HCC)    cervical cells   Depression    Family history of breast cancer    Headache    Increased risk of breast cancer 02/2021   IBIS=23%/riskscore=33%   Pleurisy    Pneumonia    Seizures (HCC)    last seizure 5 years ago   Past Surgical History:  Procedure Laterality Date   BREAST SURGERY     OB History  Gravida Para Term Preterm AB Living  3 3 3  0 0 3  SAB IAB Ectopic Multiple Live Births  0 0 0  3    # Outcome Date GA Lbr Len/2nd Weight Sex Type Anes PTL Lv  3 Term 04/09/15 [redacted]w[redacted]d  3416 g F Vag-Spont   LIV  2 Term 12/04/12   3232 g M Vag-Spont   LIV  1 Term 01/30/10   3062 g F Vag-Spont   LIV  Patient denies any other pertinent gynecologic issues.  Family History  Problem Relation Age of Onset   Prostate cancer Father 96   Breast cancer Paternal Grandmother 42   Pancreatic cancer Maternal Aunt 4   Breast cancer Paternal Aunt 89   Social History   Socioeconomic History   Marital status: Married    Spouse name: Elodia Mt   Number of children: 3   Years of education: Not on file   Highest education level: Not on file  Occupational History   Not on file  Tobacco Use   Smoking status: Never   Smokeless tobacco: Never  Vaping Use   Vaping status: Never Used  Substance and Sexual Activity    Alcohol use: Yes    Comment: occ   Drug use: No   Sexual activity: Yes    Birth control/protection: Pill  Other Topics Concern   Not on file  Social History Narrative   Not on file   Social Drivers of Health   Financial Resource Strain: Not on file  Food Insecurity: Not on file  Transportation Needs: Not on file  Physical Activity: Not on file  Stress: Not on file  Social Connections: Not on file  Intimate Partner Violence: Not on file   No current facility-administered medications on file prior to encounter.   Current Outpatient Medications on File Prior to Encounter  Medication Sig Dispense Refill   Ascorbic Acid (VITAMIN C PO) Take 1 tablet by mouth in the morning.     Cholecalciferol (VITAMIN D -3 PO) Take by mouth.     clonazePAM  (KLONOPIN ) 1 MG tablet Take 1 tablet (1 mg total) by mouth 2 (two) times daily as needed. for anxiety 30 tablet 0   ibuprofen  (ADVIL ) 200 MG tablet Take 400 mg by mouth every 8 (eight) hours as needed (pain.).     Multiple Vitamin (MULTIVITAMIN WITH MINERALS) TABS tablet Take 1 tablet by mouth in the morning.  ondansetron  (ZOFRAN -ODT) 4 MG disintegrating tablet Take 1 tablet (4 mg total) by mouth every 8 (eight) hours as needed for nausea or vomiting. 20 tablet 0   SUMAtriptan  (IMITREX ) 50 MG tablet TAKE 1 TABLET BY MOUTH AT ONSET OF MIGRAINE. MAY REPEAT IN 2 HOURS IF HEADACHE PERSISTS OR RECURS. 10 tablet 0   valACYclovir  (VALTREX ) 1000 MG tablet Take 1 tablet (1,000 mg total) by mouth daily. 90 tablet 3   No Known Allergies  Review of Systems Constitutional: No recent fever/chills/sweats Respiratory: No recent cough/bronchitis Cardiovascular: No chest pain Gastrointestinal: No recent nausea/vomiting/diarrhea Genitourinary: No UTI symptoms Hematologic/lymphatic:No history of coagulopathy or recent blood thinner use    Objective:   Blood pressure 111/87, pulse 73, temperature 98.1 F (36.7 C), temperature source Temporal, resp. rate  18, SpO2 99%. CONSTITUTIONAL: Well-developed, well-nourished female in no acute distress.  HENT:  Normocephalic, atraumatic, External right and left ear normal. Oropharynx is clear and moist EYES: Conjunctivae and EOM are normal. No scleral icterus.  NECK: Normal range of motion, supple, no masses SKIN: Skin is warm and dry. No rash noted. Not diaphoretic. No erythema. No pallor. NEUROLOGIC: Alert and oriented to person, place, and time. Normal reflexes, muscle tone coordination. No cranial nerve deficit noted. PSYCHIATRIC: Normal mood and affect. Normal behavior. Normal judgment and thought content. CARDIOVASCULAR: Normal heart rate noted, regular rhythm RESPIRATORY: Effort and breath sounds normal, no problems with respiration noted ABDOMEN: Soft, nontender, nondistended. PELVIC: Deferred MUSCULOSKELETAL: Normal range of motion. No edema and no tenderness. 2+ distal pulses.  Labs: Results for orders placed or performed during the hospital encounter of 11/03/24 (from the past 2 weeks)  CBC   Collection Time: 11/03/24  3:10 PM  Result Value Ref Range   WBC 5.6 4.0 - 10.5 K/uL   RBC 3.71 (L) 3.87 - 5.11 MIL/uL   Hemoglobin 12.1 12.0 - 15.0 g/dL   HCT 64.5 (L) 63.9 - 53.9 %   MCV 95.4 80.0 - 100.0 fL   MCH 32.6 26.0 - 34.0 pg   MCHC 34.2 30.0 - 36.0 g/dL   RDW 87.4 88.4 - 84.4 %   Platelets 247 150 - 400 K/uL   nRBC 0.0 0.0 - 0.2 %  Basic metabolic panel   Collection Time: 11/03/24  3:10 PM  Result Value Ref Range   Sodium 140 135 - 145 mmol/L   Potassium 3.4 (L) 3.5 - 5.1 mmol/L   Chloride 103 98 - 111 mmol/L   CO2 25 22 - 32 mmol/L   Glucose, Bld 98 70 - 99 mg/dL   BUN 9 6 - 20 mg/dL   Creatinine, Ser 9.43 0.44 - 1.00 mg/dL   Calcium 8.6 (L) 8.9 - 10.3 mg/dL   GFR, Estimated >39 >39 mL/min   Anion gap 12 5 - 15  Troponin T, High Sensitivity   Collection Time: 11/03/24  3:10 PM  Result Value Ref Range   Troponin T High Sensitivity <15 0 - 19 ng/L  Troponin T, High  Sensitivity   Collection Time: 11/03/24  6:20 PM  Result Value Ref Range   Troponin T High Sensitivity <15 0 - 19 ng/L  Pregnancy, urine   Collection Time: 11/03/24  8:46 PM  Result Value Ref Range   Preg Test, Ur NEGATIVE NEGATIVE  Urinalysis, w/ Reflex to Culture (Infection Suspected) -Urine, Clean Catch   Collection Time: 11/03/24  8:48 PM  Result Value Ref Range   Specimen Source URINE, CLEAN CATCH    Color, Urine YELLOW (A) YELLOW  APPearance CLOUDY (A) CLEAR   Specific Gravity, Urine 1.011 1.005 - 1.030   pH 8.0 5.0 - 8.0   Glucose, UA NEGATIVE NEGATIVE mg/dL   Hgb urine dipstick NEGATIVE NEGATIVE   Bilirubin Urine NEGATIVE NEGATIVE   Ketones, ur NEGATIVE NEGATIVE mg/dL   Protein, ur NEGATIVE NEGATIVE mg/dL   Nitrite POSITIVE (A) NEGATIVE   Leukocytes,Ua LARGE (A) NEGATIVE   RBC / HPF 0-5 0 - 5 RBC/hpf   WBC, UA 21-50 0 - 5 WBC/hpf   Bacteria, UA RARE (A) NONE SEEN   Squamous Epithelial / HPF 11-20 0 - 5 /HPF   Mucus PRESENT   Results for orders placed or performed during the hospital encounter of 11/02/24 (from the past 2 weeks)  Basic metabolic panel   Collection Time: 11/02/24  9:32 PM  Result Value Ref Range   Sodium 143 135 - 145 mmol/L   Potassium 3.2 (L) 3.5 - 5.1 mmol/L   Chloride 110 98 - 111 mmol/L   CO2 21 (L) 22 - 32 mmol/L   Glucose, Bld 86 70 - 99 mg/dL   BUN 11 6 - 20 mg/dL   Creatinine, Ser 9.55 0.44 - 1.00 mg/dL   Calcium 7.9 (L) 8.9 - 10.3 mg/dL   GFR, Estimated >39 >39 mL/min   Anion gap 12 5 - 15  CBC   Collection Time: 11/02/24  9:32 PM  Result Value Ref Range   WBC 4.4 4.0 - 10.5 K/uL   RBC 3.56 (L) 3.87 - 5.11 MIL/uL   Hemoglobin 11.8 (L) 12.0 - 15.0 g/dL   HCT 65.7 (L) 63.9 - 53.9 %   MCV 96.1 80.0 - 100.0 fL   MCH 33.1 26.0 - 34.0 pg   MCHC 34.5 30.0 - 36.0 g/dL   RDW 87.4 88.4 - 84.4 %   Platelets 213 150 - 400 K/uL   nRBC 0.0 0.0 - 0.2 %  D-dimer, quantitative   Collection Time: 11/02/24  9:32 PM  Result Value Ref Range    D-Dimer, Quant 0.67 (H) 0.00 - 0.50 ug/mL-FEU  hCG, quantitative, pregnancy   Collection Time: 11/02/24  9:32 PM  Result Value Ref Range   hCG, Beta Chain, Quant, S <1 <5 mIU/mL  Troponin I (High Sensitivity)   Collection Time: 11/02/24  9:32 PM  Result Value Ref Range   Troponin I (High Sensitivity) <2 <18 ng/L   Imaging Studies: N/A  Pathology Studies: FINAL MICROSCOPIC DIAGNOSIS:  A. CERVIX, 11 O'CLOCK, BIOPSY: Detached squamous cells and scanty benign endocervical mucosa. Negative for dysplasia.  B. CERVIX, 4 O'CLOCK, BIOPSY: Low-grade squamous intraepithelial lesion, CIN-1 (low-grade dysplasia).  C. CERVIX, 6 O'CLOCK, BIOPSY: High-grade squamous intraepithelial lesion, CIN-2 and CIN-3 (high-grade dysplasia).  D. ENDOCERVIX, CURETTAGE: Benign endocervical mucosa.  GROSS DESCRIPTION: A: Specimen is received in formalin consists of a 0.6 cm aggregate of tan soft tissue and mucus.  The specimen is entirely submitted in 1 cassette.  B: Specimen is received in formalin and consists of a 0.5 cm piece of tan-white soft tissue.  The specimen is entirely submitted in 1 cassette.  C: The specimen is received in formalin and consists of a 0.6 cm piece of tan soft tissue.  The specimen is entirely submitted in 1 cassette.  D: The specimen is received in formalin on a wire brush, and consists of a 1.7 x 1.5 x 0.3 cm aggregate of tan-brown soft tissue and mucus.  The specimen is entirely submitted in 1 cassette.  (KL 09/11/2024)  Final Diagnosis performed  by Norleen Dover, MD.   Electronically signed 09/12/2024   Assessment:     38 y.o. [id number] with CIN-3/CIN-2 on colposcopy 09/10/24 after pap on 08/01/24 showed HSIL/HPV+ (undiff). Pt desires LEEP under sedation.       Plan:    Counseling: Procedure, risks, reasons, benefits and complications (including injury to bowel, bladder, major blood vessel, ureter, bleeding, possibility of transfusion, infection, or fistula  formation) reviewed in detail. Likelihood of success in alleviating the patient's condition was discussed. Routine postoperative instructions will be reviewed with the patient and her family in detail after surgery.  The patient concurred with the proposed plan, giving informed written consent for the surgery.   -To OR when ready -Pain medications sent to pharmacy -Plan for 2wk post-op visit     Estil Mangle, DO [facility]

## 2024-11-04 NOTE — Anesthesia Procedure Notes (Signed)
 Procedure Name: LMA Insertion Date/Time: 11/04/2024 12:41 PM  Performed by: Duwayne Craven, CRNAPre-anesthesia Checklist: Patient identified, Patient being monitored, Timeout performed, Emergency Drugs available and Suction available Patient Re-evaluated:Patient Re-evaluated prior to induction Oxygen Delivery Method: Circle system utilized Preoxygenation: Pre-oxygenation with 100% oxygen Induction Type: IV induction Ventilation: Mask ventilation without difficulty LMA: LMA inserted LMA Size: 3.0 Tube type: Oral Number of attempts: 1 Placement Confirmation: positive ETCO2 and breath sounds checked- equal and bilateral Tube secured with: Tape Dental Injury: Teeth and Oropharynx as per pre-operative assessment

## 2024-11-04 NOTE — Op Note (Signed)
 LEEP PROCEDURE NOTE  PREOPERATIVE DIAGNOSIS: CIN-3 / CIN-2 at 6:00 on colposcopy  POSTOPERATIVE DIAGNOSIS: same  PROCEDURE: LEEP   SURGEON: Lavaris Sexson, DO  IVF: Total I/O In: 700 [I.V.:700] Out: 55 [Urine:50; Blood:5]   Urine Output: 50ml  EBL: 5cc  FINDINGS: Normal, anteverted uterus, cervix 0.5cm dilated; copious white, thick vaginal discharge; decreased Lugol's uptake at 6:00 of ectocervix  SPECIMENS:  LEEP from 12:00 to 9:00 in clockwise fashion  PROCEDURE DETAILS:  CHERAL CAPPUCCI is a 38 y.o. y.o. G75P3003 female here for LEEP. No GYN concerns. Pap smear and colposcopy reviewed.    Pap: 08/01/24 HSIL, HR-HPV positive Colpo Biopsy: 09/10/24 CIN-2 and CIN-3 at 6:00; CIN-1 at 4:00 ECC: benign  Risks, benefits, alternatives, and limitations of procedure explained to patient, including pain, bleeding, infection, failure to remove abnormal tissue and failure to cure dysplasia, need for repeat procedures, damage to pelvic organs, cervical incompetence.  Role of HPV,cervical dysplasia and need for close followup was empasized. Informed written consent was obtained. All questions were answered. Time out performed.   ??Procedure: The patient was placed in lithotomy position and the bivalved coated speculum was placed in the patient's vagina. A grounding pad placed on the patient. Lugol's solution was applied to the cervix and areas of decreased uptake were noted at 6 o'clock.  Local anesthesia was administered via an intracervical block using 20 cc of 1% Lidocaine  with epinephrine . The suction was turned on and the large cone biopsy excisior on 60 Watts of cutting current was used to excise the area of decreased uptake and excise the entire transformation zone. Excellent hemostasis was achieved using roller ball coagulation set at 60 Watts coagulation current. Specimen was sent to pathology. The wire on the excisor was noted to have come loose and broken away from the pole upon removal from  the patient. Visually the entire wire appeared to be present, no pieces appeared to be missing. Pelvic x-ray was obtained and came back ***. The patient tolerated the procedure well and was taken to the recovery room in stable condition.    Estil Mangle, DO [facility]

## 2024-11-05 ENCOUNTER — Encounter: Payer: Self-pay | Admitting: Obstetrics

## 2024-11-05 NOTE — Progress Notes (Signed)
 Radiologist called Surgeon to give result of stat x-ray performed post procedure

## 2024-11-05 NOTE — Anesthesia Postprocedure Evaluation (Signed)
 Anesthesia Post Note  Patient: Latoya Luna  Procedure(s) Performed: LEEP (LOOP ELECTROSURGICAL EXCISION PROCEDURE) (Cervix)  Patient location during evaluation: PACU Anesthesia Type: General Level of consciousness: awake and alert Pain management: pain level controlled Vital Signs Assessment: post-procedure vital signs reviewed and stable Respiratory status: spontaneous breathing, nonlabored ventilation and respiratory function stable Cardiovascular status: blood pressure returned to baseline and stable Postop Assessment: no apparent nausea or vomiting Anesthetic complications: no Comments: PACU agitation treated with 1 mg midazolam . The agitation may have been emergence delirium or a psychiatric anxiety episode. Pt was shaking hiding her face. When she was asking to turn on her back, she swung her arms and did not let us  turn her. She was not following commands. She did relax but did not respond when she was told she was being treated with anxiety medications. Midazolam  was given with no more episodes.    No notable events documented.   Last Vitals:  Vitals:   11/04/24 1549 11/04/24 1604  BP: 97/63 109/75  Pulse: 70 66  Resp: 17 18  Temp:  36.7 C  SpO2: 100% 97%    Last Pain:  Vitals:   11/04/24 1604  TempSrc: Temporal  PainSc: 0-No pain                 Camellia Merilee Louder

## 2024-11-07 LAB — SURGICAL PATHOLOGY

## 2024-11-11 ENCOUNTER — Ambulatory Visit

## 2024-11-11 ENCOUNTER — Telehealth: Payer: Self-pay

## 2024-11-11 ENCOUNTER — Other Ambulatory Visit: Payer: Self-pay | Admitting: Obstetrics and Gynecology

## 2024-11-11 ENCOUNTER — Other Ambulatory Visit: Payer: Self-pay | Admitting: Obstetrics

## 2024-11-11 ENCOUNTER — Telehealth: Payer: Self-pay | Admitting: Obstetrics

## 2024-11-11 VITALS — BP 105/75 | HR 88 | Wt 145.9 lb

## 2024-11-11 DIAGNOSIS — F419 Anxiety disorder, unspecified: Secondary | ICD-10-CM

## 2024-11-11 DIAGNOSIS — Z23 Encounter for immunization: Secondary | ICD-10-CM

## 2024-11-11 NOTE — Telephone Encounter (Signed)
 Per Dr. Leigh post op appointment on 12/05/2024 is fine. Per Dr. Leigh I have informed patient not to insert anything into the vaginal canal until she is seen by Dr. Leigh at her upcoming appointment. Patient verbalized understanding.

## 2024-11-11 NOTE — Patient Instructions (Signed)
 HPV (Human Papillomavirus) Vaccine: What You Need to Know Many vaccine information statements are available in Spanish and other languages. See PromoAge.com.br. 1. Why get vaccinated? HPV (human papillomavirus) vaccine can prevent infection with some types of human papillomavirus. HPV infections can cause certain types of cancers, including: cervical, vaginal, and vulvar cancers in women penile cancer in men anal cancers in both men and women cancers of tonsils, base of tongue, and back of throat (oropharyngeal cancer) in both men and women HPV infections can also cause anogenital warts. HPV vaccine can prevent over 90% of cancers caused by HPV. HPV is spread through intimate skin-to-skin or sexual contact. HPV infections are so common that nearly all people will get at least one type of HPV at some time in their lives. Most HPV infections go away on their own within 2 years. But sometimes HPV infections will last longer and can cause cancers later in life. 2. HPV vaccine HPV vaccine is routinely recommended for adolescents at 79 or 39 years of age to ensure they are protected before they are exposed to the virus. HPV vaccine may be given beginning at age 25 years and vaccination is recommended for everyone through 39 years of age. HPV vaccine may be given to adults 27 through 39 years of age, based on discussions between the patient and health care provider. Most children who get the first dose before 66 years of age need 2 doses of HPV vaccine. People who get the first dose at or after 62 years of age and younger people with certain immunocompromising conditions need 3 doses. Your health care provider can give you more information. HPV vaccine may be given at the same time as other vaccines. 3. Talk with your health care provider Tell your vaccination provider if the person getting the vaccine: Has had an allergic reaction after a previous dose of HPV vaccine, or has any severe,  life-threatening allergies Is pregnant--HPV vaccine is not recommended until after pregnancy In some cases, your health care provider may decide to postpone HPV vaccination until a future visit. People with minor illnesses, such as a cold, may be vaccinated. People who are moderately or severely ill should usually wait until they recover before getting HPV vaccine. Your health care provider can give you more information. 4. Risks of a vaccine reaction Soreness, redness, or swelling where the shot is given can happen after HPV vaccination. Fever or headache can happen after HPV vaccination. People sometimes faint after medical procedures, including vaccination. Tell your provider if you feel dizzy or have vision changes or ringing in the ears. As with any medicine, there is a very remote chance of a vaccine causing a severe allergic reaction, other serious injury, or death. 5. What if there is a serious problem? An allergic reaction could occur after the vaccinated person leaves the clinic. If you see signs of a severe allergic reaction (hives, swelling of the face and throat, difficulty breathing, a fast heartbeat, dizziness, or weakness), call 9-1-1 and get the person to the nearest hospital. For other signs that concern you, call your health care provider. Adverse reactions should be reported to the Vaccine Adverse Event Reporting System (VAERS). Your health care provider will usually file this report, or you can do it yourself. Visit the VAERS website at www.vaers.LAgents.no or call 256-510-8455. VAERS is only for reporting reactions, and VAERS staff members do not give medical advice. 6. The National Vaccine Injury Compensation Program The [employer] Vaccine Injury Compensation Program (VICP) is a  federal program that was created to compensate people who may have been injured by certain vaccines. Claims regarding alleged injury or death due to vaccination have a time limit for filing, which may be as  short as two years. Visit the VICP website at SpiritualWord.at or call 931-622-2373 to learn about the program and about filing a claim. 7. How can I learn more? Ask your health care provider. Call your local or state health department. Visit the website of the Food and Drug Administration (FDA) for vaccine package inserts and additional information at FinderList.no. Contact the Centers for Disease Control and Prevention (CDC): Call 737-738-9397 (1-800-CDC-INFO) or Visit CDC's website at PicCapture.uy. Source: CDC Vaccine Information Statement HPV Vaccine (07/03/2020) This same material is available at FootballExhibition.com.br for no charge. This information is not intended to replace advice given to you by your health care provider. Make sure you discuss any questions you have with your health care provider. Document Revised: 03/01/2023 Document Reviewed: 12/05/2022 Elsevier Patient Education  2024 [employer].

## 2024-11-11 NOTE — Telephone Encounter (Signed)
 Received medication refill request for Lexapro  and Clonazepam .    Per Bernarda Perfect PA-C's note, okay to fill Lexapro  x one year.  Rx accidentally submitted with clonzepam also approved.  Rx did not transmit to pharmacy due to it being a controlled substance, however.  Pt informed we will no longer be prescribing Clonazepam  and she will need to establish with a PCP per her discussion with Bernarda at her annual.

## 2024-11-11 NOTE — Progress Notes (Signed)
° ° °  NURSE VISIT NOTE  Subjective:    Patient ID: Latoya Luna, female    DOB: 24-Jun-1986, 38 y.o.   MRN: [id number]  HPI  Patient is a 38 y.o. G16P3003 female Married Caucasian female who presents for her second Gardasil injection. Order to administer given by Estil Mangle, MD on 09/10/24.   Objective:    BP 105/75   Pulse 88   Wt 145 lb 14.4 oz (66.2 kg)   LMP 10/28/2024 (Approximate)   BMI 22.85 kg/m   38 y.o. LMP: 10/28/2024 approximate   Contraception:  None Given by: Rollo Maxin, CMA Site:  left deltoid  Immunization History  Administered Date(s) Administered   HPV 9-valent 09/10/2024, 11/11/2024   Influenza Inj Mdck Quad Pf 09/04/2018   Influenza, Seasonal, Injecte, Preservative Fre 09/10/2024   Influenza,inj,Quad PF,6+ Mos 09/12/2022   Tdap 01/29/2015    Lab Review  No results found for any visits on 11/11/24.    Assessment:   1. Need for HPV vaccination      Plan:   Patient will return in 4 months for third injection.    Rollo JINNY Maxin, CMA

## 2024-11-12 ENCOUNTER — Telehealth: Payer: Self-pay

## 2024-11-12 ENCOUNTER — Other Ambulatory Visit: Payer: Self-pay | Admitting: Family

## 2024-11-12 NOTE — Telephone Encounter (Signed)
 Copied from CRM #[id number]. Topic: Appointments - Scheduling Inquiry for Clinic >> Nov 12, 2024 10:19 AM Eva FALCON wrote: Reason for CRM: Pt was admitted to hospital on December 7th but discharged December 8th, she has a PCP that is retiring was looking to schedule at [facility]. Soonest is in Feb with Tabitha Dugal but decision tree states If nothing available in next 14 days to send CRM. She also has severe anxiety and only has 10 days left of medication states without medication she has panic attacks that then turn into seizures. Please call if possible to get her in sooner.

## 2024-11-12 NOTE — Telephone Encounter (Signed)
 No at this time I will not be able to extend.  As far as anxiety I see her clonazepam  has regularly been filled by her gynecology office. If she needs emergency supply she will need to go to urgent care, and or I would advise she establish with a psychiatrist for medication management as anxiety is not likely controlled as she requires daily clonazepam .

## 2024-11-13 ENCOUNTER — Encounter: Admitting: Obstetrics

## 2024-11-14 ENCOUNTER — Telehealth: Payer: Self-pay

## 2024-11-14 DIAGNOSIS — B379 Candidiasis, unspecified: Secondary | ICD-10-CM

## 2024-11-14 MED ORDER — FLUCONAZOLE 150 MG PO TABS: 150.0000 mg | ORAL_TABLET | Freq: Every day | ORAL | 0 refills | Status: DC

## 2024-11-14 NOTE — Telephone Encounter (Signed)
 Patient calling triage, UTI confirmed at ER on 11/03/24 abx given and is pretty sure she has a yeast infection. Requesting diflucan . Advised Rx would be sent and if sx persist after treatment to call our office to schedule a an appointment.

## 2024-11-14 NOTE — Telephone Encounter (Signed)
 Looks like Dr. Starla sent in RF 11/11/24.

## 2024-11-15 ENCOUNTER — Other Ambulatory Visit: Payer: Self-pay | Admitting: Obstetrics and Gynecology

## 2024-11-15 DIAGNOSIS — F419 Anxiety disorder, unspecified: Secondary | ICD-10-CM

## 2024-11-15 MED ORDER — CLONAZEPAM 1 MG PO TABS: 1.0000 mg | ORAL_TABLET | Freq: Two times a day (BID) | ORAL | 0 refills | Status: AC | PRN

## 2024-11-15 NOTE — Telephone Encounter (Signed)
 Yeast Rx sent yesterday.

## 2024-11-15 NOTE — Progress Notes (Signed)
 Rx RF clonazepam  for 1 mo till has new PCP appt. Dr. Starla had RF Rx but it was printed and not eRxd so pharm didn't get it.

## 2024-11-29 NOTE — Progress Notes (Unsigned)
" ° ° °  OBSTETRICS/GYNECOLOGY POST-OPERATIVE CLINIC VISIT  Subjective:     Latoya Luna is a 39 y.o. 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 Eating a regular diet {with-without:5700} difficulty. Bowel movements are {normal/abnormal***:19619}. {pain control:13522::The patient is not having any pain.}  {Common ambulatory SmartLinks:19316}  Review of Systems {ros; complete:30496}   Objective:   LMP 10/28/2024 (Approximate)  There is no height or weight on file to calculate BMI.  General:  alert and no distress  Abdomen: soft, bowel sounds active, non-tender  Incision:   {incision:13716::no dehiscence,incision well approximated,healing well,no drainage,no erythema,no hernia,no seroma,no swelling}    Pathology:  FINAL DIAGNOSIS        1. 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       ENDOCERVICAL GLANDULAR COLONIZATION BY HSIL IDENTIFIED.       ENDOCERVICAL MARGIN IS POSITIVE FOR HSIL.       ECTOCERVICAL MARGIN IS NEGATIVE FOR HSIL       NO EVIDENCE OF INVASIVE CARCINOMA.        Diagnosis Note : Immunohistochemical stains for p16 were performed and show       block like pattern of staining supporting the above interpretation.       Controls worked appropriately.   Assessment:   Patient s/p *** (surgery)  {doing well:13525::Doing well postoperatively.}   Plan:   1. Continue any current medications as instructed by provider. 2. Wound care discussed. 3. Operative findings again reviewed. Pathology report discussed. 4. Activity restrictions: {restrictions:13723} 5. Anticipated return to work: {work return:14002}. 6. Follow up: {8-89:86212} {time; units:18646} for ***    Vicci Rollo BRAVO, RN [facility]   "

## 2024-12-05 ENCOUNTER — Ambulatory Visit: Payer: Self-pay | Admitting: Obstetrics

## 2024-12-05 ENCOUNTER — Encounter: Admitting: Obstetrics

## 2024-12-09 NOTE — Progress Notes (Signed)
" ° ° °  OBSTETRICS/GYNECOLOGY POST-OPERATIVE CLINIC VISIT  Subjective:     Latoya Luna is a 39 y.o. 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 Eating a regular diet without difficulty. Bowel movements are normal. The patient is not having any pain.  She reports she never did see the scab fall out of her vagina.  She also reports having sex on New Years and had some cramping afterwards.  Continues to have intermittent light bleeding.  The following portions of the patient's history were reviewed and updated as appropriate: allergies, current medications, past family history, past medical history, past social history, past surgical history, and problem list.  Review of Systems Pertinent items are noted in HPI.   Objective:   BP 104/88   Pulse 61   Wt 147 lb 9.6 oz (67 kg)   LMP 10/28/2024 (Approximate)   BMI 23.12 kg/m  Body mass index is 23.12 kg/m.  General:  alert and no distress  Abdomen: soft, bowel sounds active, non-tender  Incision:   Cervix well-healed, nickel-sized region around the os that is still mildly red, without bleeding or scabbing, no exudate    Pathology:  FINAL DIAGNOSIS        1. 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       ENDOCERVICAL GLANDULAR COLONIZATION BY HSIL IDENTIFIED.       ENDOCERVICAL MARGIN IS POSITIVE FOR HSIL.       ECTOCERVICAL MARGIN IS NEGATIVE FOR HSIL       NO EVIDENCE OF INVASIVE CARCINOMA.        Diagnosis Note : Immunohistochemical stains for p16 were performed and show       block like pattern of staining supporting the above interpretation.       Controls worked appropriately.     Assessment:   Patient s/p LEEP (surgery)  Doing well postoperatively.   Plan:   1. Continue any current medications as instructed by provider. 2. Wound care discussed. 3. Operative findings again reviewed. Pathology report  discussed. Endocervical margin positive, will repeat co-testing at 6mos instead of 12. 4. Activity restrictions: none 5. Anticipated return to work: not applicable. 6. Follow up: 6mos for repeat pap, sooner prn   Estil Mangle, DO [facility]

## 2024-12-10 ENCOUNTER — Ambulatory Visit: Admitting: Obstetrics

## 2024-12-10 ENCOUNTER — Encounter: Payer: Self-pay | Admitting: Obstetrics

## 2024-12-10 ENCOUNTER — Other Ambulatory Visit (HOSPITAL_COMMUNITY)
Admission: RE | Admit: 2024-12-10 | Discharge: 2024-12-10 | Disposition: A | Discharge status: Home / Self Care | Source: Ambulatory Visit | Attending: Obstetrics | Admitting: Obstetrics

## 2024-12-10 VITALS — BP 104/88 | HR 61 | Wt 147.6 lb

## 2024-12-10 DIAGNOSIS — N898 Other specified noninflammatory disorders of vagina: Secondary | ICD-10-CM | POA: Diagnosis present

## 2024-12-12 LAB — CERVICOVAGINAL ANCILLARY ONLY
Bacterial Vaginitis (gardnerella): POSITIVE — AB
Candida Glabrata: NEGATIVE
Candida Vaginitis: POSITIVE — AB
Chlamydia: NEGATIVE
Comment: NEGATIVE
Comment: NEGATIVE
Comment: NEGATIVE
Comment: NEGATIVE
Comment: NEGATIVE
Comment: NORMAL
Neisseria Gonorrhea: NEGATIVE
Trichomonas: NEGATIVE

## 2024-12-13 ENCOUNTER — Other Ambulatory Visit: Payer: Self-pay

## 2024-12-13 DIAGNOSIS — B379 Candidiasis, unspecified: Secondary | ICD-10-CM

## 2024-12-13 DIAGNOSIS — B9689 Other specified bacterial agents as the cause of diseases classified elsewhere: Secondary | ICD-10-CM

## 2024-12-13 MED ORDER — FLUCONAZOLE 150 MG PO TABS: 150.0000 mg | ORAL_TABLET | Freq: Once | ORAL | 0 refills | Status: AC

## 2024-12-13 MED ORDER — METRONIDAZOLE 500 MG PO TABS: 500.0000 mg | ORAL_TABLET | Freq: Two times a day (BID) | ORAL | 0 refills | Status: AC

## 2024-12-13 NOTE — Progress Notes (Signed)
 Orders for BV and YI treatment.  Pt notified of need for treatment.

## 2024-12-18 ENCOUNTER — Other Ambulatory Visit: Payer: Self-pay

## 2024-12-18 DIAGNOSIS — B379 Candidiasis, unspecified: Secondary | ICD-10-CM

## 2024-12-18 MED ORDER — FLUCONAZOLE 150 MG PO TABS: 150.0000 mg | ORAL_TABLET | Freq: Every day | ORAL | 0 refills | Status: AC

## 2024-12-18 NOTE — Telephone Encounter (Signed)
 Rx for Diflucan  to treat yeast following antibiotic course.

## 2025-03-12 ENCOUNTER — Ambulatory Visit
